# Patient Record
Sex: Male | Born: 1965 | Race: White | Hispanic: No | Marital: Single | Smoking: Former smoker
Health system: Southern US, Community
[De-identification: ages and names within clinical notes are randomized; demographics above are authoritative.]

## PROBLEM LIST (undated history)

## (undated) DIAGNOSIS — R0683 Snoring: Secondary | ICD-10-CM

## (undated) DIAGNOSIS — I639 Cerebral infarction, unspecified: Secondary | ICD-10-CM

## (undated) DIAGNOSIS — I10 Essential (primary) hypertension: Secondary | ICD-10-CM

## (undated) DIAGNOSIS — D649 Anemia, unspecified: Secondary | ICD-10-CM

## (undated) DIAGNOSIS — Z87442 Personal history of urinary calculi: Secondary | ICD-10-CM

## (undated) DIAGNOSIS — M1990 Unspecified osteoarthritis, unspecified site: Secondary | ICD-10-CM

## (undated) DIAGNOSIS — K219 Gastro-esophageal reflux disease without esophagitis: Secondary | ICD-10-CM

## (undated) DIAGNOSIS — I2510 Atherosclerotic heart disease of native coronary artery without angina pectoris: Secondary | ICD-10-CM

## (undated) DIAGNOSIS — Q828 Other specified congenital malformations of skin: Secondary | ICD-10-CM

## (undated) HISTORY — PX: TONSILLECTOMY: SUR1361

## (undated) HISTORY — PX: KNEE SURGERY: SHX244

## (undated) HISTORY — DX: Atherosclerotic heart disease of native coronary artery without angina pectoris: I25.10

---

## 1990-02-04 DIAGNOSIS — I639 Cerebral infarction, unspecified: Secondary | ICD-10-CM

## 1990-02-04 DIAGNOSIS — Q828 Other specified congenital malformations of skin: Secondary | ICD-10-CM

## 1990-02-04 HISTORY — DX: Cerebral infarction, unspecified: I63.9

## 1990-02-04 HISTORY — DX: Other specified congenital malformations of skin: Q82.8

## 2009-04-11 ENCOUNTER — Ambulatory Visit: Payer: Self-pay | Admitting: Family Medicine

## 2009-05-20 DIAGNOSIS — Z709 Sex counseling, unspecified: Secondary | ICD-10-CM | POA: Insufficient documentation

## 2010-07-25 ENCOUNTER — Ambulatory Visit: Payer: Self-pay | Admitting: Family Medicine

## 2010-11-08 ENCOUNTER — Ambulatory Visit: Payer: Self-pay | Admitting: Orthopedic Surgery

## 2010-12-06 ENCOUNTER — Ambulatory Visit: Payer: Self-pay | Admitting: Orthopedic Surgery

## 2012-02-05 DIAGNOSIS — I10 Essential (primary) hypertension: Secondary | ICD-10-CM

## 2012-02-05 HISTORY — DX: Essential (primary) hypertension: I10

## 2013-02-04 DIAGNOSIS — K219 Gastro-esophageal reflux disease without esophagitis: Secondary | ICD-10-CM

## 2013-02-04 HISTORY — DX: Gastro-esophageal reflux disease without esophagitis: K21.9

## 2013-12-20 DIAGNOSIS — R0602 Shortness of breath: Secondary | ICD-10-CM | POA: Insufficient documentation

## 2013-12-20 DIAGNOSIS — R9431 Abnormal electrocardiogram [ECG] [EKG]: Secondary | ICD-10-CM | POA: Insufficient documentation

## 2013-12-20 DIAGNOSIS — R0681 Apnea, not elsewhere classified: Secondary | ICD-10-CM | POA: Insufficient documentation

## 2014-05-12 ENCOUNTER — Ambulatory Visit
Admit: 2014-05-12 | Disposition: A | Discharge status: Home / Self Care | Payer: Self-pay | Attending: Urgent Care | Admitting: Urgent Care

## 2017-02-04 DIAGNOSIS — I219 Acute myocardial infarction, unspecified: Secondary | ICD-10-CM

## 2017-02-04 HISTORY — DX: Acute myocardial infarction, unspecified: I21.9

## 2017-05-25 ENCOUNTER — Other Ambulatory Visit: Payer: Self-pay

## 2017-05-25 ENCOUNTER — Emergency Department: Payer: Self-pay

## 2017-05-25 ENCOUNTER — Inpatient Hospital Stay
Admission: EM | Admit: 2017-05-25 | Discharge: 2017-05-27 | DRG: 280 | Disposition: A | Discharge status: Other Acute Inpatient Hospital | Payer: Self-pay | Attending: Internal Medicine | Admitting: Internal Medicine

## 2017-05-25 ENCOUNTER — Encounter: Payer: Self-pay | Admitting: Emergency Medicine

## 2017-05-25 DIAGNOSIS — I739 Peripheral vascular disease, unspecified: Secondary | ICD-10-CM | POA: Diagnosis present

## 2017-05-25 DIAGNOSIS — I10 Essential (primary) hypertension: Secondary | ICD-10-CM | POA: Diagnosis present

## 2017-05-25 DIAGNOSIS — R000 Tachycardia, unspecified: Secondary | ICD-10-CM | POA: Diagnosis present

## 2017-05-25 DIAGNOSIS — Z6841 Body Mass Index (BMI) 40.0 and over, adult: Secondary | ICD-10-CM

## 2017-05-25 DIAGNOSIS — Z7982 Long term (current) use of aspirin: Secondary | ICD-10-CM

## 2017-05-25 DIAGNOSIS — Q828 Other specified congenital malformations of skin: Secondary | ICD-10-CM

## 2017-05-25 DIAGNOSIS — K219 Gastro-esophageal reflux disease without esophagitis: Secondary | ICD-10-CM | POA: Diagnosis present

## 2017-05-25 DIAGNOSIS — I110 Hypertensive heart disease with heart failure: Secondary | ICD-10-CM | POA: Diagnosis present

## 2017-05-25 DIAGNOSIS — I161 Hypertensive emergency: Secondary | ICD-10-CM

## 2017-05-25 DIAGNOSIS — I214 Non-ST elevation (NSTEMI) myocardial infarction: Principal | ICD-10-CM | POA: Diagnosis present

## 2017-05-25 DIAGNOSIS — R079 Chest pain, unspecified: Secondary | ICD-10-CM

## 2017-05-25 DIAGNOSIS — Z87891 Personal history of nicotine dependence: Secondary | ICD-10-CM

## 2017-05-25 DIAGNOSIS — I5033 Acute on chronic diastolic (congestive) heart failure: Secondary | ICD-10-CM | POA: Diagnosis present

## 2017-05-25 DIAGNOSIS — R9431 Abnormal electrocardiogram [ECG] [EKG]: Secondary | ICD-10-CM

## 2017-05-25 DIAGNOSIS — E785 Hyperlipidemia, unspecified: Secondary | ICD-10-CM | POA: Diagnosis present

## 2017-05-25 DIAGNOSIS — R0683 Snoring: Secondary | ICD-10-CM | POA: Diagnosis present

## 2017-05-25 DIAGNOSIS — Z79899 Other long term (current) drug therapy: Secondary | ICD-10-CM

## 2017-05-25 DIAGNOSIS — I252 Old myocardial infarction: Secondary | ICD-10-CM

## 2017-05-25 DIAGNOSIS — Z8249 Family history of ischemic heart disease and other diseases of the circulatory system: Secondary | ICD-10-CM

## 2017-05-25 DIAGNOSIS — G4730 Sleep apnea, unspecified: Secondary | ICD-10-CM | POA: Diagnosis present

## 2017-05-25 DIAGNOSIS — Z8673 Personal history of transient ischemic attack (TIA), and cerebral infarction without residual deficits: Secondary | ICD-10-CM

## 2017-05-25 DIAGNOSIS — Z880 Allergy status to penicillin: Secondary | ICD-10-CM

## 2017-05-25 HISTORY — DX: Morbid (severe) obesity due to excess calories: E66.01

## 2017-05-25 HISTORY — DX: Other specified congenital malformations of skin: Q82.8

## 2017-05-25 HISTORY — DX: Gastro-esophageal reflux disease without esophagitis: K21.9

## 2017-05-25 HISTORY — DX: Cerebral infarction, unspecified: I63.9

## 2017-05-25 HISTORY — DX: Snoring: R06.83

## 2017-05-25 HISTORY — DX: Essential (primary) hypertension: I10

## 2017-05-25 LAB — CBC WITH DIFFERENTIAL/PLATELET
BASOS ABS: 0 10*3/uL (ref 0–0.1)
Basophils Relative: 0 %
EOS PCT: 2 %
Eosinophils Absolute: 0.1 10*3/uL (ref 0–0.7)
HEMATOCRIT: 44.3 % (ref 40.0–52.0)
Hemoglobin: 15.1 g/dL (ref 13.0–18.0)
LYMPHS PCT: 13 %
Lymphs Abs: 1.1 10*3/uL (ref 1.0–3.6)
MCH: 29.7 pg (ref 26.0–34.0)
MCHC: 34.2 g/dL (ref 32.0–36.0)
MCV: 86.9 fL (ref 80.0–100.0)
Monocytes Absolute: 0.6 10*3/uL (ref 0.2–1.0)
Monocytes Relative: 6 %
NEUTROS ABS: 6.9 10*3/uL — AB (ref 1.4–6.5)
Neutrophils Relative %: 79 %
PLATELETS: 281 10*3/uL (ref 150–440)
RBC: 5.1 MIL/uL (ref 4.40–5.90)
RDW: 13.4 % (ref 11.5–14.5)
WBC: 8.8 10*3/uL (ref 3.8–10.6)

## 2017-05-25 LAB — APTT
APTT: 95 s — AB (ref 24–36)
aPTT: 76 seconds — ABNORMAL HIGH (ref 24–36)

## 2017-05-25 LAB — COMPREHENSIVE METABOLIC PANEL
ALBUMIN: 4.6 g/dL (ref 3.5–5.0)
ALK PHOS: 117 U/L (ref 38–126)
ALT: 43 U/L (ref 17–63)
ANION GAP: 9 (ref 5–15)
AST: 43 U/L — ABNORMAL HIGH (ref 15–41)
BUN: 12 mg/dL (ref 6–20)
CALCIUM: 9 mg/dL (ref 8.9–10.3)
CHLORIDE: 104 mmol/L (ref 101–111)
CO2: 26 mmol/L (ref 22–32)
CREATININE: 1.1 mg/dL (ref 0.61–1.24)
GFR calc Af Amer: >60 mL/min (ref >60)
GFR calc non Af Amer: >60 mL/min (ref >60)
GLUCOSE: 145 mg/dL — AB (ref 65–99)
Potassium: 3.9 mmol/L (ref 3.5–5.1)
SODIUM: 139 mmol/L (ref 135–145)
Total Bilirubin: 0.9 mg/dL (ref 0.3–1.2)
Total Protein: 8.2 g/dL — ABNORMAL HIGH (ref 6.5–8.1)

## 2017-05-25 LAB — HEPARIN LEVEL (UNFRACTIONATED): HEPARIN UNFRACTIONATED: 0.43 [IU]/mL (ref 0.30–0.70)

## 2017-05-25 LAB — LIPID PANEL
CHOL/HDL RATIO: 5.7 ratio
Cholesterol: 215 mg/dL — ABNORMAL HIGH (ref 0–200)
HDL: 38 mg/dL — ABNORMAL LOW (ref >40)
LDL CALC: 146 mg/dL — AB (ref 0–99)
TRIGLYCERIDES: 155 mg/dL — AB (ref <150)
VLDL: 31 mg/dL (ref 0–40)

## 2017-05-25 LAB — BRAIN NATRIURETIC PEPTIDE: B Natriuretic Peptide: 52 pg/mL (ref 0.0–100.0)

## 2017-05-25 LAB — TROPONIN I
Troponin I: 0.04 ng/mL (ref <0.03)
Troponin I: 3.8 ng/mL
Troponin I: 9.17 ng/mL

## 2017-05-25 LAB — PROTIME-INR
INR: 1
Prothrombin Time: 13.1 seconds (ref 11.4–15.2)

## 2017-05-25 MED ORDER — SODIUM CHLORIDE 0.9% FLUSH
3.0000 mL | Freq: Two times a day (BID) | INTRAVENOUS | Status: DC
  Administered 2017-05-25 – 2017-05-26 (×2): 3 mL via INTRAVENOUS

## 2017-05-25 MED ORDER — ASPIRIN 81 MG PO CHEW: 324.0000 mg | CHEWABLE_TABLET | ORAL | Status: AC

## 2017-05-25 MED ORDER — POTASSIUM CHLORIDE 20 MEQ PO PACK: 20.0000 meq | PACK | Freq: Every day | ORAL | Status: DC

## 2017-05-25 MED ORDER — NITROGLYCERIN IN D5W 200-5 MCG/ML-% IV SOLN
0.0000 ug/min | Freq: Once | INTRAVENOUS | Status: AC
Start: 2017-05-25 — End: 2017-05-25
  Administered 2017-05-25: 16.667 ug/min via INTRAVENOUS
  Filled 2017-05-25: qty 250

## 2017-05-25 MED ORDER — FUROSEMIDE 10 MG/ML IJ SOLN
20.0000 mg | Freq: Two times a day (BID) | INTRAMUSCULAR | Status: DC
  Administered 2017-05-25 – 2017-05-26 (×2): 20 mg via INTRAVENOUS
  Filled 2017-05-25 (×2): qty 2

## 2017-05-25 MED ORDER — HYDRALAZINE HCL 20 MG/ML IJ SOLN: 15.0000 mg | INTRAMUSCULAR | Status: DC | PRN

## 2017-05-25 MED ORDER — FENTANYL CITRATE (PF) 100 MCG/2ML IJ SOLN
INTRAMUSCULAR | Status: AC
  Administered 2017-05-25: 100 ug via INTRAVENOUS
  Filled 2017-05-25: qty 2

## 2017-05-25 MED ORDER — NITROGLYCERIN 0.4 MG SL SUBL
0.4000 mg | SUBLINGUAL_TABLET | SUBLINGUAL | Status: DC | PRN
  Administered 2017-05-25 (×2): 0.4 mg via SUBLINGUAL

## 2017-05-25 MED ORDER — LABETALOL HCL 5 MG/ML IV SOLN
5.0000 mg | Freq: Once | INTRAVENOUS | Status: AC
  Administered 2017-05-25: 5 mg via INTRAVENOUS
  Filled 2017-05-25: qty 4

## 2017-05-25 MED ORDER — NITROGLYCERIN 2 % TD OINT
1.0000 [in_us] | TOPICAL_OINTMENT | Freq: Four times a day (QID) | TRANSDERMAL | Status: DC
  Administered 2017-05-25 – 2017-05-26 (×3): 1 [in_us] via TOPICAL
  Filled 2017-05-25 (×3): qty 1

## 2017-05-25 MED ORDER — METOPROLOL TARTRATE 50 MG PO TABS
50.0000 mg | ORAL_TABLET | Freq: Two times a day (BID) | ORAL | Status: DC
  Administered 2017-05-25: 50 mg via ORAL
  Filled 2017-05-25: qty 1

## 2017-05-25 MED ORDER — PANTOPRAZOLE SODIUM 40 MG PO TBEC
40.0000 mg | DELAYED_RELEASE_TABLET | Freq: Every day | ORAL | Status: DC
  Administered 2017-05-25 – 2017-05-26 (×2): 40 mg via ORAL
  Filled 2017-05-25 (×2): qty 1

## 2017-05-25 MED ORDER — ASPIRIN 81 MG PO CHEW
324.0000 mg | CHEWABLE_TABLET | Freq: Once | ORAL | Status: AC
  Administered 2017-05-25: 324 mg via ORAL

## 2017-05-25 MED ORDER — ASPIRIN EC 81 MG PO TBEC
81.0000 mg | DELAYED_RELEASE_TABLET | Freq: Every day | ORAL | Status: DC
  Administered 2017-05-26: 81 mg via ORAL
  Filled 2017-05-25: qty 1

## 2017-05-25 MED ORDER — ACETAMINOPHEN 325 MG PO TABS: 650.0000 mg | ORAL_TABLET | ORAL | Status: DC | PRN

## 2017-05-25 MED ORDER — SODIUM CHLORIDE 0.9 % IV SOLN
INTRAVENOUS | Status: DC
  Administered 2017-05-25: 15:00:00 via INTRAVENOUS

## 2017-05-25 MED ORDER — SIMVASTATIN 20 MG PO TABS
20.0000 mg | ORAL_TABLET | Freq: Every day | ORAL | Status: DC
  Administered 2017-05-25: 20 mg via ORAL
  Filled 2017-05-25: qty 1

## 2017-05-25 MED ORDER — ASPIRIN 300 MG RE SUPP: 300.0000 mg | RECTAL | Status: AC

## 2017-05-25 MED ORDER — HEPARIN (PORCINE) IN NACL 100-0.45 UNIT/ML-% IJ SOLN
1600.0000 [IU]/h | INTRAMUSCULAR | Status: DC
  Administered 2017-05-26: 1600 [IU]/h via INTRAVENOUS
  Filled 2017-05-25 (×3): qty 250

## 2017-05-25 MED ORDER — ONDANSETRON HCL 4 MG/2ML IJ SOLN: 4.0000 mg | Freq: Four times a day (QID) | INTRAMUSCULAR | Status: DC | PRN

## 2017-05-25 MED ORDER — HEPARIN SODIUM (PORCINE) 5000 UNIT/ML IJ SOLN
4000.0000 [IU] | Freq: Once | INTRAMUSCULAR | Status: AC
  Administered 2017-05-25: 4000 [IU] via INTRAVENOUS
  Filled 2017-05-25: qty 1

## 2017-05-25 MED ORDER — ACETAMINOPHEN 500 MG PO TABS: 1000.0000 mg | ORAL_TABLET | Freq: Once | ORAL | Status: DC

## 2017-05-25 MED ORDER — IOPAMIDOL (ISOVUE-370) INJECTION 76%
100.0000 mL | Freq: Once | INTRAVENOUS | Status: AC | PRN
  Administered 2017-05-25: 100 mL via INTRAVENOUS

## 2017-05-25 MED ORDER — FENTANYL CITRATE (PF) 100 MCG/2ML IJ SOLN
100.0000 ug | INTRAMUSCULAR | Status: DC | PRN
  Administered 2017-05-25: 100 ug via INTRAVENOUS

## 2017-05-25 MED ORDER — LISINOPRIL 20 MG PO TABS
20.0000 mg | ORAL_TABLET | Freq: Two times a day (BID) | ORAL | Status: DC
  Administered 2017-05-25: 20 mg via ORAL
  Filled 2017-05-25: qty 1

## 2017-05-25 MED ORDER — ENOXAPARIN SODIUM 40 MG/0.4ML ~~LOC~~ SOLN: 40.0000 mg | SUBCUTANEOUS | Status: DC

## 2017-05-25 MED ORDER — ALPRAZOLAM 0.25 MG PO TABS: 0.2500 mg | ORAL_TABLET | Freq: Two times a day (BID) | ORAL | Status: DC | PRN

## 2017-05-25 MED ORDER — HEPARIN (PORCINE) IN NACL 100-0.45 UNIT/ML-% IJ SOLN
14.0000 [IU]/kg/h | INTRAMUSCULAR | Status: DC
  Administered 2017-05-25: 14 [IU]/kg/h via INTRAVENOUS

## 2017-05-25 NOTE — ED Notes (Signed)
Late entry: at 1710 report called to [facility]. Nitro at 28ml/hr changed by dr Roxan Hockey at approx 1600 per dr. Roxan Hockey for the pt's continued high blood pressure.

## 2017-05-25 NOTE — Progress Notes (Signed)
ANTICOAGULATION CONSULT NOTE - Initial Consult  Pharmacy Consult for heparin gtt Indication: chest pain/ACS  Allergies  Allergen Reactions  . Penicillins Other (See Comments)    Has patient had a PCN reaction causing immediate rash, facial/tongue/throat swelling, SOB or lightheadedness with hypotension: Unknown Has patient had a PCN reaction causing severe rash involving mucus membranes or skin necrosis: Unknown Has patient had a PCN reaction that required hospitalization: Unknown Has patient had a PCN reaction occurring within the last 10 years: Unknown If all of the above answers are "NO", then may proceed with Cephalosporin use.     Patient Measurements: Height: 5\' 10"  (177.8 cm) Weight: 299 lb 11.2 oz (135.9 kg) IBW/kg (Calculated) : 73 Heparin Dosing Weight: 104kg  Vital Signs: Temp: 97.8 F (36.6 C) (04/21 2002) Temp Source: Oral (04/21 2002) BP: 116/66 (04/21 2002) Pulse Rate: 76 (04/21 2002)  Labs: Recent Labs    05/25/17 1355 05/25/17 1507 05/25/17 1735 05/25/17 2207  HGB  --  15.1  --   --   HCT  --  44.3  --   --   PLT  --  281  --   --   APTT  --  95*  --  76*  LABPROT  --  13.1  --   --   INR  --  1.00  --   --   HEPARINUNFRC  --   --   --  0.43  CREATININE 1.10  --   --   --   TROPONINI 0.04*  --  3.80* 9.17*    Estimated Creatinine Clearance: 109.1 mL/min (by C-G formula based on SCr of 1.1 mg/dL).   Medical History: Past Medical History:  Diagnosis Date  . PXE (pseudoxanthoma elasticum)     Medications:  Medications Prior to Admission  Medication Sig Dispense Refill Last Dose  . aspirin EC 81 MG tablet Take 81 mg by mouth once.   05/25/2017 at 1300  . omeprazole (PRILOSEC) 20 MG capsule Take 20 mg by mouth daily as needed (acid reflux.).   PRN at PRN  . ranitidine (ZANTAC) 150 MG tablet Take 150 mg by mouth 2 (two) times daily.   05/25/2017 at 0800   Scheduled:  . acetaminophen  1,000 mg Oral Once  . aspirin  324 mg Oral NOW   Or  .  aspirin  300 mg Rectal NOW  . [START ON 05/26/2017] aspirin EC  81 mg Oral Daily  . furosemide  20 mg Intravenous Q12H  . lisinopril  20 mg Oral BID  . metoprolol tartrate  50 mg Oral BID  . nitroGLYCERIN  1 inch Topical Q6H  . pantoprazole  40 mg Oral Daily  . [START ON 05/26/2017] potassium chloride  20 mEq Oral Daily  . simvastatin  20 mg Oral q1800  . sodium chloride flush  3 mL Intravenous Q12H   Infusions:  . sodium chloride 20 mL/hr at 05/25/17 1511  . heparin 1,400 Units/hr (05/25/17 1736)   PRN: acetaminophen, ALPRAZolam, fentaNYL (SUBLIMAZE) injection, hydrALAZINE, nitroGLYCERIN, ondansetron (ZOFRAN) IV Anti-infectives (From admission, onward)   None      Assessment: 52 year old male with ACS requiring heparin gtt. Patient does not take any anticoagulants PTA however elevated apTT which was likely due to heparin bolus given at 1428. However will get aptt with evening HL to verify.   Goal of Therapy:  Heparin level 0.3-0.7 units/ml Monitor platelets by anticoagulation protocol: Yes   Plan:  Give 4000 units bolus x 1 Start  heparin infusion at 1400 units/hr Check anti-Xa level in 6 hours and daily while on heparin Continue to monitor H&H and platelets   4/21 PM heparin level 0.43, aPTT 76. Continue current regimen. Recheck heparin level and CBC with tomorrow AM labs.  Joselyne Spake S 05/25/2017,11:06 PM

## 2017-05-25 NOTE — ED Provider Notes (Signed)
[facility] Emergency Department Provider Note    None    (approximate)  I have reviewed the triage vital signs and the nursing notes.   HISTORY  Chief Complaint Code STEMI    HPI Jon Garner. is a 52 y.o. 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  Has never had symptoms like this before.  Denies any recent fevers.  No numbness or tingling.  No pain radiating through to his back.  States that the pain is mild to moderate.  Past Medical History:  Diagnosis Date  . PXE (pseudoxanthoma elasticum)    No family history on file. Past Surgical History:  Procedure Laterality Date  . KNEE SURGERY     There are no active problems to display for this patient.     Prior to Admission medications   Medication Sig Start Date End Date Taking? Authorizing Provider  aspirin EC 81 MG tablet Take 81 mg by mouth once.   Yes [provider]  omeprazole (PRILOSEC) 20 MG capsule Take 20 mg by mouth daily as needed (acid reflux.).   Yes [provider]  ranitidine (ZANTAC) 150 MG tablet Take 150 mg by mouth 2 (two) times daily.   Yes [provider]    Allergies Penicillins    Social History Social History   Tobacco Use  . Smoking status: Former Games developer  . Smokeless tobacco: Never Used  Substance Use Topics  . Alcohol use: Yes    Comment: Rare  . Drug use: Never    Review of Systems Patient denies headaches, rhinorrhea, blurry vision, numbness, shortness of breath, chest pain, edema, cough, abdominal pain, nausea, vomiting, diarrhea, dysuria, fevers, rashes or hallucinations unless otherwise stated above in  HPI. ____________________________________________   PHYSICAL EXAM:  VITAL SIGNS: Vitals:   05/25/17 1508 05/25/17 1515  BP: (!) 180/111 (!) 191/103  Pulse: 99 100  Resp: 17 15  SpO2: 95% (!) 87%    Constitutional: Alert and oriented. critically ill appearing and diaphoretic Eyes: Conjunctivae are normal.  Head: Atraumatic. Nose: No congestion/rhinnorhea. Mouth/Throat: Mucous membranes are moist.   Neck: No stridor. Painless ROM.  Cardiovascular: Normal rate, regular rhythm. Grossly normal heart sounds.  Good peripheral circulation. Respiratory: Normal respiratory effort.  No retractions. Lungs CTAB. Gastrointestinal: Soft and nontender. No distention. No abdominal bruits. No CVA tenderness. Genitourinary:  Musculoskeletal: No lower extremity tenderness nor edema.  No joint effusions. Neurologic:  Normal speech and language. No gross focal neurologic deficits are appreciated. No facial droop Skin:  Skin is warm, dry and intact. No rash noted. Psychiatric: anxious appearing   ____________________________________________   LABS (all labs ordered are listed, but only abnormal results are displayed)  Results for orders placed or performed during the hospital encounter of 05/25/17 (from the past 24 hour(s))  Comprehensive metabolic panel     Status: Abnormal   Collection Time: 05/25/17  1:55 PM  Result Value Ref Range   Sodium 139 135 - 145 mmol/L   Potassium 3.9 3.5 - 5.1 mmol/L   Chloride 104 101 - 111 mmol/L   CO2 26 22 - 32 mmol/L   Glucose, Bld 145 (H) 65 - 99 mg/dL   BUN 12 6 - 20 mg/dL   Creatinine, Ser 3.84 0.61 -  1.24 mg/dL   Calcium 9.0 8.9 - 16.1 mg/dL   Total Protein 8.2 (H) 6.5 - 8.1 g/dL   Albumin 4.6 3.5 - 5.0 g/dL   AST 43 (H) 15 - 41 U/L   ALT 43 17 - 63 U/L   Alkaline Phosphatase 117 38 - 126 U/L   Total Bilirubin 0.9 0.3 - 1.2 mg/dL   GFR calc non Af Amer >60 >60 mL/min   GFR calc Af Amer >60 >60 mL/min   Anion gap 9 5 - 15  Troponin I     Status:  Abnormal   Collection Time: 05/25/17  1:55 PM  Result Value Ref Range   Troponin I 0.04 (HH) <0.03 ng/mL  Lipid panel     Status: Abnormal   Collection Time: 05/25/17  1:55 PM  Result Value Ref Range   Cholesterol 215 (H) 0 - 200 mg/dL   Triglycerides 096 (H) <150 mg/dL   HDL 38 (L) >04 mg/dL   Total CHOL/HDL Ratio 5.7 RATIO   VLDL 31 0 - 40 mg/dL   LDL Cholesterol 540 (H) 0 - 99 mg/dL   ____________________________________________  EKG My review and personal interpretation at Time: 14:01   Indication: chest pain  Rate: 115  Rhythm: sinus Axis: normal Other: ST elevation in aVr>V1 with diffuse st depressions   My review and personal interpretation at Time: 14:02 Indication: chest pain  Rate: 115  Rhythm: sinus Axis: normal Other: ST elevation in aVr>V1 with diffuse st depressions  My review and personal interpretation at Time:   14:19 Indication: chest pain  Rate: 115  Rhythm: sinus Axis: normal Other: posterior leads show no STEMI  ____________________________________________  RADIOLOGY  I personally reviewed all radiographic images ordered to evaluate for the above acute complaints and reviewed radiology reports and findings.  These findings were personally discussed with the patient.  Please see medical record for radiology report.  ____________________________________________   PROCEDURES  Procedure(s) performed:  .Critical Care Performed by: Willy Eddy, MD Authorized by: Willy Eddy, MD   Critical care provider statement:    Critical care time (minutes):  40   Critical care time was exclusive of:  Separately billable procedures and treating other patients   Critical care was necessary to treat or prevent imminent or life-threatening deterioration of the following conditions:  Cardiac failure   Critical care was time spent personally by me on the following activities:  Development of treatment plan with patient or surrogate, discussions with  consultants, evaluation of patient's response to treatment, examination of patient, obtaining history from patient or surrogate, ordering and performing treatments and interventions, ordering and review of laboratory studies, ordering and review of radiographic studies, pulse oximetry, re-evaluation of patient's condition and review of old charts      Critical Care performed: yes ____________________________________________   INITIAL IMPRESSION / ASSESSMENT AND PLAN / ED COURSE  Pertinent labs & imaging results that were available during my care of the patient were reviewed by me and considered in my medical decision making (see chart for details).  DDX: ACS, pericarditis, esophagitis, boerhaaves, pe, dissection, pna, bronchitis, costochondritis   Jon Garner. is a 52 y.o. who presents to the ED with chest pain as described above.  Patient without any pain ripping or tearing through his back but the patient is diaphoretic and profoundly hypertensive.  EKG with concerning ST changes but no true STEMI criteria at this time.  We will treat blood pressure as well as order CT angiogram to rule  out dissection.  Will provide IV pain medication.  The patient will be placed on continuous pulse oximetry and telemetry for monitoring.  Laboratory evaluation will be sent to evaluate for the above complaints.     Clinical Course as of May 25 1521  Sun May 25, 2017  1427 Patient with markedly elevated blood pressure.  Posterior leads show no evidence of ST elevation.  Will continue with IV blood pressure management will order CT angiogram to evaluate for dissection.   [PR]  1454 CT angiogram does not show any evidence of dissection.  Pain improving with blood pressure control and IV pain medication.   [PR]  1506 Patient reassessed and is currently pain-free.  BP improving on nitro drip.  No longer diaphoretic   [PR]  1515 Discussed case with Dr. Graciela Husbands of cardiology who agrees with current  medical therapy at this point.  Patient was remains pain-free.  No evidence of dissection.  Most likely symptoms secondary to long-standing hypertension with acute hypertensive emergency.  Patient will likely require cardiac catheterization to evaluate for underlying CAD but does not currently meet criteria for emergent catheterization.    Have discussed with the patient and available family all diagnostics and treatments performed thus far and all questions were answered to the best of my ability. The patient demonstrates understanding and agreement with plan.    [PR]  1520 APTT [PR]    Clinical Course User Index [PR] Willy Eddy, MD     As part of my medical decision making, I reviewed the following data within the electronic MEDICAL RECORD NUMBER Nursing notes reviewed and incorporated, Labs reviewed, notes from prior ED visits.  ____________________________________________   FINAL CLINICAL IMPRESSION(S) / ED DIAGNOSES  Final diagnoses:  Hypertensive emergency  Abnormal EKG      NEW MEDICATIONS STARTED DURING THIS VISIT:  New Prescriptions   No medications on file     Note:  This document was prepared using Dragon voice recognition software and may include unintentional dictation errors.    Willy Eddy, MD 05/25/17 [phone]

## 2017-05-25 NOTE — H&P (Addendum)
[facility] - [place] [facility]   PATIENT NAME: Jon Garner    MR#:  [id number]  DATE OF BIRTH:  1965/09/14  DATE OF ADMISSION:  05/25/2017  PRIMARY CARE PHYSICIAN: Patient, No Pcp Per   REQUESTING/REFERRING PHYSICIAN:   CHIEF COMPLAINT:   Chief Complaint  Patient presents with  . Code STEMI    HISTORY OF PRESENT ILLNESS: Jon Garner  is a 52 y.o. 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  Old inferior MI, patient evaluated emergency room, continues to complain of chest pain-down to 1-2 out of 10 from 10 out of 10, patient is now been admitted for acute malignant hypertension, acute elevated troponins for which non-STEMI cannot be ruled out.  PAST MEDICAL HISTORY:   Past Medical History:  Diagnosis Date  . PXE (pseudoxanthoma elasticum)     PAST SURGICAL HISTORY:  Past Surgical History:  Procedure Laterality Date  . KNEE SURGERY      SOCIAL HISTORY:  Social History   Tobacco Use  . Smoking status: Former Games developer  . Smokeless tobacco: Never Used  Substance Use Topics  . Alcohol use: Yes    Comment: Rare    FAMILY HISTORY:  HTN CAD  DRUG ALLERGIES:  Allergies  Allergen Reactions  . Penicillins Other (See Comments)    Has patient had a PCN reaction causing  immediate rash, facial/tongue/throat swelling, SOB or lightheadedness with hypotension: Unknown Has patient had a PCN reaction causing severe rash involving mucus membranes or skin necrosis: Unknown Has patient had a PCN reaction that required hospitalization: Unknown Has patient had a PCN reaction occurring within the last 10 years: Unknown If all of the above answers are "NO", then may proceed with Cephalosporin use.     REVIEW OF SYSTEMS:   CONSTITUTIONAL: No fever, +fatigue, weakness.  EYES: No blurred or double vision.  EARS, NOSE, AND THROAT: No tinnitus or ear pain.  RESPIRATORY: No cough, +shortness of breath, no wheezing or hemoptysis.  CARDIOVASCULAR: + chest pain, no orthopnea, edema.  GASTROINTESTINAL: + nausea, no vomiting, diarrhea or abdominal pain.  GENITOURINARY: No dysuria, hematuria.  ENDOCRINE: No polyuria, nocturia,  HEMATOLOGY: No anemia, easy bruising or bleeding SKIN: No rash or lesion. MUSCULOSKELETAL: No joint pain or arthritis.   NEUROLOGIC: No tingling, numbness, weakness.  PSYCHIATRY: No anxiety or depression.   MEDICATIONS AT HOME:  Prior to Admission medications   Medication Sig Start Date End Date Taking? Authorizing Provider  aspirin EC 81 MG tablet Take 81 mg by mouth once.   Yes [provider]  omeprazole (PRILOSEC) 20 MG capsule Take 20 mg by mouth daily as needed (acid reflux.).   Yes [provider]  ranitidine (ZANTAC) 150 MG tablet Take 150 mg by mouth 2 (two) times daily.   Yes [provider]      PHYSICAL EXAMINATION:   VITAL SIGNS: Blood pressure (!) 191/103, pulse 100, resp. rate 15, height 5\' 10"  (1.778 m), weight 136.1 kg (300 lb), SpO2 Marland Kitchen)  87 %.  GENERAL:  52 y.o.-year-old patient lying in the bed with no acute distress.  Extreme morbid obesity EYES: Pupils equal, round, reactive to light and accommodation. No scleral icterus. Extraocular muscles intact.  HEENT: Head atraumatic, normocephalic.  Oropharynx and nasopharynx clear.  NECK:  Supple, no jugular venous distention. No thyroid enlargement, no tenderness.  LUNGS: Normal breath sounds bilaterally, no wheezing, rales,rhonchi or crepitation. No use of accessory muscles of respiration.  CARDIOVASCULAR: S1, S2 normal. No murmurs, rubs, or gallops.  ABDOMEN: Soft, nontender, nondistended. Bowel sounds present. No organomegaly or mass.  EXTREMITIES: No pedal edema, cyanosis, or clubbing.  NEUROLOGIC: Cranial nerves II through XII are intact. MAES. Gait not checked.  PSYCHIATRIC: The patient is alert and oriented x 3.  SKIN: No obvious rash, lesion, or ulcer.   LABORATORY PANEL:   CBC Recent Labs  Lab 05/25/17 1507  WBC 8.8  HGB 15.1  HCT 44.3  PLT 281  MCV 86.9  MCH 29.7  MCHC 34.2  RDW 13.4  LYMPHSABS 1.1  MONOABS 0.6  EOSABS 0.1  BASOSABS 0.0   ------------------------------------------------------------------------------------------------------------------  Chemistries  Recent Labs  Lab 05/25/17 1355  NA 139  K 3.9  CL 104  CO2 26  GLUCOSE 145*  BUN 12  CREATININE 1.10  CALCIUM 9.0  AST 43*  ALT 43  ALKPHOS 117  BILITOT 0.9   ------------------------------------------------------------------------------------------------------------------ estimated creatinine clearance is 109.1 mL/min (by C-G formula based on SCr of 1.1 mg/dL). ------------------------------------------------------------------------------------------------------------------ No results for input(s): TSH, T4TOTAL, T3FREE, THYROIDAB in the last 72 hours.  Invalid input(s): FREET3   Coagulation profile Recent Labs  Lab 05/25/17 1507  INR 1.00   ------------------------------------------------------------------------------------------------------------------- No results for input(s): DDIMER in the last 72  hours. -------------------------------------------------------------------------------------------------------------------  Cardiac Enzymes Recent Labs  Lab 05/25/17 1355  TROPONINI 0.04*   ------------------------------------------------------------------------------------------------------------------ Invalid input(s): POCBNP  ---------------------------------------------------------------------------------------------------------------  Urinalysis No results found for: COLORURINE, APPEARANCEUR, LABSPEC, PHURINE, GLUCOSEU, HGBUR, BILIRUBINUR, KETONESUR, PROTEINUR, UROBILINOGEN, NITRITE, LEUKOCYTESUR   RADIOLOGY: Dg Chest Port 1 View  Result Date: 05/25/2017 CLINICAL DATA:  Sudden onset severe chest pain.  Ex-smoker. EXAM: PORTABLE CHEST 1 VIEW COMPARISON:  04/11/2009. FINDINGS: Interval mildly enlarged cardiac silhouette and mild prominence of the pulmonary vasculature and interstitial markings. No visible pleural fluid. No acute bony abnormality. IMPRESSION: Interval mild cardiomegaly and mild changes of congestive heart failure. Electronically Signed   By: Beckie Salts M.D.   On: 05/25/2017 14:43   Ct Angio Chest Aorta W And/or Wo Contrast  Result Date: 05/25/2017 CLINICAL DATA:  Sudden onset severe chest pain. Severe diaphoresis. Ex-smoker. EXAM: CT ANGIOGRAPHY CHEST WITH CONTRAST TECHNIQUE: Multidetector CT imaging of the chest was performed using the standard protocol during bolus administration of intravenous contrast. Multiplanar CT image reconstructions and MIPs were obtained to evaluate the vascular anatomy. CONTRAST:  ISOVUE-370 IOPAMIDOL (ISOVUE-370) INJECTION 76% COMPARISON:  Portable chest radiograph obtained today. FINDINGS: Cardiovascular: Satisfactory opacification of the pulmonary arteries to the segmental level. No evidence of pulmonary embolism. Normal heart size. No pericardial effusion. No aortic dissection or aneurysm. Multi-vessel coronary artery calcification.  Mediastinum/Nodes: No enlarged mediastinal, hilar, or axillary lymph nodes. Thyroid gland, trachea, and esophagus demonstrate no significant findings. Lungs/Pleura: Clear lungs. No pleural fluid. No nodules. Minimally prominent interstitial markings with peripheral Kerley lines bilaterally. Upper Abdomen: Diffuse low density of the liver relative to the spleen. Musculoskeletal: Extensive thoracic and lower cervical spine degenerative changes. Review of the MIP images confirms the above findings. IMPRESSION: 1. No pulmonary emboli, aortic dissection or aortic  aneurysm. 2. Multi-vessel atheromatous coronary artery calcifications. 3. Minimal interstitial pulmonary edema/chronic interstitial lung disease. 4. Diffuse hepatic steatosis. Electronically Signed   By: Beckie Salts M.D.   On: 05/25/2017 15:10    EKG: Orders placed or performed during the hospital encounter of 05/25/17  . EKG 12-Lead  . EKG 12-Lead  . EKG 12-Lead  . EKG 12-Lead  . ED EKG  . ED EKG    IMPRESSION AND PLAN: 1 acute malignant hypertension Improved with IV fentanyl/IV Lasix/IV labetalol in the emergency room Start Lopressor twice daily, lisinopril twice daily, IV hydralazine as needed systolic blood pressure greater than 150, scheduled Nitropaste to chest, vitals per routine, make changes as per necessary  2 acute elevated troponins/possible non-STEMI with flash pulmonary edema May represent demand ischemia from above Admit to telemetry bed on our ACS protocol, continue heparin drip, continue to cycle cardiac enzymes, cardiology to see, Lopressor twice daily, lisinopril, aspirin, statin therapy-lipids noted, supplemental oxygen as needed, IV morphine as needed breakthrough pain, echocardiogram, if rules out will proceed with nuclear medicine stress testing in the morning, good blood pressure control per above, and continue close medical monitoring  3 acute newly diagnosed congestive heart failure exacerbation Most likely  secondary to diastolic dysfunction, exacerbated by above IV Lasix twice daily, lisinopril, aspirin, Lopressor, low-sodium/cardiac diet, good blood pressure control per above, echocardiogram, cardiology to see  4 chronic extreme morbid obesity Lifestyle modification recommended  5 hyperlipidemia with peripheral vascular disease Start statin therapy   6 chronic peripheral vascular disease Stable Noted history of chronic left ICA occlusion, occluded right side intracranial vessel-unknown location Aspirin and statin therapy as stated above    All the records are reviewed and case discussed with ED provider. Management plans discussed with the patient, family and they are in agreement.  CODE STATUS:full    TOTAL TIME TAKING CARE OF THIS PATIENT: 45 minutes of critical care time was used to discuss plan of care with subspecialty staff, nursing staff, the patient with all questions answered.    Evelena Asa Teofila Bowery M.D on 05/25/2017   Between 7am to 6pm - Pager - [phone]  After 6pm go to [email or web address] - password [email or web address]  [facility] [place] [facility]  Office  [phone]  CC: Primary care physician; Patient, No Pcp Per   Note: This dictation was prepared with Dragon dictation along with smaller phrase technology. Any transcriptional errors that result from this process are unintentional.

## 2017-05-25 NOTE — ED Notes (Signed)
Pt back from ct

## 2017-05-25 NOTE — Progress Notes (Signed)
ANTICOAGULATION CONSULT NOTE - Initial Consult  Pharmacy Consult for heparin gtt Indication: chest pain/ACS  Allergies  Allergen Reactions  . Penicillins Other (See Comments)    Has patient had a PCN reaction causing immediate rash, facial/tongue/throat swelling, SOB or lightheadedness with hypotension: Unknown Has patient had a PCN reaction causing severe rash involving mucus membranes or skin necrosis: Unknown Has patient had a PCN reaction that required hospitalization: Unknown Has patient had a PCN reaction occurring within the last 10 years: Unknown If all of the above answers are "NO", then may proceed with Cephalosporin use.     Patient Measurements: Height: 5\' 10"  (177.8 cm) Weight: 300 lb (136.1 kg) IBW/kg (Calculated) : 73 Heparin Dosing Weight: 104kg  Vital Signs: BP: 191/103 (04/21 1515) Pulse Rate: 100 (04/21 1515)  Labs: Recent Labs    05/25/17 1355 05/25/17 1507  HGB  --  15.1  HCT  --  44.3  PLT  --  281  APTT  --  95*  LABPROT  --  13.1  INR  --  1.00  CREATININE 1.10  --   TROPONINI 0.04*  --     Estimated Creatinine Clearance: 109.1 mL/min (by C-G formula based on SCr of 1.1 mg/dL).   Medical History: Past Medical History:  Diagnosis Date  . PXE (pseudoxanthoma elasticum)     Medications:   (Not in a hospital admission) Scheduled:  . acetaminophen  1,000 mg Oral Once  . labetalol  5 mg Intravenous Once   Infusions:  . sodium chloride 20 mL/hr at 05/25/17 1511  . heparin     PRN: fentaNYL (SUBLIMAZE) injection, nitroGLYCERIN Anti-infectives (From admission, onward)   None      Assessment: 52 year old male with ACS requiring heparin gtt. Patient does not take any anticoagulants PTA however elevated apTT which was likely due to heparin bolus given at 1428. However will get aptt with evening HL to verify.   Goal of Therapy:  Heparin level 0.3-0.7 units/ml Monitor platelets by anticoagulation protocol: Yes   Plan:  Give 4000  units bolus x 1 Start heparin infusion at 1400 units/hr Check anti-Xa level in 6 hours and daily while on heparin Continue to monitor H&H and platelets  Gerre Pebbles Gergory Biello 05/25/2017,3:54 PM

## 2017-05-25 NOTE — ED Triage Notes (Signed)
Pt presents to ED via POV with c/o sudden onset severe chest pain, upon arrival to ED pt is noted to be severely diaphoretic. Pt taken to triage 2 by this RN upon his arrival to ED, code STEMI called by Dr. Roxan Hockey.

## 2017-05-26 ENCOUNTER — Inpatient Hospital Stay (HOSPITAL_COMMUNITY)
Admit: 2017-05-26 | Discharge: 2017-05-26 | Disposition: A | Discharge status: Home / Self Care | Payer: Self-pay | Attending: Cardiovascular Disease | Admitting: Cardiovascular Disease

## 2017-05-26 ENCOUNTER — Encounter
Admission: EM | Disposition: A | Discharge status: Other Acute Inpatient Hospital | Payer: Self-pay | Source: Home / Self Care | Attending: Internal Medicine

## 2017-05-26 ENCOUNTER — Encounter: Payer: Self-pay | Admitting: *Deleted

## 2017-05-26 DIAGNOSIS — I10 Essential (primary) hypertension: Secondary | ICD-10-CM

## 2017-05-26 DIAGNOSIS — I214 Non-ST elevation (NSTEMI) myocardial infarction: Secondary | ICD-10-CM

## 2017-05-26 DIAGNOSIS — F17200 Nicotine dependence, unspecified, uncomplicated: Secondary | ICD-10-CM

## 2017-05-26 DIAGNOSIS — I2510 Atherosclerotic heart disease of native coronary artery without angina pectoris: Secondary | ICD-10-CM

## 2017-05-26 HISTORY — PX: LEFT HEART CATH AND CORONARY ANGIOGRAPHY: CATH118249

## 2017-05-26 LAB — CBC
HEMATOCRIT: 40.7 % (ref 40.0–52.0)
HEMOGLOBIN: 14.1 g/dL (ref 13.0–18.0)
MCH: 30.4 pg (ref 26.0–34.0)
MCHC: 34.6 g/dL (ref 32.0–36.0)
MCV: 87.8 fL (ref 80.0–100.0)
Platelets: 270 10*3/uL (ref 150–440)
RBC: 4.64 MIL/uL (ref 4.40–5.90)
RDW: 13.5 % (ref 11.5–14.5)
WBC: 10.3 10*3/uL (ref 3.8–10.6)

## 2017-05-26 LAB — BASIC METABOLIC PANEL
Anion gap: 8 (ref 5–15)
BUN: 12 mg/dL (ref 6–20)
CALCIUM: 8.1 mg/dL — AB (ref 8.9–10.3)
CHLORIDE: 106 mmol/L (ref 101–111)
CO2: 24 mmol/L (ref 22–32)
CREATININE: 0.99 mg/dL (ref 0.61–1.24)
GFR calc Af Amer: >60 mL/min (ref >60)
GFR calc non Af Amer: >60 mL/min (ref >60)
Glucose, Bld: 118 mg/dL — ABNORMAL HIGH (ref 65–99)
Potassium: 3.5 mmol/L (ref 3.5–5.1)
Sodium: 138 mmol/L (ref 135–145)

## 2017-05-26 LAB — ECHOCARDIOGRAM COMPLETE
Height: 70 in
Weight: 4729.6 oz

## 2017-05-26 LAB — HEPARIN LEVEL (UNFRACTIONATED): HEPARIN UNFRACTIONATED: 0.22 [IU]/mL — AB (ref 0.30–0.70)

## 2017-05-26 LAB — TROPONIN I: Troponin I: 8.21 ng/mL (ref <0.03)

## 2017-05-26 SURGERY — LEFT HEART CATH AND CORONARY ANGIOGRAPHY
Anesthesia: Moderate Sedation

## 2017-05-26 MED ORDER — IOPAMIDOL (ISOVUE-300) INJECTION 61%
INTRAVENOUS | Status: DC | PRN
  Administered 2017-05-26: 120 mL via INTRA_ARTERIAL

## 2017-05-26 MED ORDER — SODIUM CHLORIDE 0.9 % IV SOLN: 250.0000 mL | INTRAVENOUS | Status: DC | PRN

## 2017-05-26 MED ORDER — MIDAZOLAM HCL 2 MG/2ML IJ SOLN
INTRAMUSCULAR | Status: DC | PRN
  Administered 2017-05-26: 1 mg via INTRAVENOUS

## 2017-05-26 MED ORDER — NITROGLYCERIN 2 % TD OINT
TOPICAL_OINTMENT | TRANSDERMAL | Status: DC | PRN
  Administered 2017-05-26: 2 [in_us] via TOPICAL

## 2017-05-26 MED ORDER — LISINOPRIL 20 MG PO TABS
20.0000 mg | ORAL_TABLET | Freq: Every day | ORAL | Status: DC
  Administered 2017-05-26: 20 mg via ORAL
  Filled 2017-05-26: qty 1

## 2017-05-26 MED ORDER — FENTANYL CITRATE (PF) 100 MCG/2ML IJ SOLN
INTRAMUSCULAR | Status: AC
  Filled 2017-05-26: qty 2

## 2017-05-26 MED ORDER — POTASSIUM CHLORIDE CRYS ER 20 MEQ PO TBCR
20.0000 meq | EXTENDED_RELEASE_TABLET | Freq: Every day | ORAL | Status: DC
  Administered 2017-05-26: 20 meq via ORAL
  Filled 2017-05-26: qty 1

## 2017-05-26 MED ORDER — SODIUM CHLORIDE 0.9% FLUSH: 3.0000 mL | Freq: Two times a day (BID) | INTRAVENOUS | Status: DC

## 2017-05-26 MED ORDER — HEPARIN BOLUS VIA INFUSION
1500.0000 [IU] | Freq: Once | INTRAVENOUS | Status: AC
  Administered 2017-05-26: 1500 [IU] via INTRAVENOUS
  Filled 2017-05-26: qty 1500

## 2017-05-26 MED ORDER — ATORVASTATIN CALCIUM 20 MG PO TABS
80.0000 mg | ORAL_TABLET | Freq: Every day | ORAL | Status: DC
  Administered 2017-05-26: 80 mg via ORAL
  Filled 2017-05-26 (×2): qty 4

## 2017-05-26 MED ORDER — HEPARIN (PORCINE) IN NACL 1000-0.9 UT/500ML-% IV SOLN
INTRAVENOUS | Status: AC
  Filled 2017-05-26: qty 1000

## 2017-05-26 MED ORDER — NITROGLYCERIN 2 % TD OINT
TOPICAL_OINTMENT | TRANSDERMAL | Status: AC
  Filled 2017-05-26: qty 2

## 2017-05-26 MED ORDER — LISINOPRIL 20 MG PO TABS: 20.0000 mg | ORAL_TABLET | Freq: Every day | ORAL | Status: DC

## 2017-05-26 MED ORDER — MIDAZOLAM HCL 2 MG/2ML IJ SOLN
INTRAMUSCULAR | Status: AC
Start: 2017-05-26 — End: 2017-05-26
  Filled 2017-05-26: qty 2

## 2017-05-26 MED ORDER — METOPROLOL TARTRATE 25 MG PO TABS
25.0000 mg | ORAL_TABLET | Freq: Two times a day (BID) | ORAL | Status: DC
  Administered 2017-05-26 (×2): 25 mg via ORAL
  Filled 2017-05-26 (×2): qty 1

## 2017-05-26 MED ORDER — SODIUM CHLORIDE 0.9% FLUSH: 3.0000 mL | INTRAVENOUS | Status: DC | PRN

## 2017-05-26 MED ORDER — HEPARIN (PORCINE) IN NACL 100-0.45 UNIT/ML-% IJ SOLN
1600.0000 [IU]/h | INTRAMUSCULAR | Status: DC
  Administered 2017-05-26: 1600 [IU]/h via INTRAVENOUS

## 2017-05-26 MED ORDER — LIDOCAINE HCL (PF) 1 % IJ SOLN
INTRAMUSCULAR | Status: AC
  Filled 2017-05-26: qty 30

## 2017-05-26 MED ORDER — SODIUM CHLORIDE 0.9 % WEIGHT BASED INFUSION
3.0000 mL/kg/h | INTRAVENOUS | Status: DC
  Administered 2017-05-26: 3 mL/kg/h via INTRAVENOUS

## 2017-05-26 MED ORDER — SODIUM CHLORIDE 0.9 % WEIGHT BASED INFUSION: 1.0000 mL/kg/h | INTRAVENOUS | Status: DC

## 2017-05-26 MED ORDER — METOPROLOL TARTRATE 25 MG PO TABS: 25.0000 mg | ORAL_TABLET | Freq: Two times a day (BID) | ORAL | Status: DC

## 2017-05-26 MED ORDER — ACETAMINOPHEN 325 MG PO TABS: 650.0000 mg | ORAL_TABLET | ORAL | Status: DC | PRN

## 2017-05-26 MED ORDER — SODIUM CHLORIDE 0.9 % WEIGHT BASED INFUSION
1.0000 mL/kg/h | INTRAVENOUS | Status: AC
  Administered 2017-05-26: 1 mL/kg/h via INTRAVENOUS

## 2017-05-26 MED ORDER — ONDANSETRON HCL 4 MG/2ML IJ SOLN: 4.0000 mg | Freq: Four times a day (QID) | INTRAMUSCULAR | Status: DC | PRN

## 2017-05-26 MED ORDER — POTASSIUM CHLORIDE CRYS ER 20 MEQ PO TBCR: 20.0000 meq | EXTENDED_RELEASE_TABLET | Freq: Every day | ORAL | Status: DC

## 2017-05-26 MED ORDER — FENTANYL CITRATE (PF) 100 MCG/2ML IJ SOLN
INTRAMUSCULAR | Status: DC | PRN
  Administered 2017-05-26: 50 ug via INTRAVENOUS

## 2017-05-26 MED ORDER — ASPIRIN 81 MG PO CHEW: 81.0000 mg | CHEWABLE_TABLET | ORAL | Status: DC

## 2017-05-26 MED ORDER — ATORVASTATIN CALCIUM 80 MG PO TABS: 80.0000 mg | ORAL_TABLET | Freq: Every day | ORAL | Status: DC

## 2017-05-26 SURGICAL SUPPLY — 10 items
CATH INFINITI 5FR ANG PIGTAIL (CATHETERS) ×3 IMPLANT
CATH INFINITI 5FR JL4 (CATHETERS) ×3 IMPLANT
CATH INFINITI JR4 5F (CATHETERS) ×3 IMPLANT
DEVICE CLOSURE MYNXGRIP 5F (Vascular Products) ×3 IMPLANT
KIT MANI 3VAL PERCEP (MISCELLANEOUS) ×3 IMPLANT
NEEDLE PERC 18GX7CM (NEEDLE) ×3 IMPLANT
NEEDLE SMART REG 18GX2-3/4 (NEEDLE) ×3 IMPLANT
PACK CARDIAC CATH (CUSTOM PROCEDURE TRAY) ×3 IMPLANT
SHEATH AVANTI 5FR X 11CM (SHEATH) ×3 IMPLANT
WIRE GUIDERIGHT .035X150 (WIRE) ×3 IMPLANT

## 2017-05-26 NOTE — Progress Notes (Signed)
CRITICAL VALUE ALERT  Critical Value: Troponin 3.8 Date & Time Notied:  05/25/17/1735 Provider Notified: Dr. Katheren Shams  Orders Received/Actions taken: none, patient is on heparin drip

## 2017-05-26 NOTE — Progress Notes (Signed)
[facility] - [place] at [facility]   PATIENT NAME: Jon Garner    MR#:  [id number]  DATE OF BIRTH:  1965-11-24  SUBJECTIVE:  CHIEF COMPLAINT:   Chief Complaint  Patient presents with  . Code STEMI   No chest pain today.  Has fatigue Heparin drip.  REVIEW OF SYSTEMS:    Review of Systems  Constitutional: Positive for malaise/fatigue. Negative for chills and fever.  HENT: Negative for sore throat.   Eyes: Negative for blurred vision, double vision and pain.  Respiratory: Positive for shortness of breath. Negative for cough, hemoptysis and wheezing.   Cardiovascular: Positive for chest pain. Negative for palpitations, orthopnea and leg swelling.  Gastrointestinal: Negative for abdominal pain, constipation, diarrhea, heartburn, nausea and vomiting.  Genitourinary: Negative for dysuria and hematuria.  Musculoskeletal: Negative for back pain and joint pain.  Skin: Negative for rash.  Neurological: Negative for sensory change, speech change, focal weakness and headaches.  Endo/Heme/Allergies: Does not bruise/bleed easily.  Psychiatric/Behavioral: Negative for depression. The patient is not nervous/anxious.     DRUG ALLERGIES:   Allergies  Allergen Reactions  . Penicillins Other (See Comments)    Has patient had a PCN reaction causing immediate rash, facial/tongue/throat swelling, SOB or lightheadedness with hypotension: Unknown Has patient had a PCN reaction causing severe rash involving mucus membranes or skin necrosis: Unknown Has patient had a PCN reaction that required hospitalization: Unknown Has patient had a PCN reaction occurring within the last 10 years: Unknown If all of the above answers are "NO", then may proceed with Cephalosporin use.     VITALS:  Blood pressure (!) 170/91, pulse 84, temperature 98 F (36.7 C), temperature source Oral, resp. rate 18, height 5\' 8"  (1.727 m), weight 133.8 kg (295 lb), SpO2 94 %.  PHYSICAL EXAMINATION:    Physical Exam  GENERAL:  52 y.o.-year-old patient lying in the bed with no acute distress.  EYES: Pupils equal, round, reactive to light and accommodation. No scleral icterus. Extraocular muscles intact.  HEENT: Head atraumatic, normocephalic. Oropharynx and nasopharynx clear.  NECK:  Supple, no jugular venous distention. No thyroid enlargement, no tenderness.  LUNGS: Normal breath sounds bilaterally, no wheezing, rales, rhonchi. No use of accessory muscles of respiration.  CARDIOVASCULAR: S1, S2 normal. No murmurs, rubs, or gallops.  ABDOMEN: Soft, nontender, nondistended. Bowel sounds present. No organomegaly or mass.  EXTREMITIES: No cyanosis, clubbing or edema b/l.    NEUROLOGIC: Cranial nerves II through XII are intact. No focal Motor or sensory deficits b/l.   PSYCHIATRIC: The patient is alert and oriented x 3.  SKIN: No obvious rash, lesion, or ulcer.   LABORATORY PANEL:   CBC Recent Labs  Lab 05/26/17 0522  WBC 10.3  HGB 14.1  HCT 40.7  PLT 270   ------------------------------------------------------------------------------------------------------------------ Chemistries  Recent Labs  Lab 05/25/17 1355 05/26/17 0522  NA 139 138  K 3.9 3.5  CL 104 106  CO2 26 24  GLUCOSE 145* 118*  BUN 12 12  CREATININE 1.10 0.99  CALCIUM 9.0 8.1*  AST 43*  --   ALT 43  --   ALKPHOS 117  --   BILITOT 0.9  --    ------------------------------------------------------------------------------------------------------------------  Cardiac Enzymes Recent Labs  Lab 05/26/17 0522  TROPONINI 8.21*   ------------------------------------------------------------------------------------------------------------------  RADIOLOGY:  Dg Chest Port 1 View  Result Date: 05/25/2017 CLINICAL DATA:  Sudden onset severe chest pain.  Ex-smoker. EXAM: PORTABLE CHEST 1 VIEW COMPARISON:  04/11/2009. FINDINGS: Interval mildly enlarged cardiac silhouette and mild  prominence of the pulmonary  vasculature and interstitial markings. No visible pleural fluid. No acute bony abnormality. IMPRESSION: Interval mild cardiomegaly and mild changes of congestive heart failure. Electronically Signed   By: Beckie Salts M.D.   On: 05/25/2017 14:43   Ct Angio Chest Aorta W And/or Wo Contrast  Result Date: 05/25/2017 CLINICAL DATA:  Sudden onset severe chest pain. Severe diaphoresis. Ex-smoker. EXAM: CT ANGIOGRAPHY CHEST WITH CONTRAST TECHNIQUE: Multidetector CT imaging of the chest was performed using the standard protocol during bolus administration of intravenous contrast. Multiplanar CT image reconstructions and MIPs were obtained to evaluate the vascular anatomy. CONTRAST:  ISOVUE-370 IOPAMIDOL (ISOVUE-370) INJECTION 76% COMPARISON:  Portable chest radiograph obtained today. FINDINGS: Cardiovascular: Satisfactory opacification of the pulmonary arteries to the segmental level. No evidence of pulmonary embolism. Normal heart size. No pericardial effusion. No aortic dissection or aneurysm. Multi-vessel coronary artery calcification. Mediastinum/Nodes: No enlarged mediastinal, hilar, or axillary lymph nodes. Thyroid gland, trachea, and esophagus demonstrate no significant findings. Lungs/Pleura: Clear lungs. No pleural fluid. No nodules. Minimally prominent interstitial markings with peripheral Kerley lines bilaterally. Upper Abdomen: Diffuse low density of the liver relative to the spleen. Musculoskeletal: Extensive thoracic and lower cervical spine degenerative changes. Review of the MIP images confirms the above findings. IMPRESSION: 1. No pulmonary emboli, aortic dissection or aortic aneurysm. 2. Multi-vessel atheromatous coronary artery calcifications. 3. Minimal interstitial pulmonary edema/chronic interstitial lung disease. 4. Diffuse hepatic steatosis. Electronically Signed   By: Beckie Salts M.D.   On: 05/25/2017 15:10     ASSESSMENT AND PLAN:   * NSTEMI Aspirin, heparin drip.  Beta-blocker.   Lipitor Cardiac catheterization later today.  Echocardiogram done and pending.  *Accelerated hypertension.  On beta-blocker and ACE inhibitor.  Improving.  *Sleep apnea.  Will need outpatient sleep study.  *Hyperlipidemia.  On statin.  All the records are reviewed and case discussed with Care Management/Social Workerr. Management plans discussed with the patient, family and they are in agreement.  CODE STATUS: Full code  DVT Prophylaxis: SCDs  TOTAL TIME TAKING CARE OF THIS PATIENT: 35 minutes.   POSSIBLE D/C IN 1-2 DAYS, DEPENDING ON CLINICAL CONDITION.  Orie Fisherman M.D on 05/26/2017 at 11:12 AM  Between 7am to 6pm - Pager - [phone]  After 6pm go to [email or web address] - password [email or web address]  [facility] [place] [facility]  Office  [phone]  CC: Primary care physician; Patient, No Pcp Per  Note: This dictation was prepared with Dragon dictation along with smaller phrase technology. Any transcriptional errors that result from this process are unintentional.

## 2017-05-26 NOTE — Discharge Summary (Signed)
[facility] - [place] at [facility]   PATIENT NAME: Jon Garner    MR#:  [id number]  DATE OF BIRTH:  1966/01/07  DATE OF ADMISSION:  05/25/2017 ADMITTING PHYSICIAN: Bertrum Sol, MD  DATE OF DISCHARGE: 05/26/2017  PRIMARY CARE PHYSICIAN: Patient, No Pcp Per   ADMISSION DIAGNOSIS:  Abnormal EKG [R94.31] Hypertensive emergency [I16.1] Chest pain, unspecified type [R07.9]  DISCHARGE DIAGNOSIS:  Active Problems:   Malignant hypertension   SECONDARY DIAGNOSIS:   Past Medical History:  Diagnosis Date  . Essential hypertension   . GERD (gastroesophageal reflux disease)   . Morbid obesity (HCC)   . PXE (pseudoxanthoma elasticum)   . Snores   . Stroke [facility])    a. Age 36     ADMITTING HISTORY  HISTORY OF PRESENT ILLNESS: Jon Garner  is a 52 y.o. 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  Old inferior MI, patient evaluated emergency room, continues to complain of chest pain-down to 1-2 out of 10 from 10 out of 10, patient is now been admitted for acute malignant hypertension, acute elevated troponins for which non-STEMI cannot be ruled out.    HOSPITAL COURSE:   * NSTEMI Admitted to tele floor and had heparin drip, ASA, BB, Statin. Tele monitoring. Significant increase in  troponin  Cardiac cath showed Severe three vessel disease,  Proximal, mid and distal LAD Proximal to mid LCX Prox and distal RCA Normal LV function  Transfer to [facility] for CABG.  * Malignant HTN Improved with meds  Stabilized and will be tansffered to [facility] cone   CONSULTS OBTAINED:  Treatment Team:  Duke Salvia, MD  DRUG ALLERGIES:   Allergies  Allergen Reactions  . Penicillins Other (See Comments)    Has patient had a PCN reaction causing immediate rash, facial/tongue/throat swelling, SOB or lightheadedness with hypotension: Unknown Has patient had a PCN reaction causing severe rash involving mucus membranes or skin necrosis: Unknown Has patient had a PCN reaction that required hospitalization: Unknown Has patient had a PCN reaction occurring within the last 10 years: Unknown If all of the above answers are "NO", then may proceed with Cephalosporin use.     DISCHARGE MEDICATIONS:   Allergies as of 05/26/2017      Reactions   Penicillins Other (See Comments)   Has patient had a PCN reaction causing immediate rash, facial/tongue/throat swelling, SOB or lightheadedness with hypotension: Unknown Has patient had a PCN reaction causing severe rash involving mucus membranes or skin necrosis: Unknown Has patient had a PCN reaction that required hospitalization: Unknown Has patient had a PCN reaction occurring within the last 10 years: Unknown If all of the above answers are "NO", then may proceed with Cephalosporin use.      Medication List    TAKE these medications   aspirin EC 81 MG tablet Take 81 mg by mouth once.   atorvastatin 80 MG tablet Commonly known as:  LIPITOR Take 1 tablet (80 mg total) by mouth  daily at 6 PM.   lisinopril 20 MG tablet Commonly known as:  PRINIVIL,ZESTRIL Take 1 tablet (20 mg total) by mouth daily. Start taking on:  05/27/2017   metoprolol tartrate 25 MG tablet Commonly known as:  LOPRESSOR Take 1 tablet (25 mg total) by  mouth 2 (two) times daily.   omeprazole 20 MG capsule Commonly known as:  PRILOSEC Take 20 mg by mouth daily as needed (acid reflux.).   potassium chloride SA 20 MEQ tablet Commonly known as:  K-DUR,KLOR-CON Take 1 tablet (20 mEq total) by mouth daily.   ranitidine 150 MG tablet Commonly known as:  ZANTAC Take 150 mg by mouth 2 (two) times daily.       Today   VITAL SIGNS:  Blood pressure (!) 160/81, pulse 70, temperature 98 F (36.7 C), temperature source Oral, resp. rate 19, height 5\' 8"  (1.727 m), weight 133.8 kg (295 lb), SpO2 93 %.  I/O:    Intake/Output Summary (Last 24 hours) at 05/26/2017 1527 Last data filed at 05/26/2017 0805 Gross per 24 hour  Intake 515.31 ml  Output 1700 ml  Net -1184.69 ml    PHYSICAL EXAMINATION:  Physical Exam  GENERAL:  52 y.o.-year-old patient lying in the bed with no acute distress.  LUNGS: Normal breath sounds bilaterally, no wheezing, rales,rhonchi or crepitation. No use of accessory muscles of respiration.  CARDIOVASCULAR: S1, S2 normal. No murmurs, rubs, or gallops.  ABDOMEN: Soft, non-tender, non-distended. Bowel sounds present. No organomegaly or mass.  NEUROLOGIC: Moves all 4 extremities. PSYCHIATRIC: The patient is alert and oriented x 3.  SKIN: No obvious rash, lesion, or ulcer.   DATA REVIEW:   CBC Recent Labs  Lab 05/26/17 0522  WBC 10.3  HGB 14.1  HCT 40.7  PLT 270    Chemistries  Recent Labs  Lab 05/25/17 1355 05/26/17 0522  NA 139 138  K 3.9 3.5  CL 104 106  CO2 26 24  GLUCOSE 145* 118*  BUN 12 12  CREATININE 1.10 0.99  CALCIUM 9.0 8.1*  AST 43*  --   ALT 43  --   ALKPHOS 117  --   BILITOT 0.9  --     Cardiac Enzymes Recent Labs  Lab 05/26/17 0522  TROPONINI 8.21*    Microbiology Results  No results found for this or any previous visit.  RADIOLOGY:  Dg Chest Port 1 View  Result Date: 05/25/2017 CLINICAL DATA:  Sudden onset severe chest pain.  Ex-smoker. EXAM: PORTABLE CHEST 1 VIEW  COMPARISON:  04/11/2009. FINDINGS: Interval mildly enlarged cardiac silhouette and mild prominence of the pulmonary vasculature and interstitial markings. No visible pleural fluid. No acute bony abnormality. IMPRESSION: Interval mild cardiomegaly and mild changes of congestive heart failure. Electronically Signed   By: Beckie Salts M.D.   On: 05/25/2017 14:43   Ct Angio Chest Aorta W And/or Wo Contrast  Result Date: 05/25/2017 CLINICAL DATA:  Sudden onset severe chest pain. Severe diaphoresis. Ex-smoker. EXAM: CT ANGIOGRAPHY CHEST WITH CONTRAST TECHNIQUE: Multidetector CT imaging of the chest was performed using the standard protocol during bolus administration of intravenous contrast. Multiplanar CT image reconstructions and MIPs were obtained to evaluate the vascular anatomy. CONTRAST:  ISOVUE-370 IOPAMIDOL (ISOVUE-370) INJECTION 76% COMPARISON:  Portable chest radiograph obtained today. FINDINGS: Cardiovascular: Satisfactory opacification of the pulmonary arteries to the segmental level. No evidence of pulmonary embolism. Normal heart size. No pericardial effusion. No aortic dissection or aneurysm. Multi-vessel coronary artery calcification. Mediastinum/Nodes: No enlarged mediastinal, hilar, or axillary lymph  nodes. Thyroid gland, trachea, and esophagus demonstrate no significant findings. Lungs/Pleura: Clear lungs. No pleural fluid. No nodules. Minimally prominent interstitial markings with peripheral Kerley lines bilaterally. Upper Abdomen: Diffuse low density of the liver relative to the spleen. Musculoskeletal: Extensive thoracic and lower cervical spine degenerative changes. Review of the MIP images confirms the above findings. IMPRESSION: 1. No pulmonary emboli, aortic dissection or aortic aneurysm. 2. Multi-vessel atheromatous coronary artery calcifications. 3. Minimal interstitial pulmonary edema/chronic interstitial lung disease. 4. Diffuse hepatic steatosis. Electronically Signed   By: Beckie Salts M.D.   On: 05/25/2017 15:10    Follow up with PCP in 1 week.  Management plans discussed with the patient, family and they are in agreement.  CODE STATUS:     Code Status Orders  (From admission, onward)        Start     Ordered   05/26/17 1339  Full code  Continuous     05/26/17 1338    Code Status History    Date Active Date Inactive Code Status Order ID Comments User Context   05/25/2017 1726 05/26/2017 1338 Full Code [id number]  Salary, Evelena Asa, MD Inpatient      TOTAL TIME TAKING CARE OF THIS PATIENT ON DAY OF DISCHARGE: more than 30 minutes.   Orie Fisherman M.D on 05/26/2017 at 3:27 PM  Between 7am to 6pm - Pager - [phone]  After 6pm go to [email or web address] - password [email or web address]  [facility] [place] [facility]  Office  [phone]  CC: Primary care physician; Patient, No Pcp Per  Note: This dictation was prepared with Dragon dictation along with smaller phrase technology. Any transcriptional errors that result from this process are unintentional.

## 2017-05-26 NOTE — Consult Note (Addendum)
Cardiology Consult    Patient ID: Jon Garner. MRN: [id number], DOB/AGE: 1965/02/08   Admit date: 05/25/2017 Date of Consult: 05/26/2017  Primary Physician: Patient, No Pcp Per Primary Cardiologist: Julien Nordmann, MD Requesting Provider: Kathie Rhodes. Sudini, MD  Patient Profile    Jon Garner. is a 53 y.o. male with a history of chest pain, DOE, knee pain, obesity, GERD, HTN, PXE, and remote stroke (felt to be related to PXE), who is being seen today for the evaluation of NSTEMI at the request of Dr. Elpidio Anis.  Past Medical History   Past Medical History:  Diagnosis Date  . Essential hypertension   . GERD (gastroesophageal reflux disease)   . Morbid obesity (HCC)   . PXE (pseudoxanthoma elasticum)   . Snores   . Stroke [facility])    a. Age 31    Past Surgical History:  Procedure Laterality Date  . KNEE SURGERY Bilateral      Allergies  Allergies  Allergen Reactions  . Penicillins Other (See Comments)    Has patient had a PCN reaction causing immediate rash, facial/tongue/throat swelling, SOB or lightheadedness with hypotension: Unknown Has patient had a PCN reaction causing severe rash involving mucus membranes or skin necrosis: Unknown Has patient had a PCN reaction that required hospitalization: Unknown Has patient had a PCN reaction occurring within the last 10 years: Unknown If all of the above answers are "NO", then may proceed with Cephalosporin use.     History of Present Illness    52 year old male with a prior history of hypertension, obesity, remote tobacco abuse, obesity, GERD, PXE, and remote stroke at age 11 which was felt to be secondary to PXE.  He lives locally and works as a Investment banker, operational.  He is not very active in the setting of bilateral knee pain and prior surgeries.  In late 2015, he was evaluated by cardiology secondary to exertional dyspnea, diaphoresis, and chest pain.  He underwent stress testing which was reportedly normal.  He was diagnosed with GERD  and placed on Prilosec.  He says that following that, he had intermittent episodes of exertional diaphoresis and dyspnea that  occurred almost exclusively while he was up and cooking in the kitchen and moving around.  He would typically the sit and take a Zantac with relief of symptoms in 10 to 15 minutes.  This would occur fairly frequently but he always talked up to GERD.  Over the past 3 to 4 weeks, symptoms have been occurring on a regular basis, up to multiple times per day, lasting 10 to 15 minutes, and resolving with rest and/or Zantac.  Spells are often preceded by a coughing fit  diaphoresis  dyspnea.  He does not usually have chest discomfort but yesterday, he was cooking macaroni and cheese for a birthday party and had sudden onset of severe substernal chest heaviness described as "an elephant on [his] chest."  He sat and took Zantac and aspirin without relief.  He then presented to the emergency department where ECG was notable for sinus tachycardia, inferolateral ST depression, and mild ST elevation in leads aVR and V1.  Initial troponin was mildly elevated at 0.04 but has since risen and peaked at 9.17.  He is 8.21 this morning.  CTA chest neg for PE. Multivessel coronary Ca2+ noted. He is currently chest pain-free.  He is n.p.o. and pending catheterization.  Inpatient Medications    . acetaminophen  1,000 mg Oral Once  . aspirin  324 mg Oral NOW  Or  . aspirin  300 mg Rectal NOW  . aspirin EC  81 mg Oral Daily  . furosemide  20 mg Intravenous Q12H  . lisinopril  20 mg Oral Daily  . metoprolol tartrate  25 mg Oral BID  . pantoprazole  40 mg Oral Daily  . potassium chloride  20 mEq Oral Daily  . simvastatin  20 mg Oral q1800  . sodium chloride flush  3 mL Intravenous Q12H    Family History    Family History  Problem Relation Age of Onset  . Dementia Mother   . Other Father        died in his 20's  . COPD Father   . Multiple sclerosis Sister    indicated that his mother is  alive. He indicated that his father is deceased. He indicated that his sister is alive. He indicated that his brother is alive.   Social History    Social History   Socioeconomic History  . Marital status: Single    Spouse name: Not on file  . Number of children: Not on file  . Years of education: Not on file  . Highest education level: Not on file  Occupational History  . Occupation: Chef  Social Needs  . Financial resource strain: Not on file  . Food insecurity:    Worry: Not on file    Inability: Not on file  . Transportation needs:    Medical: Not on file    Non-medical: Not on file  Tobacco Use  . Smoking status: Former Smoker    Packs/day: 1.50    Years: 10.00    Pack years: 15.00    Types: Cigarettes    Last attempt to quit: 12/05/2013    Years since quitting: 3.4  . Smokeless tobacco: Never Used  Substance and Sexual Activity  . Alcohol use: Yes    Comment: a few drinks/year  . Drug use: Never  . Sexual activity: Not on file  Lifestyle  . Physical activity:    Days per week: Not on file    Minutes per session: Not on file  . Stress: Not on file  Relationships  . Social connections:    Talks on phone: Not on file    Gets together: Not on file    Attends religious service: Not on file    Active member of club or organization: Not on file    Attends meetings of clubs or organizations: Not on file    Relationship status: Not on file  . Intimate partner violence:    Fear of current or ex partner: Not on file    Emotionally abused: Not on file    Physically abused: Not on file    Forced sexual activity: Not on file  Other Topics Concern  . 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  Does not routinely exercise.     Review of Systems    General:  No chills, fever, night sweats or weight changes.  Cardiovascular:  +++ exertional chest pain, +++ dyspnea on exertion, no edema, orthopnea, palpitations, paroxysmal nocturnal  dyspnea. Dermatological: No rash, lesions/masses Respiratory: +++ cough, +++ dyspnea Urologic: No hematuria, dysuria Abdominal:   No nausea, vomiting, diarrhea, bright red blood per rectum, melena, or hematemesis Neurologic:  No visual changes, wkns, changes in mental status. All other systems reviewed and are otherwise negative except as noted above.  Physical Exam    Blood pressure 130/67, pulse 75, temperature  97.9 F (36.6 C), resp. rate 18, height 5\' 10"  (1.778 m), weight 295 lb 9.6 oz (134.1 kg), SpO2 100 %.  General: Pleasant, NAD Psych: Normal affect. Neuro: Alert and oriented X 3. Moves all extremities spontaneously. HEENT: Normal  Neck: Supple, obese, without bruits or JVD. Lungs:  Resp regular and unlabored, CTA. Heart: RRR no s3, s4, or murmurs. Abdomen: Soft, non-tender, non-distended, BS + x 4.  Extremities: No clubbing, cyanosis or edema. DP/PT/Radials 2+ and equal bilaterally.  Labs   Recent Labs    05/25/17 1355 05/25/17 1735 05/25/17 2207 05/26/17 0522  TROPONINI 0.04* 3.80* 9.17* 8.21*   Lab Results  Component Value Date   WBC 10.3 05/26/2017   HGB 14.1 05/26/2017   HCT 40.7 05/26/2017   MCV 87.8 05/26/2017   PLT 270 05/26/2017    Recent Labs  Lab 05/25/17 1355 05/26/17 0522  NA 139 138  K 3.9 3.5  CL 104 106  CO2 26 24  BUN 12 12  CREATININE 1.10 0.99  CALCIUM 9.0 8.1*  PROT 8.2*  --   BILITOT 0.9  --   ALKPHOS 117  --   ALT 43  --   AST 43*  --   GLUCOSE 145* 118*   Lab Results  Component Value Date   CHOL 215 (H) 05/25/2017   HDL 38 (L) 05/25/2017   LDLCALC 146 (H) 05/25/2017   TRIG 155 (H) 05/25/2017    Radiology Studies    Dg Chest Port 1 View  Result Date: 05/25/2017 CLINICAL DATA:  Sudden onset severe chest pain.  Ex-smoker. EXAM: PORTABLE CHEST 1 VIEW COMPARISON:  04/11/2009. FINDINGS: Interval mildly enlarged cardiac silhouette and mild prominence of the pulmonary vasculature and interstitial markings. No visible pleural  fluid. No acute bony abnormality. IMPRESSION: Interval mild cardiomegaly and mild changes of congestive heart failure. Electronically Signed   By: Beckie Salts M.D.   On: 05/25/2017 14:43   Ct Angio Chest Aorta W And/or Wo Contrast  Result Date: 05/25/2017 CLINICAL DATA:  Sudden onset severe chest pain. Severe diaphoresis. Ex-smoker. EXAM: CT ANGIOGRAPHY CHEST WITH CONTRAST TECHNIQUE: Multidetector CT imaging of the chest was performed using the standard protocol during bolus administration of intravenous contrast. Multiplanar CT image reconstructions and MIPs were obtained to evaluate the vascular anatomy. CONTRAST:  ISOVUE-370 IOPAMIDOL (ISOVUE-370) INJECTION 76% COMPARISON:  Portable chest radiograph obtained today. FINDINGS: Cardiovascular: Satisfactory opacification of the pulmonary arteries to the segmental level. No evidence of pulmonary embolism. Normal heart size. No pericardial effusion. No aortic dissection or aneurysm. Multi-vessel coronary artery calcification. Mediastinum/Nodes: No enlarged mediastinal, hilar, or axillary lymph nodes. Thyroid gland, trachea, and esophagus demonstrate no significant findings. Lungs/Pleura: Clear lungs. No pleural fluid. No nodules. Minimally prominent interstitial markings with peripheral Kerley lines bilaterally. Upper Abdomen: Diffuse low density of the liver relative to the spleen. Musculoskeletal: Extensive thoracic and lower cervical spine degenerative changes. Review of the MIP images confirms the above findings. IMPRESSION: 1. No pulmonary emboli, aortic dissection or aortic aneurysm. 2. Multi-vessel atheromatous coronary artery calcifications. 3. Minimal interstitial pulmonary edema/chronic interstitial lung disease. 4. Diffuse hepatic steatosis. Electronically Signed   By: Beckie Salts M.D.   On: 05/25/2017 15:10    ECG & Cardiac Imaging    ST, 114, up to 2mm inflat ST dep w/ ST elev in aVR/aVL.  Assessment & Plan    1.  Non-ST segment  elevation myocardial infarction: With prior history of exertional dyspnea and diaphoresis dating back to at least 2015 with apparent  negative stress test at that time.  He presented to the emergency department on April 21 with a 3 to 4-week history of more progressive symptoms followed by severe substernal chest heaviness on the day of admission.  Initial troponin was mildly elevated and he is subsequently ruled in, peaking his troponin at 9.17.  He is currently chest pain-free and troponin is trending down.  Continue aspirin, heparin, beta-blocker, ACE inhibitor, and statin-though I will increase potency of statin to Lipitor 80.  He will undergo diagnostic catheterization today. The patient understands that risks include but are not limited to stroke (1 in 1000), death (1 in 1000), kidney failure [usually temporary] (1 in 500), bleeding (1 in 200), allergic reaction [possibly serious] (1 in 200), and agrees to proceed.  Eventual cardiac rehab.  2.  Untreated hypertension: Continue beta-blocker and ACE inhibitor.  Currently stable.  3.  Hyperlipidemia: LDL 146.  Will change to Lipitor 80 mg.  4.  GERD: Continue PPI.  5.  Morbid obesity: Eventual cardiac rehab.  Activity limited secondary to chronic knee pain.  6.  Snoring: He will require outpatient sleep study.  7.  PXE: He is on chronic aspirin and vitamin D therapy at home.  Increased risk for CV dzs due to Ca2+.  See above.  8.  Hypokalemia:  Supplementation ordered.  Signed, Nicolasa Ducking, NP 05/26/2017, 9:47 AM  For questions or updates, please contact   Please consult [email or web address] for contact info under Cardiology/STEMI.

## 2017-05-26 NOTE — Progress Notes (Signed)
Cardiac cath report  Severe three vessel disease,  Proximal, mid and distal LAD Proximal to mid LCX Prox and distal RCA Normal LV function  Recommendations:  Given diffuse  Severe three vessel disease,  would recommend transfer to [facility] for consideration of CABG Restart heparin IV infusion this evening  Signed, Dossie Arbour, MD, Ph.D [facility]

## 2017-05-26 NOTE — Progress Notes (Addendum)
ANTICOAGULATION CONSULT NOTE - Initial Consult  Pharmacy Consult for heparin gtt Indication: chest pain/ACS  Allergies  Allergen Reactions  . Penicillins Other (See Comments)    Has patient had a PCN reaction causing immediate rash, facial/tongue/throat swelling, SOB or lightheadedness with hypotension: Unknown Has patient had a PCN reaction causing severe rash involving mucus membranes or skin necrosis: Unknown Has patient had a PCN reaction that required hospitalization: Unknown Has patient had a PCN reaction occurring within the last 10 years: Unknown If all of the above answers are "NO", then may proceed with Cephalosporin use.     Patient Measurements: Height: 5\' 8"  (172.7 cm) Weight: 295 lb (133.8 kg) IBW/kg (Calculated) : 68.4 Heparin Dosing Weight: 104kg  Vital Signs: Temp: 98 F (36.7 C) (04/22 1039) Temp Source: Oral (04/22 1039) BP: 156/91 (04/22 1316) Pulse Rate: 73 (04/22 1316)  Labs: Recent Labs    05/25/17 1355 05/25/17 1507 05/25/17 1735 05/25/17 2207 05/26/17 0522  HGB  --  15.1  --   --  14.1  HCT  --  44.3  --   --  40.7  PLT  --  281  --   --  270  APTT  --  95*  --  76*  --   LABPROT  --  13.1  --   --   --   INR  --  1.00  --   --   --   HEPARINUNFRC  --   --   --  0.43 0.22*  CREATININE 1.10  --   --   --  0.99  TROPONINI 0.04*  --  3.80* 9.17* 8.21*    Estimated Creatinine Clearance: 116.8 mL/min (by C-G formula based on SCr of 0.99 mg/dL).   Medical History: Past Medical History:  Diagnosis Date  . Essential hypertension   . GERD (gastroesophageal reflux disease)   . Morbid obesity (HCC)   . PXE (pseudoxanthoma elasticum)   . Snores   . Stroke [facility])    a. Age 52    Medications:  Medications Prior to Admission  Medication Sig Dispense Refill Last Dose  . aspirin EC 81 MG tablet Take 81 mg by mouth once.   05/25/2017 at 1300  . omeprazole (PRILOSEC) 20 MG capsule Take 20 mg by mouth daily as needed (acid reflux.).   PRN at PRN   . ranitidine (ZANTAC) 150 MG tablet Take 150 mg by mouth 2 (two) times daily.   05/25/2017 at 0800   Scheduled:  . [MAR Hold] acetaminophen  1,000 mg Oral Once  . [MAR Hold] aspirin  324 mg Oral NOW   Or  . [MAR Hold] aspirin  300 mg Rectal NOW  . [START ON 05/27/2017] aspirin  81 mg Oral Pre-Cath  . [MAR Hold] aspirin EC  81 mg Oral Daily  . [MAR Hold] atorvastatin  80 mg Oral q1800  . [MAR Hold] lisinopril  20 mg Oral Daily  . [MAR Hold] metoprolol tartrate  25 mg Oral BID  . [MAR Hold] pantoprazole  40 mg Oral Daily  . [MAR Hold] potassium chloride  20 mEq Oral Daily  . [MAR Hold] sodium chloride flush  3 mL Intravenous Q12H  . sodium chloride flush  3 mL Intravenous Q12H   Infusions:  . sodium chloride Stopped (05/25/17 1900)  . sodium chloride    . [START ON 05/27/2017] sodium chloride Stopped (05/26/17 1117)   Followed by  . [START ON 05/27/2017] sodium chloride 1 mL/kg/hr (05/26/17 1117)  . heparin  PRN: sodium chloride, [MAR Hold] acetaminophen, [MAR Hold] ALPRAZolam, [MAR Hold] fentaNYL (SUBLIMAZE) injection, [MAR Hold] hydrALAZINE, [MAR Hold] nitroGLYCERIN, [MAR Hold] ondansetron (ZOFRAN) IV, sodium chloride flush Anti-infectives (From admission, onward)   None      Assessment: 52 year old male with ACS requiring heparin gtt. Patient does not take any anticoagulants. Heparin drip was stopped for cath placement and being re-started at 2000 without bolus per Dr Mariah Milling.  Goal of Therapy:  Heparin level 0.3-0.7 units/ml Monitor platelets by anticoagulation protocol: Yes   Plan:  Start heparin infusion at 1600 units/hr Check anti-Xa level in 6 hours from re-start and daily while on heparin Continue to monitor H&H and platelets Patient recommended to be transferred to [facility] for CABG consideration  Continue previous regimen. Recheck heparin level at 0200 4/23 and CBC with tomorrow AM labs.  Lowella Bandy 05/26/2017,1:31 PM

## 2017-05-26 NOTE — H&P (Signed)
H&P Addendum, precardiac catheterization  52 year old gentleman with several risk factors for coronary disease including long-standing smoking, morbid obesity, hyperlipidemia, presenting with severe chest pain, sweating, troponin up to peak of 9 Currently pain-free this morning, started on heparin infusion last night  Echocardiogram reviewed showing grossly normal LV function, unable to identify wall motion abnormality on prelim review.  Final report pending  Discussed various treatment options with him, Given severity of symptoms, elevated troponin consistent with non-STEMI, we have recommended cardiac catheterization Normal renal function  I have reviewed the risks, indications, and alternatives to cardiac catheterization, possible angioplasty, and stenting with the patient. Risks include but are not limited to bleeding, infection, vascular injury, stroke, myocardial infection, arrhythmia, kidney injury, radiation-related injury in the case of prolonged fluoroscopy use, emergency cardiac surgery, and death. The patient understands the risks of serious complication is 1-2 in 1000 with diagnostic cardiac cath and 1-2% or less with angioplasty/stenting.   Patient was seen and evaluated prior to Cardiac catheterization procedure Symptoms, prior testing details again confirmed with the patient Patient examined, no significant change from prior exam Lab work reviewed in detail personally by myself Patient understands risk and benefit of the procedure, willing to proceed     Signed, Dossie Arbour, MD, Ph.D [facility]

## 2017-05-26 NOTE — Progress Notes (Signed)
Telemetry just notified this RN of 9 beats of SVT, pt was asymptomatic. MD notified, no new orders given. Will continue to monitor.   Shirley Friar, RN, BSN

## 2017-05-26 NOTE — Progress Notes (Signed)
*  PRELIMINARY RESULTS* Echocardiogram 2D Echocardiogram has been performed.  Jon Garner Jon Garner 05/26/2017, 9:07 AM

## 2017-05-26 NOTE — Plan of Care (Signed)
Pt's BP coming down nicely,  Vitals:   05/26/17 0005 05/26/17 0425  BP: (!) 151/68 (!) 144/84  Pulse: 70 73  Resp:  18  Temp:  98.5 F (36.9 C)  SpO2: 96% 97%   Heparin gtt infusing @ 50ml/hr. Receiving IV lasix. With good urine output. Independent in room, tolerating well.

## 2017-05-26 NOTE — Progress Notes (Signed)
ANTICOAGULATION CONSULT NOTE - Initial Consult  Pharmacy Consult for heparin gtt Indication: chest pain/ACS  Allergies  Allergen Reactions  . Penicillins Other (See Comments)    Has patient had a PCN reaction causing immediate rash, facial/tongue/throat swelling, SOB or lightheadedness with hypotension: Unknown Has patient had a PCN reaction causing severe rash involving mucus membranes or skin necrosis: Unknown Has patient had a PCN reaction that required hospitalization: Unknown Has patient had a PCN reaction occurring within the last 10 years: Unknown If all of the above answers are "NO", then may proceed with Cephalosporin use.     Patient Measurements: Height: 5\' 10"  (177.8 cm) Weight: 299 lb 11.2 oz (135.9 kg) IBW/kg (Calculated) : 73 Heparin Dosing Weight: 104kg  Vital Signs: Temp: 98.5 F (36.9 C) (04/22 0425) Temp Source: Oral (04/22 0425) BP: 144/84 (04/22 0425) Pulse Rate: 73 (04/22 0425)  Labs: Recent Labs    05/25/17 1355 05/25/17 1507 05/25/17 1735 05/25/17 2207 05/26/17 0522  HGB  --  15.1  --   --  14.1  HCT  --  44.3  --   --  40.7  PLT  --  281  --   --  270  APTT  --  95*  --  76*  --   LABPROT  --  13.1  --   --   --   INR  --  1.00  --   --   --   HEPARINUNFRC  --   --   --  0.43 0.22*  CREATININE 1.10  --   --   --  0.99  TROPONINI 0.04*  --  3.80* 9.17*  --     Estimated Creatinine Clearance: 121.2 mL/min (by C-G formula based on SCr of 0.99 mg/dL).   Medical History: Past Medical History:  Diagnosis Date  . PXE (pseudoxanthoma elasticum)   . Stroke [facility])     Medications:  Medications Prior to Admission  Medication Sig Dispense Refill Last Dose  . aspirin EC 81 MG tablet Take 81 mg by mouth once.   05/25/2017 at 1300  . omeprazole (PRILOSEC) 20 MG capsule Take 20 mg by mouth daily as needed (acid reflux.).   PRN at PRN  . ranitidine (ZANTAC) 150 MG tablet Take 150 mg by mouth 2 (two) times daily.   05/25/2017 at 0800   Scheduled:   . acetaminophen  1,000 mg Oral Once  . aspirin  324 mg Oral NOW   Or  . aspirin  300 mg Rectal NOW  . aspirin EC  81 mg Oral Daily  . furosemide  20 mg Intravenous Q12H  . heparin  1,500 Units Intravenous Once  . lisinopril  20 mg Oral BID  . metoprolol tartrate  50 mg Oral BID  . nitroGLYCERIN  1 inch Topical Q6H  . pantoprazole  40 mg Oral Daily  . potassium chloride  20 mEq Oral Daily  . simvastatin  20 mg Oral q1800  . sodium chloride flush  3 mL Intravenous Q12H   Infusions:  . sodium chloride Stopped (05/25/17 1900)  . heparin 1,400 Units/hr (05/25/17 1736)   PRN: acetaminophen, ALPRAZolam, fentaNYL (SUBLIMAZE) injection, hydrALAZINE, nitroGLYCERIN, ondansetron (ZOFRAN) IV Anti-infectives (From admission, onward)   None      Assessment: 52 year old male with ACS requiring heparin gtt. Patient does not take any anticoagulants PTA however elevated apTT which was likely due to heparin bolus given at 1428. However will get aptt with evening HL to verify.   Goal  of Therapy:  Heparin level 0.3-0.7 units/ml Monitor platelets by anticoagulation protocol: Yes   Plan:  Give 4000 units bolus x 1 Start heparin infusion at 1400 units/hr Check anti-Xa level in 6 hours and daily while on heparin Continue to monitor H&H and platelets   4/21 PM heparin level 0.43, aPTT 76. Continue current regimen. Recheck heparin level and CBC with tomorrow AM labs.  4/22 AM heparin level 0.22. 1500 unit bolus and increase rate to 1600 units/hr. Recheck in 6 hours.  Jon Garner S 05/26/2017,6:06 AM

## 2017-05-26 NOTE — H&P (Signed)
Cardiology Consult    Patient ID: Jon Garner. MRN: [id number], DOB/AGE: 1965-09-27   Admit date: 05/25/2017 Date of Consult: 05/26/2017  Primary Physician: Patient, No Pcp Per Primary Cardiologist: Julien Nordmann, MD Requesting Provider: Kathie Rhodes. Sudini, MD  Patient Profile    Jon Garbo. is a 52 y.o. male with a history of chest pain, DOE, knee pain, obesity, GERD, HTN, PXE, and remote stroke (felt to be related to PXE), who is being seen today for the evaluation of NSTEMI at the request of Dr. Elpidio Anis.  Past Medical History       Past Medical History:  Diagnosis Date  . Essential hypertension   . GERD (gastroesophageal reflux disease)   . Morbid obesity (HCC)   . PXE (pseudoxanthoma elasticum)   . Snores   . Stroke [facility])    a. Age 24         Past Surgical History:  Procedure Laterality Date  . KNEE SURGERY Bilateral      Allergies       Allergies  Allergen Reactions  . Penicillins Other (See Comments)    Has patient had a PCN reaction causing immediate rash, facial/tongue/throat swelling, SOB or lightheadedness with hypotension: Unknown Has patient had a PCN reaction causing severe rash involving mucus membranes or skin necrosis: Unknown Has patient had a PCN reaction that required hospitalization: Unknown Has patient had a PCN reaction occurring within the last 10 years: Unknown If all of the above answers are "NO", then may proceed with Cephalosporin use.     History of Present Illness    52 year old male with a prior history of hypertension, obesity, remote tobacco abuse, obesity, GERD, PXE, and remote stroke at age 40 which was felt to be secondary to PXE.  He lives locally and works as a Investment banker, operational.  He is not very active in the setting of bilateral knee pain and prior surgeries.  In late 2015, he was evaluated by cardiology secondary to exertional dyspnea, diaphoresis, and chest pain.  He underwent stress testing which was reportedly  normal.  He was diagnosed with GERD and placed on Prilosec.  He says that following that, he had intermittent episodes of exertional diaphoresis and dyspnea that  occurred almost exclusively while he was up and cooking in the kitchen and moving around.  He would typically the sit and take a Zantac with relief of symptoms in 10 to 15 minutes.  This would occur fairly frequently but he always talked up to GERD.  Over the past 3 to 4 weeks, symptoms have been occurring on a regular basis, up to multiple times per day, lasting 10 to 15 minutes, and resolving with rest and/or Zantac.  Spells are often preceded by a coughing fit  diaphoresis  dyspnea.  He does not usually have chest discomfort but yesterday, he was cooking macaroni and cheese for a birthday party and had sudden onset of severe substernal chest heaviness described as "an elephant on [his] chest."  He sat and took Zantac and aspirin without relief.  He then presented to the emergency department where ECG was notable for sinus tachycardia, inferolateral ST depression, and mild ST elevation in leads aVR and V1.  Initial troponin was mildly elevated at 0.04 but has since risen and peaked at 9.17.  He is 8.21 this morning.  CTA chest neg for PE. Multivessel coronary Ca2+ noted. Cath showed 3VD.  For tx to [facility] this evening.  Inpatient Medications    . acetaminophen  1,000 mg Oral Once  . aspirin  324 mg Oral NOW   Or  . aspirin  300 mg Rectal NOW  . aspirin EC  81 mg Oral Daily  . furosemide  20 mg Intravenous Q12H  . lisinopril  20 mg Oral Daily  . metoprolol tartrate  25 mg Oral BID  . pantoprazole  40 mg Oral Daily  . potassium chloride  20 mEq Oral Daily  . simvastatin  20 mg Oral q1800  . sodium chloride flush  3 mL Intravenous Q12H    Family History         Family History  Problem Relation Age of Onset  . Dementia Mother   . Other Father        died in his 69's  . COPD Father   . Multiple sclerosis Sister     indicated that his mother is alive. He indicated that his father is deceased. He indicated that his sister is alive. He indicated that his brother is alive.   Social History    Social History        Socioeconomic History  . Marital status: Single    Spouse name: Not on file  . Number of children: Not on file  . Years of education: Not on file  . Highest education level: Not on file  Occupational History  . Occupation: Chef  Social Needs  . Financial resource strain: Not on file  . Food insecurity:    Worry: Not on file    Inability: Not on file  . Transportation needs:    Medical: Not on file    Non-medical: Not on file  Tobacco Use  . Smoking status: Former Smoker    Packs/day: 1.50    Years: 10.00    Pack years: 15.00    Types: Cigarettes    Last attempt to quit: 12/05/2013    Years since quitting: 3.4  . Smokeless tobacco: Never Used  Substance and Sexual Activity  . Alcohol use: Yes    Comment: a few drinks/year  . Drug use: Never  . Sexual activity: Not on file  Lifestyle  . Physical activity:    Days per week: Not on file    Minutes per session: Not on file  . Stress: Not on file  Relationships  . Social connections:    Talks on phone: Not on file    Gets together: Not on file    Attends religious service: Not on file    Active member of club or organization: Not on file    Attends meetings of clubs or organizations: Not on file    Relationship status: Not on file  . Intimate partner violence:    Fear of current or ex partner: Not on file    Emotionally abused: Not on file    Physically abused: Not on file    Forced sexual activity: Not on file  Other Topics Concern  . 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  Does not routinely exercise.     Review of Systems    General:  No chills, fever, night sweats or weight changes.  Cardiovascular:  +++ exertional chest pain, +++  dyspnea on exertion, no edema, orthopnea, palpitations, paroxysmal nocturnal dyspnea. Dermatological: No rash, lesions/masses Respiratory: +++ cough, +++ dyspnea Urologic: No hematuria, dysuria Abdominal:   No nausea, vomiting, diarrhea, bright red blood per rectum, melena, or hematemesis Neurologic:  No visual changes, wkns, changes in mental status.  All other systems reviewed and are otherwise negative except as noted above.  Physical Exam    Blood pressure 130/67, pulse 75, temperature 97.9 F (36.6 C), resp. rate 18, height 5\' 10"  (1.778 m), weight 295 lb 9.6 oz (134.1 kg), SpO2 100 %.  General: Pleasant, NAD Psych: Normal affect. Neuro: Alert and oriented X 3. Moves all extremities spontaneously. HEENT: Normal   Neck: Supple, obese, without bruits or JVD. Lungs:  Resp regular and unlabored, CTA. Heart: RRR no s3, s4, or murmurs. Abdomen: Soft, non-tender, non-distended, BS + x 4.  Extremities: No clubbing, cyanosis or edema. DP/PT/Radials 2+ and equal bilaterally.  Labs         Recent Labs    05/25/17 1355 05/25/17 1735 05/25/17 2207 05/26/17 0522  TROPONINI 0.04* 3.80* 9.17* 8.21*        Lab Results  Component Value Date   WBC 10.3 05/26/2017   HGB 14.1 05/26/2017   HCT 40.7 05/26/2017   MCV 87.8 05/26/2017   PLT 270 05/26/2017        Recent Labs  Lab 05/25/17 1355 05/26/17 0522  NA 139 138  K 3.9 3.5  CL 104 106  CO2 26 24  BUN 12 12  CREATININE 1.10 0.99  CALCIUM 9.0 8.1*  PROT 8.2*  --   BILITOT 0.9  --   ALKPHOS 117  --   ALT 43  --   AST 43*  --   GLUCOSE 145* 118*        Lab Results  Component Value Date   CHOL 215 (H) 05/25/2017   HDL 38 (L) 05/25/2017   LDLCALC 146 (H) 05/25/2017   TRIG 155 (H) 05/25/2017    Radiology Studies    Dg Chest Port 1 View  Result Date: 05/25/2017 CLINICAL DATA:  Sudden onset severe chest pain.  Ex-smoker. EXAM: PORTABLE CHEST 1 VIEW COMPARISON:  04/11/2009. FINDINGS: Interval mildly  enlarged cardiac silhouette and mild prominence of the pulmonary vasculature and interstitial markings. No visible pleural fluid. No acute bony abnormality. IMPRESSION: Interval mild cardiomegaly and mild changes of congestive heart failure. Electronically Signed   By: Beckie Salts M.D.   On: 05/25/2017 14:43   Ct Angio Chest Aorta W And/or Wo Contrast  Result Date: 05/25/2017 CLINICAL DATA:  Sudden onset severe chest pain. Severe diaphoresis. Ex-smoker. EXAM: CT ANGIOGRAPHY CHEST WITH CONTRAST TECHNIQUE: Multidetector CT imaging of the chest was performed using the standard protocol during bolus administration of intravenous contrast. Multiplanar CT image reconstructions and MIPs were obtained to evaluate the vascular anatomy. CONTRAST:  ISOVUE-370 IOPAMIDOL (ISOVUE-370) INJECTION 76% COMPARISON:  Portable chest radiograph obtained today. FINDINGS: Cardiovascular: Satisfactory opacification of the pulmonary arteries to the segmental level. No evidence of pulmonary embolism. Normal heart size. No pericardial effusion. No aortic dissection or aneurysm. Multi-vessel coronary artery calcification. Mediastinum/Nodes: No enlarged mediastinal, hilar, or axillary lymph nodes. Thyroid gland, trachea, and esophagus demonstrate no significant findings. Lungs/Pleura: Clear lungs. No pleural fluid. No nodules. Minimally prominent interstitial markings with peripheral Kerley lines bilaterally. Upper Abdomen: Diffuse low density of the liver relative to the spleen. Musculoskeletal: Extensive thoracic and lower cervical spine degenerative changes. Review of the MIP images confirms the above findings. IMPRESSION: 1. No pulmonary emboli, aortic dissection or aortic aneurysm. 2. Multi-vessel atheromatous coronary artery calcifications. 3. Minimal interstitial pulmonary edema/chronic interstitial lung disease. 4. Diffuse hepatic steatosis. Electronically Signed   By: Beckie Salts M.D.   On: 05/25/2017 15:10    ECG &  Cardiac Imaging  ST, 114, up to 2mm inflat ST dep w/ ST elev in aVR/aVL.  Assessment & Plan    1.  Non-ST segment elevation myocardial infarction: With prior history of exertional dyspnea and diaphoresis dating back to at least 2015 with apparent negative stress test at that time.  He presented to the emergency department on April 21 with a 3 to 4-week history of more progressive symptoms followed by severe substernal chest heaviness on the day of admission.  Initial troponin was mildly elevated and he he peaked at 9.17. Cath earlier today has shown severe multivessel dzs.  In that setting he will be tx to [facility] for thoracic surgery eval. He is currently chest pain-free and troponin is trending down.  Continue aspirin, heparin, beta-blocker, ACE inhibitor, and statin-though I will increase potency of statin to Lipitor 80.   2.  Untreated hypertension: Continue beta-blocker and ACE inhibitor.  Currently stable.  3.  Hyperlipidemia: LDL 146.  Will change to Lipitor 80 mg.  4.  GERD: Continue PPI.  5.  Morbid obesity: Eventual cardiac rehab.  Activity limited secondary to chronic knee pain.  6.  Snoring: He will require outpatient sleep study.  7.  PXE: He is on chronic aspirin and vitamin D therapy at home.     Signed, Nicolasa Ducking, NP 05/26/2017, 5:29PM  For questions or updates, please contact   Please consult [email or web address] contactinfo under Cardiology/STEMI.

## 2017-05-27 ENCOUNTER — Encounter (HOSPITAL_COMMUNITY): Payer: Self-pay | Admitting: *Deleted

## 2017-05-27 ENCOUNTER — Inpatient Hospital Stay (HOSPITAL_COMMUNITY)
Admission: AD | Admit: 2017-05-27 | Discharge: 2017-06-04 | DRG: 236 | Disposition: A | Discharge status: Home Health | Payer: Self-pay | Source: Other Acute Inpatient Hospital | Attending: Thoracic Surgery (Cardiothoracic Vascular Surgery) | Admitting: Thoracic Surgery (Cardiothoracic Vascular Surgery)

## 2017-05-27 ENCOUNTER — Other Ambulatory Visit: Payer: Self-pay | Admitting: *Deleted

## 2017-05-27 DIAGNOSIS — R001 Bradycardia, unspecified: Secondary | ICD-10-CM | POA: Diagnosis not present

## 2017-05-27 DIAGNOSIS — I213 ST elevation (STEMI) myocardial infarction of unspecified site: Principal | ICD-10-CM | POA: Diagnosis present

## 2017-05-27 DIAGNOSIS — Z880 Allergy status to penicillin: Secondary | ICD-10-CM

## 2017-05-27 DIAGNOSIS — I2510 Atherosclerotic heart disease of native coronary artery without angina pectoris: Secondary | ICD-10-CM

## 2017-05-27 DIAGNOSIS — I25110 Atherosclerotic heart disease of native coronary artery with unstable angina pectoris: Secondary | ICD-10-CM | POA: Diagnosis present

## 2017-05-27 DIAGNOSIS — E119 Type 2 diabetes mellitus without complications: Secondary | ICD-10-CM | POA: Diagnosis present

## 2017-05-27 DIAGNOSIS — I340 Nonrheumatic mitral (valve) insufficiency: Secondary | ICD-10-CM | POA: Diagnosis present

## 2017-05-27 DIAGNOSIS — E876 Hypokalemia: Secondary | ICD-10-CM | POA: Diagnosis not present

## 2017-05-27 DIAGNOSIS — R0683 Snoring: Secondary | ICD-10-CM | POA: Diagnosis present

## 2017-05-27 DIAGNOSIS — I10 Essential (primary) hypertension: Secondary | ICD-10-CM | POA: Diagnosis present

## 2017-05-27 DIAGNOSIS — Z09 Encounter for follow-up examination after completed treatment for conditions other than malignant neoplasm: Secondary | ICD-10-CM

## 2017-05-27 DIAGNOSIS — J9811 Atelectasis: Secondary | ICD-10-CM | POA: Diagnosis not present

## 2017-05-27 DIAGNOSIS — I459 Conduction disorder, unspecified: Secondary | ICD-10-CM | POA: Diagnosis not present

## 2017-05-27 DIAGNOSIS — Z9689 Presence of other specified functional implants: Secondary | ICD-10-CM

## 2017-05-27 DIAGNOSIS — I471 Supraventricular tachycardia: Secondary | ICD-10-CM | POA: Diagnosis not present

## 2017-05-27 DIAGNOSIS — M25569 Pain in unspecified knee: Secondary | ICD-10-CM | POA: Diagnosis present

## 2017-05-27 DIAGNOSIS — I4892 Unspecified atrial flutter: Secondary | ICD-10-CM | POA: Diagnosis not present

## 2017-05-27 DIAGNOSIS — Z8673 Personal history of transient ischemic attack (TIA), and cerebral infarction without residual deficits: Secondary | ICD-10-CM

## 2017-05-27 DIAGNOSIS — E8770 Fluid overload, unspecified: Secondary | ICD-10-CM | POA: Diagnosis not present

## 2017-05-27 DIAGNOSIS — Z7982 Long term (current) use of aspirin: Secondary | ICD-10-CM

## 2017-05-27 DIAGNOSIS — K219 Gastro-esophageal reflux disease without esophagitis: Secondary | ICD-10-CM | POA: Diagnosis present

## 2017-05-27 DIAGNOSIS — N179 Acute kidney failure, unspecified: Secondary | ICD-10-CM | POA: Diagnosis not present

## 2017-05-27 DIAGNOSIS — G8929 Other chronic pain: Secondary | ICD-10-CM | POA: Diagnosis present

## 2017-05-27 DIAGNOSIS — E785 Hyperlipidemia, unspecified: Secondary | ICD-10-CM | POA: Diagnosis present

## 2017-05-27 DIAGNOSIS — Z951 Presence of aortocoronary bypass graft: Secondary | ICD-10-CM

## 2017-05-27 DIAGNOSIS — Z87891 Personal history of nicotine dependence: Secondary | ICD-10-CM

## 2017-05-27 DIAGNOSIS — I214 Non-ST elevation (NSTEMI) myocardial infarction: Secondary | ICD-10-CM

## 2017-05-27 DIAGNOSIS — Z6841 Body Mass Index (BMI) 40.0 and over, adult: Secondary | ICD-10-CM

## 2017-05-27 DIAGNOSIS — I4891 Unspecified atrial fibrillation: Secondary | ICD-10-CM | POA: Diagnosis not present

## 2017-05-27 DIAGNOSIS — Q828 Other specified congenital malformations of skin: Secondary | ICD-10-CM

## 2017-05-27 LAB — BASIC METABOLIC PANEL
Anion gap: 8 (ref 5–15)
BUN: 11 mg/dL (ref 6–20)
CHLORIDE: 108 mmol/L (ref 101–111)
CO2: 21 mmol/L — AB (ref 22–32)
CREATININE: 1.04 mg/dL (ref 0.61–1.24)
Calcium: 8.1 mg/dL — ABNORMAL LOW (ref 8.9–10.3)
GFR calc Af Amer: >60 mL/min (ref >60)
GFR calc non Af Amer: >60 mL/min (ref >60)
Glucose, Bld: 108 mg/dL — ABNORMAL HIGH (ref 65–99)
Potassium: 3.6 mmol/L (ref 3.5–5.1)
Sodium: 137 mmol/L (ref 135–145)

## 2017-05-27 LAB — CBC
HCT: 41.1 % (ref 39.0–52.0)
HEMOGLOBIN: 13.6 g/dL (ref 13.0–17.0)
MCH: 29.6 pg (ref 26.0–34.0)
MCHC: 33.1 g/dL (ref 30.0–36.0)
MCV: 89.5 fL (ref 78.0–100.0)
Platelets: 244 10*3/uL (ref 150–400)
RBC: 4.59 MIL/uL (ref 4.22–5.81)
RDW: 13.8 % (ref 11.5–15.5)
WBC: 8.8 10*3/uL (ref 4.0–10.5)

## 2017-05-27 LAB — LIPID PANEL
Cholesterol: 135 mg/dL (ref 0–200)
HDL: 25 mg/dL — ABNORMAL LOW (ref >40)
LDL Cholesterol: 82 mg/dL (ref 0–99)
Total CHOL/HDL Ratio: 5.4 RATIO
Triglycerides: 140 mg/dL (ref <150)
VLDL: 28 mg/dL (ref 0–40)

## 2017-05-27 LAB — HIV ANTIBODY (ROUTINE TESTING W REFLEX): HIV SCREEN 4TH GENERATION: NONREACTIVE

## 2017-05-27 LAB — HEPARIN LEVEL (UNFRACTIONATED)
HEPARIN UNFRACTIONATED: 0.21 [IU]/mL — AB (ref 0.30–0.70)
Heparin Unfractionated: 0.26 IU/mL — ABNORMAL LOW (ref 0.30–0.70)

## 2017-05-27 MED ORDER — ONDANSETRON HCL 4 MG/2ML IJ SOLN: 4.0000 mg | Freq: Four times a day (QID) | INTRAMUSCULAR | Status: DC | PRN

## 2017-05-27 MED ORDER — ACETAMINOPHEN 325 MG PO TABS: 650.0000 mg | ORAL_TABLET | ORAL | Status: DC | PRN

## 2017-05-27 MED ORDER — ISOSORBIDE MONONITRATE ER 30 MG PO TB24
30.0000 mg | ORAL_TABLET | Freq: Every day | ORAL | Status: DC
  Administered 2017-05-27 – 2017-05-28 (×2): 30 mg via ORAL
  Filled 2017-05-27 (×2): qty 1

## 2017-05-27 MED ORDER — PANTOPRAZOLE SODIUM 40 MG PO TBEC
40.0000 mg | DELAYED_RELEASE_TABLET | Freq: Every day | ORAL | Status: DC
  Administered 2017-05-27 – 2017-05-28 (×2): 40 mg via ORAL
  Filled 2017-05-27 (×2): qty 1

## 2017-05-27 MED ORDER — POTASSIUM CHLORIDE CRYS ER 20 MEQ PO TBCR
20.0000 meq | EXTENDED_RELEASE_TABLET | Freq: Every day | ORAL | Status: DC
  Administered 2017-05-27 – 2017-05-28 (×2): 20 meq via ORAL
  Filled 2017-05-27 (×2): qty 1

## 2017-05-27 MED ORDER — ATORVASTATIN CALCIUM 80 MG PO TABS
80.0000 mg | ORAL_TABLET | Freq: Every day | ORAL | Status: DC
  Administered 2017-05-27 – 2017-06-03 (×7): 80 mg via ORAL
  Filled 2017-05-27 (×7): qty 1

## 2017-05-27 MED ORDER — NITROGLYCERIN 0.4 MG SL SUBL: 0.4000 mg | SUBLINGUAL_TABLET | SUBLINGUAL | Status: DC | PRN

## 2017-05-27 MED ORDER — ASPIRIN EC 81 MG PO TBEC
81.0000 mg | DELAYED_RELEASE_TABLET | Freq: Every day | ORAL | Status: DC
  Administered 2017-05-27 – 2017-05-28 (×2): 81 mg via ORAL
  Filled 2017-05-27 (×2): qty 1

## 2017-05-27 MED ORDER — METOPROLOL TARTRATE 50 MG PO TABS
50.0000 mg | ORAL_TABLET | Freq: Two times a day (BID) | ORAL | Status: DC
  Administered 2017-05-27 – 2017-05-29 (×4): 50 mg via ORAL
  Filled 2017-05-27 (×4): qty 1

## 2017-05-27 MED ORDER — NITROGLYCERIN IN D5W 200-5 MCG/ML-% IV SOLN: 0.0000 ug/min | INTRAVENOUS | Status: DC

## 2017-05-27 MED ORDER — HEPARIN (PORCINE) IN NACL 100-0.45 UNIT/ML-% IJ SOLN
2200.0000 [IU]/h | INTRAMUSCULAR | Status: DC
  Administered 2017-05-27: 1950 [IU]/h via INTRAVENOUS
  Administered 2017-05-27: 1600 [IU]/h via INTRAVENOUS
  Administered 2017-05-28: 2300 [IU]/h via INTRAVENOUS
  Administered 2017-05-28: 2200 [IU]/h via INTRAVENOUS
  Filled 2017-05-27 (×4): qty 250

## 2017-05-27 MED ORDER — LISINOPRIL 10 MG PO TABS
20.0000 mg | ORAL_TABLET | Freq: Every day | ORAL | Status: DC
  Administered 2017-05-27 – 2017-05-28 (×2): 20 mg via ORAL
  Filled 2017-05-27 (×2): qty 2

## 2017-05-27 MED ORDER — METOPROLOL TARTRATE 25 MG PO TABS
25.0000 mg | ORAL_TABLET | Freq: Two times a day (BID) | ORAL | Status: DC
  Administered 2017-05-27: 25 mg via ORAL
  Filled 2017-05-27: qty 1

## 2017-05-27 NOTE — Progress Notes (Addendum)
ANTICOAGULATION CONSULT NOTE - Initial Consult  Pharmacy Consult for heparin Indication: CAD awaiting CABG consult  Allergies  Allergen Reactions  . Penicillins Other (See Comments)    Has patient had a PCN reaction causing immediate rash, facial/tongue/throat swelling, SOB or lightheadedness with hypotension: Unknown Has patient had a PCN reaction causing severe rash involving mucus membranes or skin necrosis: Unknown Has patient had a PCN reaction that required hospitalization: Unknown Has patient had a PCN reaction occurring within the last 10 years: Unknown If all of the above answers are "NO", then may proceed with Cephalosporin use.     Patient Measurements: Height: 5\' 8"  (172.7 cm) Weight: 299 lb 13.2 oz (136 kg) IBW/kg (Calculated) : 68.4 Heparin Dosing Weight: 100kg  Vital Signs: Temp: 98.2 F (36.8 C) (04/23 0209) Temp Source: Oral (04/23 0209) BP: 156/87 (04/23 0209) Pulse Rate: 107 (04/23 0209)  Labs: Recent Labs    05/25/17 1355 05/25/17 1507 05/25/17 1735 05/25/17 2207 05/26/17 0522  HGB  --  15.1  --   --  14.1  HCT  --  44.3  --   --  40.7  PLT  --  281  --   --  270  APTT  --  95*  --  76*  --   LABPROT  --  13.1  --   --   --   INR  --  1.00  --   --   --   HEPARINUNFRC  --   --   --  0.43 0.22*  CREATININE 1.10  --   --   --  0.99  TROPONINI 0.04*  --  3.80* 9.17* 8.21*    Estimated Creatinine Clearance: 117.8 mL/min (by C-G formula based on SCr of 0.99 mg/dL).   Medical History: Past Medical History:  Diagnosis Date  . Essential hypertension   . GERD (gastroesophageal reflux disease)   . Morbid obesity (HCC)   . PXE (pseudoxanthoma elasticum)   . Snores   . Stroke [facility])    a. Age 86     Assessment: 51yo male transferred from [facility] after cardiac cath revealed three-vessel disease for CABG w/u, to continue heparin resumed post-cath.  Goal of Therapy:  Heparin level 0.3-0.7 units/ml Monitor platelets by anticoagulation protocol:  Yes   Plan:  Pt arrived with heparin running at 1600 units/hr; will continue for now and monitor heparin levels and CBC.  Vernard Gambles, PharmD, BCPS  05/27/2017,2:16 AM   Addendum: Heparin level (0.21) below goal. Will increase heparin gtt by 2 units/kgABW/hr to 1800 units/hr and check level in 6 hours. VB 5:50 AM

## 2017-05-27 NOTE — Progress Notes (Signed)
[id number] Gave pt OHS booklet, care guide, and in the tube handout. Discussed sternal precautions. Pt has some knee issues that may need to be taken into consideration when first getting OOB after surgery. Gave pt IS and he demonstrated 1250 ml correctly. Discussed importance of IS and mobility after surgery. Wrote down how to view pre op video. Pt lives with mother and brothers so he will have someone available 24/7 after discharge. Luetta Nutting RN BSN 05/27/2017 3:04 PM

## 2017-05-27 NOTE — Progress Notes (Signed)
ANTICOAGULATION CONSULT NOTE Pharmacy Consult for heparin Indication: CAD awaiting CABG consult  Allergies  Allergen Reactions  . Penicillins Other (See Comments)    Has patient had a PCN reaction causing immediate rash, facial/tongue/throat swelling, SOB or lightheadedness with hypotension: Unknown Has patient had a PCN reaction causing severe rash involving mucus membranes or skin necrosis: Unknown Has patient had a PCN reaction that required hospitalization: Unknown Has patient had a PCN reaction occurring within the last 10 years: Unknown If all of the above answers are "NO", then may proceed with Cephalosporin use.     Patient Measurements: Height: 5\' 8"  (172.7 cm) Weight: 299 lb 13.2 oz (136 kg) IBW/kg (Calculated) : 68.4 Heparin Dosing Weight: 100kg   Labs: Recent Labs    05/25/17 1355  05/25/17 1507 05/25/17 1735  05/25/17 2207 05/26/17 0522 05/27/17 0446 05/27/17 1101  HGB  --    < > 15.1  --   --   --  14.1 13.6  --   HCT  --   --  44.3  --   --   --  40.7 41.1  --   PLT  --   --  281  --   --   --  270 244  --   APTT  --   --  95*  --   --  76*  --   --   --   LABPROT  --   --  13.1  --   --   --   --   --   --   INR  --   --  1.00  --   --   --   --   --   --   HEPARINUNFRC  --   --   --   --    < > 0.43 0.22* 0.21* 0.26*  CREATININE 1.10  --   --   --   --   --  0.99 1.04  --   TROPONINI 0.04*  --   --  3.80*  --  9.17* 8.21*  --   --    < > = values in this interval not displayed.    Estimated Creatinine Clearance: 112.1 mL/min (by C-G formula based on SCr of 1.04 mg/dL).   Assessment: 51yo male transferred from [facility] after cardiac cath revealed three-vessel disease for CABG w/u, to continue heparin resumed post-cath.  Heparin level = 0.26  Goal of Therapy:  Heparin level 0.3-0.7 units/ml Monitor platelets by anticoagulation protocol: Yes   Plan:  Heparin to 1950 units / hr Daily heparin level, CBC  Thank you Okey Regal,  PharmD [phone] 05/27/2017,1:26 PM

## 2017-05-27 NOTE — H&P (View-Only) (Signed)
Reason for Consult:3 vessel CAD Referring Physician: Dr. Zoila Shutter. is an 52 y.o. male.  HPI: 52 yo man with a past history of hypertension, pseudoxanthoma elasticum, stroke at age 54, morbid obesity, and gastroesophageal reflux.  He has no known history of coronary disease but did go through a cardiac work-up in 2015 for chest pain, shortness of breath and diaphoresis.  Stress testing was normal at that time.  He was diagnosed with reflux and started on Prilosec and his symptoms did improve for a time.  He continued to have intermittent exertional dyspnea and diaphoresis.  He has been having more that recently and then over the past 3 to 4 weeks has been having chest pain associated with that.  On Sunday he had a prolonged spell where he developed substernal chest pressure.  He took an aspirin and his Zantac but did not experience any relief.  He became diaphoretic and short of breath and went to the emergency room.  His ECG showed inferolateral ST depression his initial troponin was 0.04, but rose to 9.17.  CTA of the chest was negative for pulmonary embolus.  He underwent cardiac catheterization which revealed severe three-vessel coronary disease.  He was transferred to [facility] for management.  He currently is not having any pain shortness of breath or diaphoresis.  Past Medical History:  Diagnosis Date  . Essential hypertension   . GERD (gastroesophageal reflux disease)   . Morbid obesity ([place])   . PXE (pseudoxanthoma elasticum)   . Snores   . Stroke [facility])    a. Age 76    Past Surgical History:  Procedure Laterality Date  . KNEE SURGERY Bilateral   . LEFT HEART CATH AND CORONARY ANGIOGRAPHY N/A 05/26/2017   Procedure: LEFT HEART CATH AND CORONARY ANGIOGRAPHY;  Surgeon: Minna Merritts, MD;  Location: [place] [facility];  Service: Cardiovascular;  Laterality: N/A;    Family History  Problem Relation Age of Onset  . Dementia Mother   . Other Father        died  in his 87's  . COPD Father   . Multiple sclerosis Sister     Social History:  reports that he quit smoking about 3 years ago. His smoking use included cigarettes. He has a 15.00 pack-year smoking history. He has never used smokeless tobacco. He reports that he drinks alcohol. He reports that he does not use drugs.  Allergies:  Allergies  Allergen Reactions  . Penicillins Other (See Comments)    Has patient had a PCN reaction causing immediate rash, facial/tongue/throat swelling, SOB or lightheadedness with hypotension: Unknown Has patient had a PCN reaction causing severe rash involving mucus membranes or skin necrosis: Unknown Has patient had a PCN reaction that required hospitalization: Unknown Has patient had a PCN reaction occurring within the last 10 years: Unknown If all of the above answers are "NO", then may proceed with Cephalosporin use.     Medications:  Scheduled: . aspirin EC  81 mg Oral Daily  . atorvastatin  80 mg Oral q1800  . isosorbide mononitrate  30 mg Oral Daily  . lisinopril  20 mg Oral Daily  . metoprolol tartrate  50 mg Oral BID  . pantoprazole  40 mg Oral Daily  . potassium chloride SA  20 mEq Oral Daily    Results for orders placed or performed during the hospital encounter of 05/27/17 (from the past 48 hour(s))  Basic metabolic panel     Status: Abnormal  Collection Time: 05/27/17  4:46 AM  Result Value Ref Range   Sodium 137 135 - 145 mmol/L   Potassium 3.6 3.5 - 5.1 mmol/L   Chloride 108 101 - 111 mmol/L   CO2 21 (L) 22 - 32 mmol/L   Glucose, Bld 108 (H) 65 - 99 mg/dL   BUN 11 6 - 20 mg/dL   Creatinine, Ser 1.04 0.61 - 1.24 mg/dL   Calcium 8.1 (L) 8.9 - 10.3 mg/dL   GFR calc non Af Amer >60 >60 mL/min   GFR calc Af Amer >60 >60 mL/min    Comment: (NOTE) The eGFR has been calculated using the CKD EPI equation. This calculation has not been validated in all clinical situations. eGFR's persistently <60 mL/min signify possible Chronic  Kidney Disease.    Anion gap 8 5 - 15    Comment: Performed at [facility] [address], [place], [place] [address]  Lipid panel     Status: Abnormal   Collection Time: 05/27/17  4:46 AM  Result Value Ref Range   Cholesterol 135 0 - 200 mg/dL   Triglycerides 140 <150 mg/dL   HDL 25 (L) >40 mg/dL   Total CHOL/HDL Ratio 5.4 RATIO   VLDL 28 0 - 40 mg/dL   LDL Cholesterol 82 0 - 99 mg/dL    Comment:        Total Cholesterol/HDL:CHD Risk Coronary Heart Disease Risk Table                     Men   Women  1/2 Average Risk   3.4   3.3  Average Risk       5.0   4.4  2 X Average Risk   9.6   7.1  3 X Average Risk  23.4   11.0        Use the calculated Patient Ratio above and the CHD Risk Table to determine the patient's CHD Risk.        ATP III CLASSIFICATION (LDL):  <100     mg/dL   Optimal  100-129  mg/dL   Near or Above                    Optimal  130-159  mg/dL   Borderline  160-189  mg/dL   High  >190     mg/dL   Very High Performed at [facility] [address] [place] [address]   CBC     Status: None   Collection Time: 05/27/17  4:46 AM  Result Value Ref Range   WBC 8.8 4.0 - 10.5 K/uL   RBC 4.59 4.22 - 5.81 MIL/uL   Hemoglobin 13.6 13.0 - 17.0 g/dL   HCT 41.1 39.0 - 52.0 %   MCV 89.5 78.0 - 100.0 fL   MCH 29.6 26.0 - 34.0 pg   MCHC 33.1 30.0 - 36.0 g/dL   RDW 13.8 11.5 - 15.5 %   Platelets 244 150 - 400 K/uL    Comment: Performed at [facility], [place] [address] [place], [place] [address]  Heparin level (unfractionated)     Status: Abnormal   Collection Time: 05/27/17  4:46 AM  Result Value Ref Range   Heparin Unfractionated 0.21 (L) 0.30 - 0.70 IU/mL    Comment:        IF HEPARIN RESULTS ARE BELOW EXPECTED VALUES, AND PATIENT DOSAGE HAS BEEN CONFIRMED, SUGGEST FOLLOW UP TESTING OF ANTITHROMBIN  III LEVELS. Performed at [facility], [place] [address], [place], [place] [address]   Heparin level (unfractionated)     Status:  Abnormal   Collection Time: 05/27/17 11:01 AM  Result Value Ref Range   Heparin Unfractionated 0.26 (L) 0.30 - 0.70 IU/mL    Comment:        IF HEPARIN RESULTS ARE BELOW EXPECTED VALUES, AND PATIENT DOSAGE HAS BEEN CONFIRMED, SUGGEST FOLLOW UP TESTING OF ANTITHROMBIN III LEVELS. Performed at [facility], [place] [address], [place], [place] [address]     No results found.  Review of Systems  Constitutional: Positive for diaphoresis and malaise/fatigue. Negative for chills and fever.  HENT: Negative for hearing loss and sore throat.   Eyes: Negative for blurred vision and double vision.  Respiratory: Positive for shortness of breath. Negative for wheezing.        Snores  Cardiovascular: Positive for chest pain. Negative for palpitations, orthopnea and claudication.  Gastrointestinal: Positive for heartburn. Negative for blood in stool, nausea and vomiting.  Genitourinary: Negative for dysuria and urgency.  Skin: Negative for itching and rash.  Neurological: Negative for loss of consciousness and weakness.   Blood pressure (!) 166/87, pulse 76, temperature 98.2 F (36.8 C), temperature source Oral, resp. rate 14, height '5\' 8"'$  (1.727 m), weight 299 lb 13.2 oz (136 kg), SpO2 96 %. Physical Exam  Vitals reviewed. Constitutional: No distress.  Morbidly obese  HENT:  Head: Normocephalic and atraumatic.  Mouth/Throat: No oropharyngeal exudate.  Eyes: Pupils are equal, round, and reactive to light. Conjunctivae are normal. Left eye exhibits no discharge. No scleral icterus.  Neck: Neck supple. No thyromegaly present.  Cardiovascular: Normal rate, regular rhythm, normal heart sounds and intact distal pulses. Exam reveals no gallop and no friction rub.  No murmur heard. Difficult to palpate a left radial pulse  Respiratory: Effort normal and breath sounds normal. No respiratory distress. He has no wheezes. He has no rales.  GI: Soft. He exhibits no distension. There is no  tenderness.  Musculoskeletal: He exhibits no edema.  Lymphadenopathy:    He has no cervical adenopathy.  Neurological: He is alert. No cranial nerve deficit. He exhibits normal muscle tone.  Good motor strength all 4 extremities  Skin: Skin is warm and dry. He is not diaphoretic.   Echocardiogram Study Conclusions  - Left ventricle: The cavity size was normal. Systolic function was   normal. The estimated ejection fraction was in the range of 55%   to 65%. Wall motion was normal; there were no regional wall   motion abnormalities. Doppler parameters are consistent with   abnormal left ventricular relaxation (grade 1 diastolic   dysfunction). - Left atrium: The atrium was normal in size. - Right ventricle: Systolic function was normal. - Pulmonary arteries: Systolic pressure was within the normal   range.  Cardiac catheterization Conclusion    Prox Cx lesion is 80% stenosed.  Prox LAD lesion is 95% stenosed.  Mid LAD lesion is 95% stenosed.  Ost 2nd Diag to 2nd Diag lesion is 95% stenosed.  Mid LAD to Dist LAD lesion is 90% stenosed.  Mid RCA lesion is 70% stenosed.  Dist RCA lesion is 80% stenosed.  The left ventricular ejection fraction is 55-65% by visual estimate.  LV end diastolic pressure is normal.  The left ventricular systolic function is normal.      I personally reviewed the cath images and concur with the findings noted above  Assessment/Plan: Mr. Wadlow  is a 52 year old man with multiple cardiac risk factors including obesity, hypertension, and pseudoxanthoma elasticum.  He presents with an unstable coronary syndrome and ruled in for a non-ST elevation MI.  At catheterization he has severe three-vessel coronary disease with preserved left ventricular function.  Coronary artery bypass grafting is indicated for survival benefit and relief of symptoms.  I discussed the general nature of the procedure, the need for general anesthesia, the use of  cardiopulmonary bypass, and the incisions to be used with Mr. Hoying. We discussed the expected hospital stay, overall recovery and short and long term outcomes.  I informed him of the indications, risks, benefits, and alternatives.  He understands the risks include, but are not limited to death, stroke, MI, DVT/PE, bleeding, possible need for transfusion, infections, cardiac arrhythmias, as well as other organ system dysfunction including respiratory, renal, or GI complications.    He accepts the risks and agrees to proceed.  Tentatively scheduled for surgery on Thursday 4/25  Melrose Nakayama 05/27/2017, 5:03 PM

## 2017-05-27 NOTE — Progress Notes (Addendum)
Progress Note  Patient Name: Jon Garner. Date of Encounter: 05/27/2017  Primary Cardiologist: Julien Nordmann, MD  Subjective   Pt doing well this AM. Denies CP or associated symptoms. Appears relaxed about possible upcoming surgery.   Inpatient Medications    Scheduled Meds: . aspirin EC  81 mg Oral Daily  . atorvastatin  80 mg Oral q1800  . lisinopril  20 mg Oral Daily  . metoprolol tartrate  25 mg Oral BID  . pantoprazole  40 mg Oral Daily  . potassium chloride SA  20 mEq Oral Daily   Continuous Infusions: . heparin 1,600 Units/hr (05/27/17 0228)  . nitroGLYCERIN     PRN Meds: acetaminophen, nitroGLYCERIN, ondansetron (ZOFRAN) IV   Vital Signs    Vitals:   05/27/17 0209 05/27/17 0700  BP: (!) 156/87 (!) 166/87  Pulse: (!) 107   Resp: 14   Temp: 98.2 F (36.8 C) 98.2 F (36.8 C)  TempSrc: Oral Oral  SpO2: 96%   Weight: 299 lb 13.2 oz (136 kg)   Height: 5\' 8"  (1.727 m)     Intake/Output Summary (Last 24 hours) at 05/27/2017 0815 Last data filed at 05/27/2017 0228 Gross per 24 hour  Intake 250 ml  Output -  Net 250 ml   Filed Weights   05/27/17 0209  Weight: 299 lb 13.2 oz (136 kg)    Physical Exam   General: Well developed, well nourished, NAD Skin: Warm, dry, intact  Head: Normocephalic, atraumatic, clear, moist mucus membranes. Neck: Negative for carotid bruits. No JVD Lungs:Clear to ausculation bilaterally. No wheezes, rales, or rhonchi. Breathing is unlabored. Cardiovascular: RRR with S1 S2. No murmurs, rubs or gallops Abdomen: Soft, non-tender, non-distended with normoactive bowel sounds. No obvious abdominal masses. MSK: Strength and tone appear normal for age. 5/5 in all extremities Extremities: No edema. No clubbing or cyanosis. DP/PT pulses 2+ bilaterally Neuro: Alert and oriented. No focal deficits. No facial asymmetry. MAE spontaneously. Psych: Responds to questions appropriately with normal affect.    Labs     Chemistry Recent Labs  Lab 05/25/17 1355 05/26/17 0522 05/27/17 0446  NA 139 138 137  K 3.9 3.5 3.6  CL 104 106 108  CO2 26 24 21*  GLUCOSE 145* 118* 108*  BUN 12 12 11   CREATININE 1.10 0.99 1.04  CALCIUM 9.0 8.1* 8.1*  PROT 8.2*  --   --   ALBUMIN 4.6  --   --   AST 43*  --   --   ALT 43  --   --   ALKPHOS 117  --   --   BILITOT 0.9  --   --   GFRNONAA >60 >60 >60  GFRAA >60 >60 >60  ANIONGAP 9 8 8      Hematology Recent Labs  Lab 05/25/17 1507 05/26/17 0522 05/27/17 0446  WBC 8.8 10.3 8.8  RBC 5.10 4.64 4.59  HGB 15.1 14.1 13.6  HCT 44.3 40.7 41.1  MCV 86.9 87.8 89.5  MCH 29.7 30.4 29.6  MCHC 34.2 34.6 33.1  RDW 13.4 13.5 13.8  PLT 281 270 244    Cardiac Enzymes Recent Labs  Lab 05/25/17 1355 05/25/17 1735 05/25/17 2207 05/26/17 0522  TROPONINI 0.04* 3.80* 9.17* 8.21*   No results for input(s): TROPIPOC in the last 168 hours.   BNP Recent Labs  Lab 05/25/17 1735  BNP 52.0     DDimer No results for input(s): DDIMER in the last 168 hours.   Radiology  Dg Chest Port 1 View  Result Date: 05/25/2017 CLINICAL DATA:  Sudden onset severe chest pain.  Ex-smoker. EXAM: PORTABLE CHEST 1 VIEW COMPARISON:  04/11/2009. FINDINGS: Interval mildly enlarged cardiac silhouette and mild prominence of the pulmonary vasculature and interstitial markings. No visible pleural fluid. No acute bony abnormality. IMPRESSION: Interval mild cardiomegaly and mild changes of congestive heart failure. Electronically Signed   By: Beckie Salts M.D.   On: 05/25/2017 14:43   Ct Angio Chest Aorta W And/or Wo Contrast  Result Date: 05/25/2017 CLINICAL DATA:  Sudden onset severe chest pain. Severe diaphoresis. Ex-smoker. EXAM: CT ANGIOGRAPHY CHEST WITH CONTRAST TECHNIQUE: Multidetector CT imaging of the chest was performed using the standard protocol during bolus administration of intravenous contrast. Multiplanar CT image reconstructions and MIPs were obtained to evaluate the  vascular anatomy. CONTRAST:  ISOVUE-370 IOPAMIDOL (ISOVUE-370) INJECTION 76% COMPARISON:  Portable chest radiograph obtained today. FINDINGS: Cardiovascular: Satisfactory opacification of the pulmonary arteries to the segmental level. No evidence of pulmonary embolism. Normal heart size. No pericardial effusion. No aortic dissection or aneurysm. Multi-vessel coronary artery calcification. Mediastinum/Nodes: No enlarged mediastinal, hilar, or axillary lymph nodes. Thyroid gland, trachea, and esophagus demonstrate no significant findings. Lungs/Pleura: Clear lungs. No pleural fluid. No nodules. Minimally prominent interstitial markings with peripheral Kerley lines bilaterally. Upper Abdomen: Diffuse low density of the liver relative to the spleen. Musculoskeletal: Extensive thoracic and lower cervical spine degenerative changes. Review of the MIP images confirms the above findings. IMPRESSION: 1. No pulmonary emboli, aortic dissection or aortic aneurysm. 2. Multi-vessel atheromatous coronary artery calcifications. 3. Minimal interstitial pulmonary edema/chronic interstitial lung disease. 4. Diffuse hepatic steatosis. Electronically Signed   By: Beckie Salts M.D.   On: 05/25/2017 15:10    Telemetry    05/27/17 NSR with PVC HR 71 Personally Reviewed  ECG    05/25/17 NSR/ST with ST depression in leads I, AVF - Personally Reviewed  Cardiac Studies   Echo 05/26/17: Study Conclusions  - Left ventricle: The cavity size was normal. Systolic function was   normal. The estimated ejection fraction was in the range of 55%   to 65%. Wall motion was normal; there were no regional wall   motion abnormalities. Doppler parameters are consistent with   abnormal left ventricular relaxation (grade 1 diastolic   dysfunction). - Left atrium: The atrium was normal in size. - Right ventricle: Systolic function was normal. - Pulmonary arteries: Systolic pressure was within the normal    range.  ------------------------------------------------------------------- Study data:   Study status:  Routine.  Procedure:  The patient reported no pain pre or post test. Transthoracic echocardiography. Image quality was suboptimal. The study was technically difficult, as a result of poor acoustic windows, poor sound wave transmission, and body habitus.          Transthoracic echocardiography.  M-mode, complete 2D, spectral Doppler, and color Doppler.  Birthdate: Patient birthdate: [birth year].  Age:  Patient is 52 yr old.  Sex: Gender: male.    BMI: 42.4 kg/m^2.  Blood pressure:     130/67 Patient status:  Inpatient.  Study date:  Study date: 05/26/2017. Study time: 08:26 AM.  Location:  Bedside.   Cardiac cath 05/26/17:  Prox Cx lesion is 80% stenosed.  Prox LAD lesion is 95% stenosed.  Mid LAD lesion is 95% stenosed.  Ost 2nd Diag to 2nd Diag lesion is 95% stenosed.  Mid LAD to Dist LAD lesion is 90% stenosed.  Mid RCA lesion is 70% stenosed.  Dist RCA lesion is 80%  stenosed.  The left ventricular ejection fraction is 55-65% by visual estimate.  LV end diastolic pressure is normal.  The left ventricular systolic function is normal.  Patient Profile     52 y.o. male with a history of chest pain, DOE, knee pain, obesity, GERD, HTN, PXE, and remote stroke (felt to be related to PXE), who is being seen today for the evaluation ofNSTEMIat the request of Dr. Elpidio Anis  Assessment & Plan    1. Non-ST segment elevation myocardial infarction:  -Pt presented to the [facility] on 05/25/17 with a 3 to 4 week hx of progressive dyspnea followed by severe substernal chest heaviness on the day of admission -Initial troponin was mildly elevated at 3.80>>with a peak of 9.17   -Cardiac cath completed 05/25/17 which revealed severe multivessel disease -Plan was made to transfer pt to [facility] for TCTS evaluation -Currently CP free and resting comfortably>>trop downtrending, 8.21  -Continue  ASA, BB, ACE-I, high intensity statin -Hep gtt  2. Untreated hypertension:  -Improved but remains elevated, 166/87>156/87 -Continue beta-blocker and ACE inhibitor  3. Hyperlipidemia:  -Elevated, CHO-135, HDL-25, LDL-82, Trig-140 -High intensity statin   4. GERD:  -Continue PPI  5. Morbid obesity:  -BMI 45.5, 299lb  -Eventual cardiac rehab -Activity limited secondary to chronic knee pain  6. Snoring:  -Likely undiagnosed sleep apnea>>will need outpatient sleep study at some point    Signed, [name] NP-C [facility] Pager: [phone] 05/27/2017, 8:15 AM     Patient seen and examined. Agree with assessment and plan. No recurrent chest pain. No resting dyspnea. Cath findings reviewed.  Multivessel CAD at cath. Recent significant accelerated HTN. Will titrate metoprolol to 50 mg bid. Start Imdur 30 mg daily. Surgical consult for CABG.  Strong history for undiagnosed OSA with poor sleep, frequent awakenings, frequent nocturia, loud snoring, cannot sleep on back, awakening gasping for breath, morbid obesity with very thick neck and mallinpatti scale of 4.  Will need sleep study and treatment post CABG.    Lennette Bihari, MD, [facility] 05/27/2017 1:16 PM  For questions or updates, please contact   Please consult [email or web address] for contact info under Cardiology/STEMI.

## 2017-05-27 NOTE — Progress Notes (Signed)
[facility] came to pick  Patient up and take him to [facility] [facility]. Report called to [employer]. Patient is transported to [facility].

## 2017-05-27 NOTE — Consult Note (Signed)
Reason for Consult:3 vessel CAD Referring Physician: Dr. Zoila Shutter. is an 52 y.o. male.  HPI: 52 yo man with a past history of hypertension, pseudoxanthoma elasticum, stroke at age 83, morbid obesity, and gastroesophageal reflux.  He has no known history of coronary disease but did go through a cardiac work-up in 2015 for chest pain, shortness of breath and diaphoresis.  Stress testing was normal at that time.  He was diagnosed with reflux and started on Prilosec and his symptoms did improve for a time.  He continued to have intermittent exertional dyspnea and diaphoresis.  He has been having more that recently and then over the past 3 to 4 weeks has been having chest pain associated with that.  On Sunday he had a prolonged spell where he developed substernal chest pressure.  He took an aspirin and his Zantac but did not experience any relief.  He became diaphoretic and short of breath and went to the emergency room.  His ECG showed inferolateral ST depression his initial troponin was 0.04, but rose to 9.17.  CTA of the chest was negative for pulmonary embolus.  He underwent cardiac catheterization which revealed severe three-vessel coronary disease.  He was transferred to [facility] for management.  He currently is not having any pain shortness of breath or diaphoresis.  Past Medical History:  Diagnosis Date  . Essential hypertension   . GERD (gastroesophageal reflux disease)   . Morbid obesity ([place])   . PXE (pseudoxanthoma elasticum)   . Snores   . Stroke [facility])    a. Age 9    Past Surgical History:  Procedure Laterality Date  . KNEE SURGERY Bilateral   . LEFT HEART CATH AND CORONARY ANGIOGRAPHY N/A 05/26/2017   Procedure: LEFT HEART CATH AND CORONARY ANGIOGRAPHY;  Surgeon: Minna Merritts, MD;  Location: [facility];  Service: Cardiovascular;  Laterality: N/A;    Family History  Problem Relation Age of Onset  . Dementia Mother   . Other Father        died  in his 75's  . COPD Father   . Multiple sclerosis Sister     Social History:  reports that he quit smoking about 3 years ago. His smoking use included cigarettes. He has a 15.00 pack-year smoking history. He has never used smokeless tobacco. He reports that he drinks alcohol. He reports that he does not use drugs.  Allergies:  Allergies  Allergen Reactions  . Penicillins Other (See Comments)    Has patient had a PCN reaction causing immediate rash, facial/tongue/throat swelling, SOB or lightheadedness with hypotension: Unknown Has patient had a PCN reaction causing severe rash involving mucus membranes or skin necrosis: Unknown Has patient had a PCN reaction that required hospitalization: Unknown Has patient had a PCN reaction occurring within the last 10 years: Unknown If all of the above answers are "NO", then may proceed with Cephalosporin use.     Medications:  Scheduled: . aspirin EC  81 mg Oral Daily  . atorvastatin  80 mg Oral q1800  . isosorbide mononitrate  30 mg Oral Daily  . lisinopril  20 mg Oral Daily  . metoprolol tartrate  50 mg Oral BID  . pantoprazole  40 mg Oral Daily  . potassium chloride SA  20 mEq Oral Daily    Results for orders placed or performed during the hospital encounter of 05/27/17 (from the past 48 hour(s))  Basic metabolic panel     Status: Abnormal  Collection Time: 05/27/17  4:46 AM  Result Value Ref Range   Sodium 137 135 - 145 mmol/L   Potassium 3.6 3.5 - 5.1 mmol/L   Chloride 108 101 - 111 mmol/L   CO2 21 (L) 22 - 32 mmol/L   Glucose, Bld 108 (H) 65 - 99 mg/dL   BUN 11 6 - 20 mg/dL   Creatinine, Ser 1.04 0.61 - 1.24 mg/dL   Calcium 8.1 (L) 8.9 - 10.3 mg/dL   GFR calc non Af Amer >60 >60 mL/min   GFR calc Af Amer >60 >60 mL/min    Comment: (NOTE) The eGFR has been calculated using the CKD EPI equation. This calculation has not been validated in all clinical situations. eGFR's persistently <60 mL/min signify possible Chronic  Kidney Disease.    Anion gap 8 5 - 15    Comment: Performed at [facility] [address] [place], [place] [address]  Lipid panel     Status: Abnormal   Collection Time: 05/27/17  4:46 AM  Result Value Ref Range   Cholesterol 135 0 - 200 mg/dL   Triglycerides 140 <150 mg/dL   HDL 25 (L) >40 mg/dL   Total CHOL/HDL Ratio 5.4 RATIO   VLDL 28 0 - 40 mg/dL   LDL Cholesterol 82 0 - 99 mg/dL    Comment:        Total Cholesterol/HDL:CHD Risk Coronary Heart Disease Risk Table                     Men   Women  1/2 Average Risk   3.4   3.3  Average Risk       5.0   4.4  2 X Average Risk   9.6   7.1  3 X Average Risk  23.4   11.0        Use the calculated Patient Ratio above and the CHD Risk Table to determine the patient's CHD Risk.        ATP III CLASSIFICATION (LDL):  <100     mg/dL   Optimal  100-129  mg/dL   Near or Above                    Optimal  130-159  mg/dL   Borderline  160-189  mg/dL   High  >190     mg/dL   Very High Performed at [facility] [address], [place] [address]   CBC     Status: None   Collection Time: 05/27/17  4:46 AM  Result Value Ref Range   WBC 8.8 4.0 - 10.5 K/uL   RBC 4.59 4.22 - 5.81 MIL/uL   Hemoglobin 13.6 13.0 - 17.0 g/dL   HCT 41.1 39.0 - 52.0 %   MCV 89.5 78.0 - 100.0 fL   MCH 29.6 26.0 - 34.0 pg   MCHC 33.1 30.0 - 36.0 g/dL   RDW 13.8 11.5 - 15.5 %   Platelets 244 150 - 400 K/uL    Comment: Performed at [facility], [place] [address] [place], [place] [address]  Heparin level (unfractionated)     Status: Abnormal   Collection Time: 05/27/17  4:46 AM  Result Value Ref Range   Heparin Unfractionated 0.21 (L) 0.30 - 0.70 IU/mL    Comment:        IF HEPARIN RESULTS ARE BELOW EXPECTED VALUES, AND PATIENT DOSAGE HAS BEEN CONFIRMED, SUGGEST FOLLOW UP TESTING OF ANTITHROMBIN  III LEVELS. Performed at [facility], [place] [address] [place], [place] [address]   Heparin level (unfractionated)     Status:  Abnormal   Collection Time: 05/27/17 11:01 AM  Result Value Ref Range   Heparin Unfractionated 0.26 (L) 0.30 - 0.70 IU/mL    Comment:        IF HEPARIN RESULTS ARE BELOW EXPECTED VALUES, AND PATIENT DOSAGE HAS BEEN CONFIRMED, SUGGEST FOLLOW UP TESTING OF ANTITHROMBIN III LEVELS. Performed at [facility], [place] [address], [place], [place] [address]     No results found.  Review of Systems  Constitutional: Positive for diaphoresis and malaise/fatigue. Negative for chills and fever.  HENT: Negative for hearing loss and sore throat.   Eyes: Negative for blurred vision and double vision.  Respiratory: Positive for shortness of breath. Negative for wheezing.        Snores  Cardiovascular: Positive for chest pain. Negative for palpitations, orthopnea and claudication.  Gastrointestinal: Positive for heartburn. Negative for blood in stool, nausea and vomiting.  Genitourinary: Negative for dysuria and urgency.  Skin: Negative for itching and rash.  Neurological: Negative for loss of consciousness and weakness.   Blood pressure (!) 166/87, pulse 76, temperature 98.2 F (36.8 C), temperature source Oral, resp. rate 14, height '5\' 8"'$  (1.727 m), weight 299 lb 13.2 oz (136 kg), SpO2 96 %. Physical Exam  Vitals reviewed. Constitutional: No distress.  Morbidly obese  HENT:  Head: Normocephalic and atraumatic.  Mouth/Throat: No oropharyngeal exudate.  Eyes: Pupils are equal, round, and reactive to light. Conjunctivae are normal. Left eye exhibits no discharge. No scleral icterus.  Neck: Neck supple. No thyromegaly present.  Cardiovascular: Normal rate, regular rhythm, normal heart sounds and intact distal pulses. Exam reveals no gallop and no friction rub.  No murmur heard. Difficult to palpate a left radial pulse  Respiratory: Effort normal and breath sounds normal. No respiratory distress. He has no wheezes. He has no rales.  GI: Soft. He exhibits no distension. There is no  tenderness.  Musculoskeletal: He exhibits no edema.  Lymphadenopathy:    He has no cervical adenopathy.  Neurological: He is alert. No cranial nerve deficit. He exhibits normal muscle tone.  Good motor strength all 4 extremities  Skin: Skin is warm and dry. He is not diaphoretic.   Echocardiogram Study Conclusions  - Left ventricle: The cavity size was normal. Systolic function was   normal. The estimated ejection fraction was in the range of 55%   to 65%. Wall motion was normal; there were no regional wall   motion abnormalities. Doppler parameters are consistent with   abnormal left ventricular relaxation (grade 1 diastolic   dysfunction). - Left atrium: The atrium was normal in size. - Right ventricle: Systolic function was normal. - Pulmonary arteries: Systolic pressure was within the normal   range.  Cardiac catheterization Conclusion    Prox Cx lesion is 80% stenosed.  Prox LAD lesion is 95% stenosed.  Mid LAD lesion is 95% stenosed.  Ost 2nd Diag to 2nd Diag lesion is 95% stenosed.  Mid LAD to Dist LAD lesion is 90% stenosed.  Mid RCA lesion is 70% stenosed.  Dist RCA lesion is 80% stenosed.  The left ventricular ejection fraction is 55-65% by visual estimate.  LV end diastolic pressure is normal.  The left ventricular systolic function is normal.      I personally reviewed the cath images and concur with the findings noted above  Assessment/Plan: Mr. Widener  is a 52 year old man with multiple cardiac risk factors including obesity, hypertension, and pseudoxanthoma elasticum.  He presents with an unstable coronary syndrome and ruled in for a non-ST elevation MI.  At catheterization he has severe three-vessel coronary disease with preserved left ventricular function.  Coronary artery bypass grafting is indicated for survival benefit and relief of symptoms.  I discussed the general nature of the procedure, the need for general anesthesia, the use of  cardiopulmonary bypass, and the incisions to be used with Mr. Groh. We discussed the expected hospital stay, overall recovery and short and long term outcomes.  I informed him of the indications, risks, benefits, and alternatives.  He understands the risks include, but are not limited to death, stroke, MI, DVT/PE, bleeding, possible need for transfusion, infections, cardiac arrhythmias, as well as other organ system dysfunction including respiratory, renal, or GI complications.    He accepts the risks and agrees to proceed.  Tentatively scheduled for surgery on Thursday 4/25  [place] Nakayama 05/27/2017, 5:03 PM

## 2017-05-28 ENCOUNTER — Inpatient Hospital Stay (HOSPITAL_COMMUNITY): Payer: Self-pay

## 2017-05-28 ENCOUNTER — Other Ambulatory Visit: Payer: Self-pay

## 2017-05-28 ENCOUNTER — Encounter (HOSPITAL_COMMUNITY): Payer: Self-pay | Admitting: *Deleted

## 2017-05-28 DIAGNOSIS — Z01810 Encounter for preprocedural cardiovascular examination: Secondary | ICD-10-CM

## 2017-05-28 LAB — PULMONARY FUNCTION TEST
FEF 25-75 Post: 2.32 L/sec
FEF 25-75 Pre: 1.93 L/sec
FEF2575-%CHANGE-POST: 20 %
FEF2575-%PRED-POST: 73 %
FEF2575-%Pred-Pre: 61 %
FEV1-%CHANGE-POST: 1 %
FEV1-%PRED-POST: 60 %
FEV1-%Pred-Pre: 59 %
FEV1-Post: 2.17 L
FEV1-Pre: 2.14 L
FEV1FVC-%Change-Post: 8 %
FEV1FVC-%PRED-PRE: 104 %
FEV6-%Change-Post: -6 %
FEV6-%Pred-Post: 55 %
FEV6-%Pred-Pre: 59 %
FEV6-PRE: 2.67 L
FEV6-Post: 2.5 L
FEV6FVC-%Pred-Post: 104 %
FEV6FVC-%Pred-Pre: 104 %
FVC-%CHANGE-POST: -6 %
FVC-%PRED-POST: 53 %
FVC-%PRED-PRE: 57 %
FVC-POST: 2.5 L
FVC-PRE: 2.67 L
POST FEV1/FVC RATIO: 87 %
PRE FEV6/FVC RATIO: 100 %
Post FEV6/FVC ratio: 100 %
Pre FEV1/FVC ratio: 80 %

## 2017-05-28 LAB — CBC
HEMATOCRIT: 41.4 % (ref 39.0–52.0)
HEMOGLOBIN: 13.4 g/dL (ref 13.0–17.0)
MCH: 29 pg (ref 26.0–34.0)
MCHC: 32.4 g/dL (ref 30.0–36.0)
MCV: 89.6 fL (ref 78.0–100.0)
Platelets: 285 10*3/uL (ref 150–400)
RBC: 4.62 MIL/uL (ref 4.22–5.81)
RDW: 13.7 % (ref 11.5–15.5)
WBC: 9.9 10*3/uL (ref 4.0–10.5)

## 2017-05-28 LAB — HEPARIN LEVEL (UNFRACTIONATED)
HEPARIN UNFRACTIONATED: 0.2 [IU]/mL — AB (ref 0.30–0.70)
Heparin Unfractionated: 0.73 IU/mL — ABNORMAL HIGH (ref 0.30–0.70)

## 2017-05-28 LAB — SURGICAL PCR SCREEN
MRSA, PCR: NEGATIVE
STAPHYLOCOCCUS AUREUS: POSITIVE — AB

## 2017-05-28 MED ORDER — TRANEXAMIC ACID (OHS) BOLUS VIA INFUSION
15.0000 mg/kg | INTRAVENOUS | Status: AC
  Administered 2017-05-29: 2025 mg via INTRAVENOUS
  Filled 2017-05-28: qty 2025

## 2017-05-28 MED ORDER — VANCOMYCIN HCL 10 G IV SOLR
1500.0000 mg | INTRAVENOUS | Status: AC
  Administered 2017-05-29: 1500 mg via INTRAVENOUS
  Filled 2017-05-28: qty 500

## 2017-05-28 MED ORDER — DOPAMINE-DEXTROSE 3.2-5 MG/ML-% IV SOLN
0.0000 ug/kg/min | INTRAVENOUS | Status: AC
Start: 2017-05-29 — End: 2017-05-29
  Administered 2017-05-29: 3 ug/kg/min via INTRAVENOUS
  Filled 2017-05-28: qty 250

## 2017-05-28 MED ORDER — ALBUTEROL SULFATE (2.5 MG/3ML) 0.083% IN NEBU
2.5000 mg | INHALATION_SOLUTION | Freq: Once | RESPIRATORY_TRACT | Status: AC
  Administered 2017-05-28: 2.5 mg via RESPIRATORY_TRACT

## 2017-05-28 MED ORDER — PLASMA-LYTE 148 IV SOLN
INTRAVENOUS | Status: DC
  Filled 2017-05-28: qty 2.5

## 2017-05-28 MED ORDER — TRANEXAMIC ACID (OHS) PUMP PRIME SOLUTION
2.0000 mg/kg | INTRAVENOUS | Status: DC
  Filled 2017-05-28: qty 2.7

## 2017-05-28 MED ORDER — TRANEXAMIC ACID 1000 MG/10ML IV SOLN
1.5000 mg/kg/h | INTRAVENOUS | Status: AC
  Administered 2017-05-29: 1.5 mg/kg/h via INTRAVENOUS
  Filled 2017-05-28: qty 25

## 2017-05-28 MED ORDER — POTASSIUM CHLORIDE 2 MEQ/ML IV SOLN
80.0000 meq | INTRAVENOUS | Status: DC
Start: 2017-05-29 — End: 2017-05-29
  Filled 2017-05-28: qty 40

## 2017-05-28 MED ORDER — LEVOFLOXACIN IN D5W 500 MG/100ML IV SOLN
500.0000 mg | INTRAVENOUS | Status: AC
  Administered 2017-05-29: 500 mg via INTRAVENOUS
  Filled 2017-05-28: qty 100

## 2017-05-28 MED ORDER — DEXMEDETOMIDINE HCL IN NACL 400 MCG/100ML IV SOLN
0.1000 ug/kg/h | INTRAVENOUS | Status: AC
  Administered 2017-05-29: 0.7 ug/kg/h via INTRAVENOUS
  Filled 2017-05-28: qty 100

## 2017-05-28 MED ORDER — DEXTROSE 5 % IV SOLN
0.0000 ug/min | INTRAVENOUS | Status: DC
  Filled 2017-05-28: qty 4

## 2017-05-28 MED ORDER — NITROGLYCERIN IN D5W 200-5 MCG/ML-% IV SOLN
2.0000 ug/min | INTRAVENOUS | Status: AC
  Administered 2017-05-29: 16.6 ug/min via INTRAVENOUS
  Filled 2017-05-28: qty 250

## 2017-05-28 MED ORDER — MAGNESIUM SULFATE 50 % IJ SOLN
40.0000 meq | INTRAMUSCULAR | Status: DC
  Filled 2017-05-28: qty 9.85

## 2017-05-28 MED ORDER — SODIUM CHLORIDE 0.9 % IV SOLN
INTRAVENOUS | Status: AC
  Administered 2017-05-29: .5 [IU]/h via INTRAVENOUS
  Filled 2017-05-28: qty 1

## 2017-05-28 MED ORDER — PHENYLEPHRINE HCL 10 MG/ML IJ SOLN
30.0000 ug/min | INTRAMUSCULAR | Status: DC
  Filled 2017-05-28: qty 2

## 2017-05-28 MED ORDER — SODIUM CHLORIDE 0.9 % IV SOLN
INTRAVENOUS | Status: DC
  Filled 2017-05-28: qty 30

## 2017-05-28 NOTE — Progress Notes (Signed)
ANTICOAGULATION CONSULT NOTE Pharmacy Consult for heparin Indication: CAD awaiting CABG consult  Allergies  Allergen Reactions  . Penicillins Other (See Comments)    Has patient had a PCN reaction causing immediate rash, facial/tongue/throat swelling, SOB or lightheadedness with hypotension: Unknown Has patient had a PCN reaction causing severe rash involving mucus membranes or skin necrosis: Unknown Has patient had a PCN reaction that required hospitalization: Unknown Has patient had a PCN reaction occurring within the last 10 years: Unknown If all of the above answers are "NO", then may proceed with Cephalosporin use.     Patient Measurements: Height: 5\' 8"  (172.7 cm) Weight: 297 lb 9.6 oz (135 kg) IBW/kg (Calculated) : 68.4 Heparin Dosing Weight: 100kg   Labs: Recent Labs    05/25/17 1355  05/25/17 1507 05/25/17 1735  05/25/17 2207 05/26/17 0522 05/27/17 0446 05/27/17 1101 05/28/17 0231 05/28/17 1109  HGB  --    < > 15.1  --   --   --  14.1 13.6  --  13.4  --   HCT  --    < > 44.3  --   --   --  40.7 41.1  --  41.4  --   PLT  --    < > 281  --   --   --  270 244  --  285  --   APTT  --   --  95*  --   --  76*  --   --   --   --   --   LABPROT  --   --  13.1  --   --   --   --   --   --   --   --   INR  --   --  1.00  --   --   --   --   --   --   --   --   HEPARINUNFRC  --   --   --   --    < > 0.43 0.22* 0.21* 0.26* 0.20* 0.73*  CREATININE 1.10  --   --   --   --   --  0.99 1.04  --   --   --   TROPONINI 0.04*  --   --  3.80*  --  9.17* 8.21*  --   --   --   --    < > = values in this interval not displayed.    Estimated Creatinine Clearance: 111.6 mL/min (by C-G formula based on SCr of 1.04 mg/dL).   Assessment: 51yo male transferred from [facility] after cardiac cath revealed three-vessel disease for CABG w/u, to continue heparin resumed post-cath.  Heparin level = 0.73  Goal of Therapy:  Heparin level 0.3-0.7 units/ml Monitor platelets by anticoagulation  protocol: Yes   Plan:  Decrease heparin to 2200 units / hr Follow up after cath tomorrow  Thank you Okey Regal, PharmD [phone] 05/28/2017,1:28 PM

## 2017-05-28 NOTE — Progress Notes (Signed)
Progress Note  Patient Name: Jon Garner. Date of Encounter: 05/28/2017  Primary Cardiologist: Mariah Milling  Subjective   No recurrent chest pain.  Inpatient Medications    Scheduled Meds: . aspirin EC  81 mg Oral Daily  . atorvastatin  80 mg Oral q1800  . isosorbide mononitrate  30 mg Oral Daily  . lisinopril  20 mg Oral Daily  . metoprolol tartrate  50 mg Oral BID  . pantoprazole  40 mg Oral Daily  . potassium chloride SA  20 mEq Oral Daily   Continuous Infusions: . heparin 2,300 Units/hr (05/28/17 1051)   PRN Meds: acetaminophen, nitroGLYCERIN, ondansetron (ZOFRAN) IV   Vital Signs    Vitals:   05/27/17 0822 05/27/17 2050 05/28/17 0321 05/28/17 1050  BP:  (!) 156/90 137/74   Pulse: 76  74   Resp:  20 (!) 26   Temp:  98.2 F (36.8 C) 97.9 F (36.6 C) 97.6 F (36.4 C)  TempSrc:  Oral Oral Oral  SpO2:  98% 95% 97%  Weight:   297 lb 9.6 oz (135 kg)   Height:        Intake/Output Summary (Last 24 hours) at 05/28/2017 1239 Last data filed at 05/28/2017 0900 Gross per 24 hour  Intake 890.85 ml  Output 200 ml  Net 690.85 ml    I/O since admission: +1180  [facility] We[facility]   05/27/17 0209 05/28/17 0321  Weight: 299 lb 13.2 oz (136 kg) 297 lb 9.6 oz (135 kg)    Telemetry    Sinus- Personally Reviewed  ECG    ECG (independently read by me): ST at 114; inferolateral ST changes  Physical Exam   BP 137/74 (BP Location: Right Arm)   Pulse 74   Temp 97.6 F (36.4 C) (Oral)   Resp (!) 26   Ht 5\' 8"  (1.727 m)   Wt 297 lb 9.6 oz (135 kg)   SpO2 97%   BMI 45.25 kg/m  General: Alert, oriented, no distress.  Skin: normal turgor, no rashes, warm and dry HEENT: Normocephalic, atraumatic. Pupils equal round and reactive to light; sclera anicteric; extraocular muscles intact;  Nose without nasal septal hypertrophy Mouth/Parynx benign; Mallinpatti scale 4 Neck: Thick neck; No JVD, no carotid bruits; normal carotid upstroke Lungs: clear to ausculatation and  percussion; no wheezing or rales Chest wall: without tenderness to palpitation Heart: PMI not displaced, RRR, s1 s2 normal, 1/6 systolic murmur, no diastolic murmur, no rubs, gallops, thrills, or heaves Abdomen: soft, nontender; no hepatosplenomehaly, BS+; abdominal aorta nontender and not dilated by palpation. Back: no CVA tenderness Pulses 2+ Musculoskeletal: full range of motion, normal strength, no joint deformities Extremities: no clubbing cyanosis or edema, Homan's sign negative  Neurologic: grossly nonfocal; Cranial nerves grossly wnl Psychologic: Normal mood and affect   Labs    Chemistry Recent Labs  Lab 05/25/17 1355 05/26/17 0522 05/27/17 0446  NA 139 138 137  K 3.9 3.5 3.6  CL 104 106 108  CO2 26 24 21*  GLUCOSE 145* 118* 108*  BUN 12 12 11   CREATININE 1.10 0.99 1.04  CALCIUM 9.0 8.1* 8.1*  PROT 8.2*  --   --   ALBUMIN 4.6  --   --   AST 43*  --   --   ALT 43  --   --   ALKPHOS 117  --   --   BILITOT 0.9  --   --   GFRNONAA >60 >60 >60  GFRAA >60 >60 >60  ANIONGAP 9 8 8      Hematology Recent Labs  Lab 05/26/17 0522 05/27/17 0446 05/28/17 0231  WBC 10.3 8.8 9.9  RBC 4.64 4.59 4.62  HGB 14.1 13.6 13.4  HCT 40.7 41.1 41.4  MCV 87.8 89.5 89.6  MCH 30.4 29.6 29.0  MCHC 34.6 33.1 32.4  RDW 13.5 13.8 13.7  PLT 270 244 285    Cardiac Enzymes Recent Labs  Lab 05/25/17 1355 05/25/17 1735 05/25/17 2207 05/26/17 0522  TROPONINI 0.04* 3.80* 9.17* 8.21*   No results for input(s): TROPIPOC in the last 168 hours.   BNP Recent Labs  Lab 05/25/17 1735  BNP 52.0     DDimer No results for input(s): DDIMER in the last 168 hours.  Lipid Panel     Component Value Date/Time   CHOL 135 05/27/2017 0446   TRIG 140 05/27/2017 0446   HDL 25 (L) 05/27/2017 0446   CHOLHDL 5.4 05/27/2017 0446   VLDL 28 05/27/2017 0446   LDLCALC 82 05/27/2017 0446    Radiology    No results found.  Cardiac Studies    Echo 05/26/17: Study Conclusions  - Left  ventricle: The cavity size was normal. Systolic function was normal. The estimated ejection fraction was in the range of 55% to 65%. Wall motion was normal; there were no regional wall motion abnormalities. Doppler parameters are consistent with abnormal left ventricular relaxation (grade 1 diastolic dysfunction). - Left atrium: The atrium was normal in size. - Right ventricle: Systolic function was normal. - Pulmonary arteries: Systolic pressure was within the normal range.   Cardiac cath 05/26/17:  Prox Cx lesion is 80% stenosed.  Prox LAD lesion is 95% stenosed.  Mid LAD lesion is 95% stenosed.  Ost 2nd Diag to 2nd Diag lesion is 95% stenosed.  Mid LAD to Dist LAD lesion is 90% stenosed.  Mid RCA lesion is 70% stenosed.  Dist RCA lesion is 80% stenosed.  The left ventricular ejection fraction is 55-65% by visual estimate.  LV end diastolic pressure is normal.  The left ventricular systolic function is normal.    Patient Profile     52 y.o. male with a history of chest pain, DOE, knee pain, obesity, GERD, HTN, PXE, and remote stroke (felt to be related to PXE), who was admitted for surgical evaluation following NSTEM and cath.   Assessment & Plan    1. NSTEM: Peak troponin 9.17; cath with multi-vessel CAD needing CABG.  No recurrent angina; meds increased yesterday.  For CABG tomorrow.  2. HTN: improved  BP and HR now on Imdur 30, metoprolol 50 mg bid, lisinopril 20 mg  3. HLD: now on atorvastatin 8-0 mg.  4. Probable OSA: needs outpatient sleep study.  Signed, Lennette Bihari, MD, [facility] 05/28/2017, 12:39 PM

## 2017-05-28 NOTE — Progress Notes (Signed)
*  Preliminary Results*   Pre-op Cardiac Surgery  Carotid Findings:  Findings suggest 1-39% right internal carotid artery stenosis. 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  Upper Extremity Right Left  Brachial Pressures 176-triphasic 168-triphasic  Radial Waveforms Triphasic Triphasic  Ulnar Waveforms Triphasic Triphasic  Palmar Arch (Allen's Test) Signal decreases 50% with radial compression, is unaffected with ulnar compression  Signal decreases <50% with radial compression, decreases >50% with ulnar compression.      Lower  Extremity Right Left  Dorsalis Pedis Triphasic Triphasic  Posterior Tibial Triphasic Triphasic    Findings:   Pedal waveforms are within normal limits at rest.  05/28/2017 10:33 AM Gertie Fey, BS, RVT, RDCS, RDMS

## 2017-05-28 NOTE — Progress Notes (Signed)
ANTICOAGULATION CONSULT NOTE - Follow Up Consult  Pharmacy Consult for heparin Indication: CAD awaiting CABG  Labs: Recent Labs    05/25/17 1355  05/25/17 1507 05/25/17 1735  05/25/17 2207 05/26/17 0522 05/27/17 0446 05/27/17 1101 05/28/17 0231  HGB  --    < > 15.1  --   --   --  14.1 13.6  --  13.4  HCT  --    < > 44.3  --   --   --  40.7 41.1  --  41.4  PLT  --    < > 281  --   --   --  270 244  --  285  APTT  --   --  95*  --   --  76*  --   --   --   --   LABPROT  --   --  13.1  --   --   --   --   --   --   --   INR  --   --  1.00  --   --   --   --   --   --   --   HEPARINUNFRC  --   --   --   --    < > 0.43 0.22* 0.21* 0.26* 0.20*  CREATININE 1.10  --   --   --   --   --  0.99 1.04  --   --   TROPONINI 0.04*  --   --  3.80*  --  9.17* 8.21*  --   --   --    < > = values in this interval not displayed.    Assessment: 51yo male subtherapeutic on heparin with lower heparin level despite rate increase yesterday; RN reports no gtt issues overnight or signs of bleeding.  Goal of Therapy:  Heparin level 0.3-0.7 units/ml   Plan:  Will increase heparin gtt by 2-3 units/kg/hr to 2300 units/hr and check level in 6 hours.    Vernard Gambles, PharmD, BCPS  05/28/2017,4:48 AM

## 2017-05-29 ENCOUNTER — Inpatient Hospital Stay (HOSPITAL_COMMUNITY)
Admission: AD | Disposition: A | Discharge status: Home Health | Payer: Self-pay | Source: Other Acute Inpatient Hospital | Attending: Thoracic Surgery (Cardiothoracic Vascular Surgery)

## 2017-05-29 ENCOUNTER — Inpatient Hospital Stay (HOSPITAL_COMMUNITY): Payer: Self-pay

## 2017-05-29 ENCOUNTER — Inpatient Hospital Stay (HOSPITAL_COMMUNITY): Payer: Self-pay | Admitting: Anesthesiology

## 2017-05-29 DIAGNOSIS — Z951 Presence of aortocoronary bypass graft: Secondary | ICD-10-CM

## 2017-05-29 HISTORY — PX: CORONARY ARTERY BYPASS GRAFT: SHX141

## 2017-05-29 HISTORY — PX: TEE WITHOUT CARDIOVERSION: SHX5443

## 2017-05-29 LAB — POCT I-STAT 4, (NA,K, GLUC, HGB,HCT)
Glucose, Bld: 159 mg/dL — ABNORMAL HIGH (ref 65–99)
HCT: 41 % (ref 39.0–52.0)
HEMOGLOBIN: 13.9 g/dL (ref 13.0–17.0)
POTASSIUM: 4.9 mmol/L (ref 3.5–5.1)
SODIUM: 139 mmol/L (ref 135–145)

## 2017-05-29 LAB — POCT I-STAT 3, ART BLOOD GAS (G3+)
ACID-BASE DEFICIT: 2 mmol/L (ref 0.0–2.0)
ACID-BASE DEFICIT: 8 mmol/L — AB (ref 0.0–2.0)
ACID-BASE DEFICIT: 5 mmol/L — AB (ref 0.0–2.0)
Acid-base deficit: 7 mmol/L — ABNORMAL HIGH (ref 0.0–2.0)
Acid-base deficit: 4 mmol/L — ABNORMAL HIGH (ref 0.0–2.0)
Acid-base deficit: 4 mmol/L — ABNORMAL HIGH (ref 0.0–2.0)
Acid-base deficit: 1 mmol/L (ref 0.0–2.0)
Acid-base deficit: 4 mmol/L — ABNORMAL HIGH (ref 0.0–2.0)
BICARBONATE: 18.1 mmol/L — AB (ref 20.0–28.0)
BICARBONATE: 20.8 mmol/L (ref 20.0–28.0)
BICARBONATE: 21.9 mmol/L (ref 20.0–28.0)
BICARBONATE: 23.7 mmol/L (ref 20.0–28.0)
BICARBONATE: 21.4 mmol/L (ref 20.0–28.0)
Bicarbonate: 24.6 mmol/L (ref 20.0–28.0)
Bicarbonate: 19.2 mmol/L — ABNORMAL LOW (ref 20.0–28.0)
Bicarbonate: 21.9 mmol/L (ref 20.0–28.0)
O2 SAT: 97 %
O2 SAT: 99 %
O2 SAT: 98 %
O2 Saturation: 100 %
O2 Saturation: 91 %
O2 Saturation: 97 %
O2 Saturation: 98 %
O2 Saturation: 99 %
PCO2 ART: 38.6 mmHg (ref 32.0–48.0)
PCO2 ART: 41.7 mmHg (ref 32.0–48.0)
PCO2 ART: 39.5 mmHg (ref 32.0–48.0)
PCO2 ART: 41.8 mmHg (ref 32.0–48.0)
PH ART: 7.311 — AB (ref 7.350–7.450)
PH ART: 7.306 — AB (ref 7.350–7.450)
PH ART: 7.391 (ref 7.350–7.450)
PO2 ART: 67 mmHg — AB (ref 83.0–108.0)
PO2 ART: 140 mmHg — AB (ref 83.0–108.0)
PO2 ART: 117 mmHg — AB (ref 83.0–108.0)
Patient temperature: 37.7
Patient temperature: 38
Patient temperature: 38.2
TCO2: 26 mmol/L (ref 22–32)
TCO2: 20 mmol/L — ABNORMAL LOW (ref 22–32)
TCO2: 19 mmol/L — AB (ref 22–32)
TCO2: 22 mmol/L (ref 22–32)
TCO2: 23 mmol/L (ref 22–32)
TCO2: 23 mmol/L (ref 22–32)
TCO2: 25 mmol/L (ref 22–32)
TCO2: 23 mmol/L (ref 22–32)
pCO2 arterial: 48.8 mmHg — ABNORMAL HIGH (ref 32.0–48.0)
pCO2 arterial: 37.7 mmHg (ref 32.0–48.0)
pCO2 arterial: 38.6 mmHg (ref 32.0–48.0)
pCO2 arterial: 41.2 mmHg (ref 32.0–48.0)
pH, Arterial: 7.285 — ABNORMAL LOW (ref 7.350–7.450)
pH, Arterial: 7.336 — ABNORMAL LOW (ref 7.350–7.450)
pH, Arterial: 7.336 — ABNORMAL LOW (ref 7.350–7.450)
pH, Arterial: 7.332 — ABNORMAL LOW (ref 7.350–7.450)
pH, Arterial: 7.322 — ABNORMAL LOW (ref 7.350–7.450)
pO2, Arterial: 415 mmHg — ABNORMAL HIGH (ref 83.0–108.0)
pO2, Arterial: 95 mmHg (ref 83.0–108.0)
pO2, Arterial: 116 mmHg — ABNORMAL HIGH (ref 83.0–108.0)
pO2, Arterial: 97 mmHg (ref 83.0–108.0)
pO2, Arterial: 127 mmHg — ABNORMAL HIGH (ref 83.0–108.0)

## 2017-05-29 LAB — CBC
HCT: 43.8 % (ref 39.0–52.0)
HCT: 40.2 % (ref 39.0–52.0)
HEMATOCRIT: 41 % (ref 39.0–52.0)
HEMOGLOBIN: 13.7 g/dL (ref 13.0–17.0)
HEMOGLOBIN: 13.4 g/dL (ref 13.0–17.0)
Hemoglobin: 14.8 g/dL (ref 13.0–17.0)
MCH: 29.6 pg (ref 26.0–34.0)
MCH: 29.7 pg (ref 26.0–34.0)
MCH: 29.2 pg (ref 26.0–34.0)
MCHC: 33.4 g/dL (ref 30.0–36.0)
MCHC: 33.8 g/dL (ref 30.0–36.0)
MCHC: 33.3 g/dL (ref 30.0–36.0)
MCV: 88.6 fL (ref 78.0–100.0)
MCV: 87.8 fL (ref 78.0–100.0)
MCV: 87.6 fL (ref 78.0–100.0)
PLATELETS: 208 10*3/uL (ref 150–400)
Platelets: 251 10*3/uL (ref 150–400)
Platelets: 174 10*3/uL (ref 150–400)
RBC: 4.63 MIL/uL (ref 4.22–5.81)
RBC: 4.99 MIL/uL (ref 4.22–5.81)
RBC: 4.59 MIL/uL (ref 4.22–5.81)
RDW: 13.5 % (ref 11.5–15.5)
RDW: 13.4 % (ref 11.5–15.5)
RDW: 13.3 % (ref 11.5–15.5)
WBC: 9 10*3/uL (ref 4.0–10.5)
WBC: 16.2 10*3/uL — AB (ref 4.0–10.5)
WBC: 10.4 10*3/uL (ref 4.0–10.5)

## 2017-05-29 LAB — POCT I-STAT, CHEM 8
BUN: 13 mg/dL (ref 6–20)
BUN: 13 mg/dL (ref 6–20)
BUN: 13 mg/dL (ref 6–20)
BUN: 13 mg/dL (ref 6–20)
BUN: 13 mg/dL (ref 6–20)
BUN: 13 mg/dL (ref 6–20)
BUN: 13 mg/dL (ref 6–20)
BUN: 11 mg/dL (ref 6–20)
CALCIUM ION: 1.02 mmol/L — AB (ref 1.15–1.40)
CALCIUM ION: 1.06 mmol/L — AB (ref 1.15–1.40)
CALCIUM ION: 1.06 mmol/L — AB (ref 1.15–1.40)
CALCIUM ION: 1.06 mmol/L — AB (ref 1.15–1.40)
CHLORIDE: 105 mmol/L (ref 101–111)
CHLORIDE: 105 mmol/L (ref 101–111)
CHLORIDE: 106 mmol/L (ref 101–111)
CHLORIDE: 107 mmol/L (ref 101–111)
CREATININE: 0.9 mg/dL (ref 0.61–1.24)
CREATININE: 0.9 mg/dL (ref 0.61–1.24)
CREATININE: 0.9 mg/dL (ref 0.61–1.24)
Calcium, Ion: 1.17 mmol/L (ref 1.15–1.40)
Calcium, Ion: 1.17 mmol/L (ref 1.15–1.40)
Calcium, Ion: 1.13 mmol/L — ABNORMAL LOW (ref 1.15–1.40)
Calcium, Ion: 1.12 mmol/L — ABNORMAL LOW (ref 1.15–1.40)
Chloride: 104 mmol/L (ref 101–111)
Chloride: 105 mmol/L (ref 101–111)
Chloride: 104 mmol/L (ref 101–111)
Chloride: 104 mmol/L (ref 101–111)
Creatinine, Ser: 0.7 mg/dL (ref 0.61–1.24)
Creatinine, Ser: 0.9 mg/dL (ref 0.61–1.24)
Creatinine, Ser: 0.9 mg/dL (ref 0.61–1.24)
Creatinine, Ser: 1 mg/dL (ref 0.61–1.24)
Creatinine, Ser: 0.8 mg/dL (ref 0.61–1.24)
GLUCOSE: 109 mg/dL — AB (ref 65–99)
GLUCOSE: 101 mg/dL — AB (ref 65–99)
GLUCOSE: 104 mg/dL — AB (ref 65–99)
GLUCOSE: 145 mg/dL — AB (ref 65–99)
GLUCOSE: 141 mg/dL — AB (ref 65–99)
Glucose, Bld: 113 mg/dL — ABNORMAL HIGH (ref 65–99)
Glucose, Bld: 162 mg/dL — ABNORMAL HIGH (ref 65–99)
Glucose, Bld: 172 mg/dL — ABNORMAL HIGH (ref 65–99)
HCT: 39 % (ref 39.0–52.0)
HCT: 37 % — ABNORMAL LOW (ref 39.0–52.0)
HCT: 33 % — ABNORMAL LOW (ref 39.0–52.0)
HCT: 38 % — ABNORMAL LOW (ref 39.0–52.0)
HCT: 32 % — ABNORMAL LOW (ref 39.0–52.0)
HCT: 37 % — ABNORMAL LOW (ref 39.0–52.0)
HEMATOCRIT: 26 % — AB (ref 39.0–52.0)
HEMATOCRIT: 33 % — AB (ref 39.0–52.0)
HEMOGLOBIN: 12.6 g/dL — AB (ref 13.0–17.0)
HEMOGLOBIN: 11.2 g/dL — AB (ref 13.0–17.0)
HEMOGLOBIN: 10.9 g/dL — AB (ref 13.0–17.0)
HEMOGLOBIN: 8.8 g/dL — AB (ref 13.0–17.0)
Hemoglobin: 13.3 g/dL (ref 13.0–17.0)
Hemoglobin: 12.9 g/dL — ABNORMAL LOW (ref 13.0–17.0)
Hemoglobin: 11.2 g/dL — ABNORMAL LOW (ref 13.0–17.0)
Hemoglobin: 12.6 g/dL — ABNORMAL LOW (ref 13.0–17.0)
POTASSIUM: 4.2 mmol/L (ref 3.5–5.1)
POTASSIUM: 4.3 mmol/L (ref 3.5–5.1)
POTASSIUM: 4.7 mmol/L (ref 3.5–5.1)
Potassium: 4.1 mmol/L (ref 3.5–5.1)
Potassium: 4.3 mmol/L (ref 3.5–5.1)
Potassium: 5.5 mmol/L — ABNORMAL HIGH (ref 3.5–5.1)
Potassium: 6.5 mmol/L (ref 3.5–5.1)
Potassium: 5.6 mmol/L — ABNORMAL HIGH (ref 3.5–5.1)
SODIUM: 132 mmol/L — AB (ref 135–145)
SODIUM: 136 mmol/L (ref 135–145)
SODIUM: 136 mmol/L (ref 135–145)
Sodium: 139 mmol/L (ref 135–145)
Sodium: 138 mmol/L (ref 135–145)
Sodium: 138 mmol/L (ref 135–145)
Sodium: 139 mmol/L (ref 135–145)
Sodium: 140 mmol/L (ref 135–145)
TCO2: 25 mmol/L (ref 22–32)
TCO2: 24 mmol/L (ref 22–32)
TCO2: 24 mmol/L (ref 22–32)
TCO2: 24 mmol/L (ref 22–32)
TCO2: 23 mmol/L (ref 22–32)
TCO2: 24 mmol/L (ref 22–32)
TCO2: 20 mmol/L — ABNORMAL LOW (ref 22–32)
TCO2: 23 mmol/L (ref 22–32)

## 2017-05-29 LAB — CREATININE, SERUM
Creatinine, Ser: 0.99 mg/dL (ref 0.61–1.24)
GFR calc Af Amer: >60 mL/min (ref >60)
GFR calc non Af Amer: >60 mL/min (ref >60)

## 2017-05-29 LAB — PROTIME-INR
INR: 1.19
PROTHROMBIN TIME: 15 s (ref 11.4–15.2)

## 2017-05-29 LAB — GLUCOSE, CAPILLARY
GLUCOSE-CAPILLARY: 142 mg/dL — AB (ref 65–99)
GLUCOSE-CAPILLARY: 131 mg/dL — AB (ref 65–99)
GLUCOSE-CAPILLARY: 130 mg/dL — AB (ref 65–99)
Glucose-Capillary: 136 mg/dL — ABNORMAL HIGH (ref 65–99)
Glucose-Capillary: 141 mg/dL — ABNORMAL HIGH (ref 65–99)
Glucose-Capillary: 167 mg/dL — ABNORMAL HIGH (ref 65–99)

## 2017-05-29 LAB — APTT: aPTT: 35 seconds (ref 24–36)

## 2017-05-29 LAB — ABO/RH: ABO/RH(D): O POS

## 2017-05-29 LAB — PREPARE RBC (CROSSMATCH)

## 2017-05-29 LAB — HEMOGLOBIN AND HEMATOCRIT, BLOOD
HCT: 28.1 % — ABNORMAL LOW (ref 39.0–52.0)
Hemoglobin: 9.4 g/dL — ABNORMAL LOW (ref 13.0–17.0)

## 2017-05-29 LAB — MAGNESIUM: Magnesium: 3.4 mg/dL — ABNORMAL HIGH (ref 1.7–2.4)

## 2017-05-29 LAB — HEPARIN LEVEL (UNFRACTIONATED): HEPARIN UNFRACTIONATED: 0.44 [IU]/mL (ref 0.30–0.70)

## 2017-05-29 LAB — PLATELET COUNT: Platelets: 188 10*3/uL (ref 150–400)

## 2017-05-29 SURGERY — CORONARY ARTERY BYPASS GRAFTING (CABG)
Anesthesia: General | Site: Chest

## 2017-05-29 MED ORDER — ASPIRIN EC 325 MG PO TBEC
325.0000 mg | DELAYED_RELEASE_TABLET | Freq: Every day | ORAL | Status: DC
  Administered 2017-05-30 – 2017-05-31 (×2): 325 mg via ORAL
  Filled 2017-05-29 (×2): qty 1

## 2017-05-29 MED ORDER — NITROGLYCERIN IN D5W 200-5 MCG/ML-% IV SOLN
0.0000 ug/min | INTRAVENOUS | Status: DC
  Administered 2017-05-29: 10 ug/min via INTRAVENOUS

## 2017-05-29 MED ORDER — METOPROLOL TARTRATE 5 MG/5ML IV SOLN: 2.5000 mg | INTRAVENOUS | Status: DC | PRN

## 2017-05-29 MED ORDER — METOPROLOL TARTRATE 12.5 MG HALF TABLET: 12.5000 mg | ORAL_TABLET | Freq: Once | ORAL | Status: DC

## 2017-05-29 MED ORDER — DOCUSATE SODIUM 100 MG PO CAPS
200.0000 mg | ORAL_CAPSULE | Freq: Every day | ORAL | Status: DC
  Administered 2017-05-30 – 2017-05-31 (×2): 200 mg via ORAL
  Filled 2017-05-29 (×2): qty 2

## 2017-05-29 MED ORDER — CHLORHEXIDINE GLUCONATE CLOTH 2 % EX PADS
6.0000 | MEDICATED_PAD | Freq: Every day | CUTANEOUS | Status: DC
  Administered 2017-05-29 – 2017-05-31 (×3): 6 via TOPICAL

## 2017-05-29 MED ORDER — PROPOFOL 10 MG/ML IV BOLUS
INTRAVENOUS | Status: AC
  Filled 2017-05-29: qty 40

## 2017-05-29 MED ORDER — AMIODARONE HCL IN DEXTROSE 360-4.14 MG/200ML-% IV SOLN
30.0000 mg/h | INTRAVENOUS | Status: DC
  Administered 2017-05-29 – 2017-05-31 (×3): 30 mg/h via INTRAVENOUS
  Filled 2017-05-29 (×3): qty 200

## 2017-05-29 MED ORDER — ALBUMIN HUMAN 5 % IV SOLN
250.0000 mL | INTRAVENOUS | Status: AC | PRN
  Administered 2017-05-29 (×3): 250 mL via INTRAVENOUS
  Filled 2017-05-29: qty 250

## 2017-05-29 MED ORDER — SODIUM CHLORIDE 0.9% FLUSH
3.0000 mL | Freq: Two times a day (BID) | INTRAVENOUS | Status: DC
  Administered 2017-05-30 – 2017-05-31 (×4): 3 mL via INTRAVENOUS

## 2017-05-29 MED ORDER — LACTATED RINGERS IV SOLN: INTRAVENOUS | Status: DC

## 2017-05-29 MED ORDER — LIDOCAINE 2% (20 MG/ML) 5 ML SYRINGE
INTRAMUSCULAR | Status: AC
  Filled 2017-05-29: qty 5

## 2017-05-29 MED ORDER — METOPROLOL TARTRATE 25 MG/10 ML ORAL SUSPENSION: 12.5000 mg | Freq: Two times a day (BID) | ORAL | Status: DC

## 2017-05-29 MED ORDER — ACETAMINOPHEN 160 MG/5ML PO SOLN: 650.0000 mg | Freq: Once | ORAL | Status: AC

## 2017-05-29 MED ORDER — HEMOSTATIC AGENTS (NO CHARGE) OPTIME
TOPICAL | Status: DC | PRN
  Administered 2017-05-29: 1 via TOPICAL

## 2017-05-29 MED ORDER — ALBUTEROL SULFATE HFA 108 (90 BASE) MCG/ACT IN AERS
INHALATION_SPRAY | RESPIRATORY_TRACT | Status: DC | PRN
  Administered 2017-05-29: 2 via RESPIRATORY_TRACT

## 2017-05-29 MED ORDER — OXYCODONE HCL 5 MG PO TABS
5.0000 mg | ORAL_TABLET | ORAL | Status: DC | PRN
  Administered 2017-05-29 – 2017-05-30 (×2): 10 mg via ORAL
  Administered 2017-05-30: 5 mg via ORAL
  Administered 2017-05-30 – 2017-05-31 (×4): 10 mg via ORAL
  Filled 2017-05-29 (×8): qty 2

## 2017-05-29 MED ORDER — EPHEDRINE 5 MG/ML INJ
INTRAVENOUS | Status: AC
  Filled 2017-05-29: qty 10

## 2017-05-29 MED ORDER — SODIUM CHLORIDE 0.9% FLUSH
10.0000 mL | Freq: Two times a day (BID) | INTRAVENOUS | Status: DC
  Administered 2017-05-29 – 2017-05-31 (×5): 10 mL

## 2017-05-29 MED ORDER — 0.9 % SODIUM CHLORIDE (POUR BTL) OPTIME
TOPICAL | Status: DC | PRN
  Administered 2017-05-29: 5000 mL
  Administered 2017-05-29: 1000 mL

## 2017-05-29 MED ORDER — METOPROLOL TARTRATE 12.5 MG HALF TABLET
12.5000 mg | ORAL_TABLET | Freq: Two times a day (BID) | ORAL | Status: DC
  Administered 2017-05-30 – 2017-05-31 (×2): 12.5 mg via ORAL
  Filled 2017-05-29 (×3): qty 1

## 2017-05-29 MED ORDER — LEVOFLOXACIN IN D5W 750 MG/150ML IV SOLN
750.0000 mg | INTRAVENOUS | Status: AC
  Administered 2017-05-30: 750 mg via INTRAVENOUS
  Filled 2017-05-29: qty 150

## 2017-05-29 MED ORDER — MIDAZOLAM HCL 2 MG/2ML IJ SOLN: 2.0000 mg | INTRAMUSCULAR | Status: DC | PRN

## 2017-05-29 MED ORDER — SODIUM BICARBONATE 8.4 % IV SOLN
50.0000 meq | Freq: Once | INTRAVENOUS | Status: AC
  Administered 2017-05-29: 50 meq via INTRAVENOUS

## 2017-05-29 MED ORDER — BISACODYL 5 MG PO TBEC
10.0000 mg | DELAYED_RELEASE_TABLET | Freq: Every day | ORAL | Status: DC
  Administered 2017-05-30 – 2017-05-31 (×2): 10 mg via ORAL
  Filled 2017-05-29 (×2): qty 2

## 2017-05-29 MED ORDER — MORPHINE SULFATE (PF) 2 MG/ML IV SOLN
2.0000 mg | INTRAVENOUS | Status: DC | PRN
  Administered 2017-05-30 (×2): 2 mg via INTRAVENOUS
  Filled 2017-05-29 (×2): qty 2

## 2017-05-29 MED ORDER — SUCCINYLCHOLINE CHLORIDE 20 MG/ML IJ SOLN
INTRAMUSCULAR | Status: DC | PRN
  Administered 2017-05-29: 140 mg via INTRAVENOUS

## 2017-05-29 MED ORDER — PROTAMINE SULFATE 10 MG/ML IV SOLN
INTRAVENOUS | Status: DC | PRN
  Administered 2017-05-29: 50 mg via INTRAVENOUS
  Administered 2017-05-29: 40 mg via INTRAVENOUS
  Administered 2017-05-29 (×3): 50 mg via INTRAVENOUS
  Administered 2017-05-29: 10 mg via INTRAVENOUS
  Administered 2017-05-29 (×3): 50 mg via INTRAVENOUS

## 2017-05-29 MED ORDER — FENTANYL CITRATE (PF) 250 MCG/5ML IJ SOLN
INTRAMUSCULAR | Status: AC
  Filled 2017-05-29: qty 5

## 2017-05-29 MED ORDER — CHLORHEXIDINE GLUCONATE 0.12 % MT SOLN: 15.0000 mL | Freq: Once | OROMUCOSAL | Status: DC

## 2017-05-29 MED ORDER — HEPARIN SODIUM (PORCINE) 1000 UNIT/ML IJ SOLN
INTRAMUSCULAR | Status: DC | PRN
  Administered 2017-05-29: 48000 [IU] via INTRAVENOUS

## 2017-05-29 MED ORDER — SODIUM CHLORIDE 0.9 % IV SOLN
INTRAVENOUS | Status: DC
  Filled 2017-05-29 (×2): qty 1

## 2017-05-29 MED ORDER — VANCOMYCIN HCL IN DEXTROSE 1-5 GM/200ML-% IV SOLN
1000.0000 mg | Freq: Once | INTRAVENOUS | Status: AC
  Administered 2017-05-29: 1000 mg via INTRAVENOUS
  Filled 2017-05-29: qty 200

## 2017-05-29 MED ORDER — MIDAZOLAM HCL 2 MG/2ML IJ SOLN
INTRAMUSCULAR | Status: DC | PRN
  Administered 2017-05-29 (×4): 2 mg via INTRAVENOUS

## 2017-05-29 MED ORDER — SODIUM CHLORIDE 0.9 % IV SOLN
0.0000 ug/min | INTRAVENOUS | Status: DC
  Filled 2017-05-29: qty 2

## 2017-05-29 MED ORDER — ACETAMINOPHEN 500 MG PO TABS
1000.0000 mg | ORAL_TABLET | Freq: Four times a day (QID) | ORAL | Status: DC
  Administered 2017-05-29 – 2017-06-01 (×10): 1000 mg via ORAL
  Filled 2017-05-29 (×10): qty 2

## 2017-05-29 MED ORDER — ROCURONIUM BROMIDE 10 MG/ML (PF) SYRINGE
PREFILLED_SYRINGE | INTRAVENOUS | Status: AC
  Filled 2017-05-29: qty 10

## 2017-05-29 MED ORDER — SODIUM CHLORIDE 0.9% FLUSH: 3.0000 mL | INTRAVENOUS | Status: DC | PRN

## 2017-05-29 MED ORDER — BISACODYL 5 MG PO TBEC
5.0000 mg | DELAYED_RELEASE_TABLET | Freq: Once | ORAL | Status: DC
  Filled 2017-05-29: qty 1

## 2017-05-29 MED ORDER — GELATIN ABSORBABLE MT POWD
OROMUCOSAL | Status: DC | PRN
  Administered 2017-05-29 (×4): 4 mL via TOPICAL

## 2017-05-29 MED ORDER — DEXMEDETOMIDINE HCL IN NACL 200 MCG/50ML IV SOLN
INTRAVENOUS | Status: AC
  Filled 2017-05-29: qty 50

## 2017-05-29 MED ORDER — HEPARIN SODIUM (PORCINE) 1000 UNIT/ML IJ SOLN
INTRAMUSCULAR | Status: AC
  Filled 2017-05-29: qty 2

## 2017-05-29 MED ORDER — INSULIN REGULAR BOLUS VIA INFUSION
0.0000 [IU] | Freq: Three times a day (TID) | INTRAVENOUS | Status: DC
  Filled 2017-05-29: qty 10

## 2017-05-29 MED ORDER — MIDAZOLAM HCL 2 MG/2ML IJ SOLN
INTRAMUSCULAR | Status: AC
  Filled 2017-05-29: qty 4

## 2017-05-29 MED ORDER — MORPHINE SULFATE (PF) 2 MG/ML IV SOLN: 1.0000 mg | INTRAVENOUS | Status: AC | PRN

## 2017-05-29 MED ORDER — LACTATED RINGERS IV SOLN
INTRAVENOUS | Status: DC
  Administered 2017-05-29: 17:00:00 via INTRAVENOUS

## 2017-05-29 MED ORDER — PHENYLEPHRINE HCL 10 MG/ML IJ SOLN
INTRAVENOUS | Status: DC | PRN
  Administered 2017-05-29: 25 ug/min via INTRAVENOUS

## 2017-05-29 MED ORDER — PLASMA-LYTE 148 IV SOLN
INTRAVENOUS | Status: DC | PRN
  Administered 2017-05-29: 500 mL via INTRAVASCULAR

## 2017-05-29 MED ORDER — MAGNESIUM SULFATE 4 GM/100ML IV SOLN
4.0000 g | Freq: Once | INTRAVENOUS | Status: AC
  Administered 2017-05-29: 4 g via INTRAVENOUS
  Filled 2017-05-29: qty 100

## 2017-05-29 MED ORDER — AMIODARONE LOAD VIA INFUSION
150.0000 mg | Freq: Once | INTRAVENOUS | Status: AC
  Administered 2017-05-29: 150 mg via INTRAVENOUS
  Filled 2017-05-29: qty 83.34

## 2017-05-29 MED ORDER — CHLORHEXIDINE GLUCONATE 0.12 % MT SOLN
15.0000 mL | OROMUCOSAL | Status: AC
  Administered 2017-05-29: 15 mL via OROMUCOSAL

## 2017-05-29 MED ORDER — ACETAMINOPHEN 650 MG RE SUPP
650.0000 mg | Freq: Once | RECTAL | Status: AC
  Administered 2017-05-29: 650 mg via RECTAL

## 2017-05-29 MED ORDER — LACTATED RINGERS IV SOLN
INTRAVENOUS | Status: DC | PRN
  Administered 2017-05-29 (×3): via INTRAVENOUS

## 2017-05-29 MED ORDER — TRAMADOL HCL 50 MG PO TABS: 50.0000 mg | ORAL_TABLET | ORAL | Status: DC | PRN

## 2017-05-29 MED ORDER — POTASSIUM CHLORIDE 10 MEQ/50ML IV SOLN: 10.0000 meq | INTRAVENOUS | Status: AC

## 2017-05-29 MED ORDER — BISACODYL 10 MG RE SUPP: 10.0000 mg | Freq: Every day | RECTAL | Status: DC

## 2017-05-29 MED ORDER — FAMOTIDINE IN NACL 20-0.9 MG/50ML-% IV SOLN
20.0000 mg | Freq: Two times a day (BID) | INTRAVENOUS | Status: AC
  Administered 2017-05-29: 20 mg via INTRAVENOUS
  Filled 2017-05-29: qty 50

## 2017-05-29 MED ORDER — FENTANYL CITRATE (PF) 250 MCG/5ML IJ SOLN
INTRAMUSCULAR | Status: AC
  Filled 2017-05-29: qty 15

## 2017-05-29 MED ORDER — ARTIFICIAL TEARS OPHTHALMIC OINT
TOPICAL_OINTMENT | OPHTHALMIC | Status: AC
  Filled 2017-05-29: qty 3.5

## 2017-05-29 MED ORDER — ASPIRIN 81 MG PO CHEW: 324.0000 mg | CHEWABLE_TABLET | Freq: Every day | ORAL | Status: DC

## 2017-05-29 MED ORDER — FENTANYL CITRATE (PF) 250 MCG/5ML IJ SOLN
INTRAMUSCULAR | Status: AC
  Filled 2017-05-29: qty 10

## 2017-05-29 MED ORDER — CHLORHEXIDINE GLUCONATE CLOTH 2 % EX PADS: 6.0000 | MEDICATED_PAD | Freq: Once | CUTANEOUS | Status: DC

## 2017-05-29 MED ORDER — PROPOFOL 10 MG/ML IV BOLUS
INTRAVENOUS | Status: DC | PRN
  Administered 2017-05-29: 150 mg via INTRAVENOUS

## 2017-05-29 MED ORDER — SODIUM CHLORIDE 0.9 % IV SOLN
INTRAVENOUS | Status: DC
  Administered 2017-05-29: 16:00:00 via INTRAVENOUS

## 2017-05-29 MED ORDER — SODIUM CHLORIDE 0.9 % IV SOLN: 250.0000 mL | INTRAVENOUS | Status: DC

## 2017-05-29 MED ORDER — SODIUM CHLORIDE 0.9% FLUSH: 10.0000 mL | INTRAVENOUS | Status: DC | PRN

## 2017-05-29 MED ORDER — TEMAZEPAM 15 MG PO CAPS: 15.0000 mg | ORAL_CAPSULE | Freq: Once | ORAL | Status: DC | PRN

## 2017-05-29 MED ORDER — HEPARIN SODIUM (PORCINE) 1000 UNIT/ML IJ SOLN
INTRAMUSCULAR | Status: AC
  Filled 2017-05-29: qty 1

## 2017-05-29 MED ORDER — PANTOPRAZOLE SODIUM 40 MG PO TBEC
40.0000 mg | DELAYED_RELEASE_TABLET | Freq: Every day | ORAL | Status: DC
  Administered 2017-05-31: 40 mg via ORAL
  Filled 2017-05-29: qty 1

## 2017-05-29 MED ORDER — AMIODARONE HCL IN DEXTROSE 360-4.14 MG/200ML-% IV SOLN
60.0000 mg/h | INTRAVENOUS | Status: AC
  Administered 2017-05-29 (×2): 60 mg/h via INTRAVENOUS
  Filled 2017-05-29 (×2): qty 200

## 2017-05-29 MED ORDER — DEXMEDETOMIDINE HCL IN NACL 200 MCG/50ML IV SOLN
0.0000 ug/kg/h | INTRAVENOUS | Status: DC
  Administered 2017-05-29 (×2): 0.4 ug/kg/h via INTRAVENOUS
  Filled 2017-05-29 (×2): qty 50

## 2017-05-29 MED ORDER — CHLORHEXIDINE GLUCONATE 0.12 % MT SOLN
15.0000 mL | Freq: Two times a day (BID) | OROMUCOSAL | Status: DC
  Administered 2017-05-29: 15 mL via OROMUCOSAL

## 2017-05-29 MED ORDER — ONDANSETRON HCL 4 MG/2ML IJ SOLN
4.0000 mg | Freq: Four times a day (QID) | INTRAMUSCULAR | Status: DC | PRN
  Administered 2017-05-31: 4 mg via INTRAVENOUS
  Filled 2017-05-29: qty 2

## 2017-05-29 MED ORDER — GLYCOPYRROLATE 0.2 MG/ML IJ SOLN
INTRAMUSCULAR | Status: DC | PRN
  Administered 2017-05-29: 0.2 mg via INTRAVENOUS

## 2017-05-29 MED ORDER — ORAL CARE MOUTH RINSE: 15.0000 mL | Freq: Two times a day (BID) | OROMUCOSAL | Status: DC

## 2017-05-29 MED ORDER — LACTATED RINGERS IV SOLN: 500.0000 mL | Freq: Once | INTRAVENOUS | Status: DC | PRN

## 2017-05-29 MED ORDER — FENTANYL CITRATE (PF) 250 MCG/5ML IJ SOLN
INTRAMUSCULAR | Status: DC | PRN
  Administered 2017-05-29: 100 ug via INTRAVENOUS
  Administered 2017-05-29: 250 ug via INTRAVENOUS
  Administered 2017-05-29: 150 ug via INTRAVENOUS
  Administered 2017-05-29 (×3): 250 ug via INTRAVENOUS
  Administered 2017-05-29: 100 ug via INTRAVENOUS
  Administered 2017-05-29 (×2): 250 ug via INTRAVENOUS

## 2017-05-29 MED ORDER — ACETAMINOPHEN 160 MG/5ML PO SOLN: 1000.0000 mg | Freq: Four times a day (QID) | ORAL | Status: DC

## 2017-05-29 MED ORDER — TRANEXAMIC ACID 1000 MG/10ML IV SOLN
1.5000 mg/kg/h | INTRAVENOUS | Status: DC
  Filled 2017-05-29: qty 25

## 2017-05-29 MED ORDER — ROCURONIUM BROMIDE 100 MG/10ML IV SOLN
INTRAVENOUS | Status: DC | PRN
  Administered 2017-05-29: 100 mg via INTRAVENOUS
  Administered 2017-05-29 (×2): 50 mg via INTRAVENOUS
  Administered 2017-05-29: 100 mg via INTRAVENOUS

## 2017-05-29 MED ORDER — SUCCINYLCHOLINE CHLORIDE 200 MG/10ML IV SOSY
PREFILLED_SYRINGE | INTRAVENOUS | Status: AC
  Filled 2017-05-29: qty 10

## 2017-05-29 MED ORDER — SODIUM CHLORIDE 0.45 % IV SOLN
INTRAVENOUS | Status: DC | PRN
  Administered 2017-05-29: 15:00:00 via INTRAVENOUS

## 2017-05-29 SURGICAL SUPPLY — 95 items
BAG DECANTER FOR FLEXI CONT (MISCELLANEOUS) ×4 IMPLANT
BANDAGE ACE 4X5 VEL STRL LF (GAUZE/BANDAGES/DRESSINGS) ×6 IMPLANT
BANDAGE ACE 6X5 VEL STRL LF (GAUZE/BANDAGES/DRESSINGS) ×6 IMPLANT
BASKET HEART  (ORDER IN 25'S) (MISCELLANEOUS) ×1
BASKET HEART (ORDER IN 25'S) (MISCELLANEOUS) ×1
BASKET HEART (ORDER IN 25S) (MISCELLANEOUS) ×2 IMPLANT
BLADE STERNUM SYSTEM 6 (BLADE) ×4 IMPLANT
BNDG GAUZE ELAST 4 BULKY (GAUZE/BANDAGES/DRESSINGS) ×6 IMPLANT
CANISTER SUCT 3000ML PPV (MISCELLANEOUS) ×4 IMPLANT
CANNULA EZ GLIDE AORTIC 21FR (CANNULA) ×4 IMPLANT
CATH CPB KIT HENDRICKSON (MISCELLANEOUS) ×4 IMPLANT
CATH ROBINSON RED A/P 18FR (CATHETERS) ×6 IMPLANT
CATH THORACIC 36FR (CATHETERS) ×4 IMPLANT
CATH THORACIC 36FR RT ANG (CATHETERS) ×4 IMPLANT
CLIP FOGARTY SPRING 6M (CLIP) ×2 IMPLANT
CLIP VESOCCLUDE MED 24/CT (CLIP) IMPLANT
CLIP VESOCCLUDE SM WIDE 24/CT (CLIP) ×8 IMPLANT
CRADLE DONUT ADULT HEAD (MISCELLANEOUS) ×4 IMPLANT
DRAPE CARDIOVASCULAR INCISE (DRAPES) ×2
DRAPE SLUSH/WARMER DISC (DRAPES) ×4 IMPLANT
DRAPE SRG 135X102X78XABS (DRAPES) ×2 IMPLANT
DRSG AQUACEL AG ADV 3.5X14 (GAUZE/BANDAGES/DRESSINGS) ×2 IMPLANT
DRSG COVADERM 4X14 (GAUZE/BANDAGES/DRESSINGS) ×4 IMPLANT
ELECT REM PT RETURN 9FT ADLT (ELECTROSURGICAL) ×8
ELECTRODE REM PT RTRN 9FT ADLT (ELECTROSURGICAL) ×4 IMPLANT
FELT TEFLON 1X6 (MISCELLANEOUS) ×6 IMPLANT
GAUZE SPONGE 4X4 12PLY STRL (GAUZE/BANDAGES/DRESSINGS) ×10 IMPLANT
GLOVE BIO SURGEON STRL SZ 6.5 (GLOVE) ×6 IMPLANT
GLOVE BIO SURGEON STRL SZ7.5 (GLOVE) ×4 IMPLANT
GLOVE BIO SURGEONS STRL SZ 6.5 (GLOVE) ×6
GLOVE BIOGEL PI IND STRL 6.5 (GLOVE) IMPLANT
GLOVE BIOGEL PI INDICATOR 6.5 (GLOVE) ×8
GLOVE SURG SIGNA 7.5 PF LTX (GLOVE) ×12 IMPLANT
GOWN STRL REUS W/ TWL LRG LVL3 (GOWN DISPOSABLE) ×8 IMPLANT
GOWN STRL REUS W/ TWL XL LVL3 (GOWN DISPOSABLE) ×4 IMPLANT
GOWN STRL REUS W/TWL LRG LVL3 (GOWN DISPOSABLE) ×14
GOWN STRL REUS W/TWL XL LVL3 (GOWN DISPOSABLE) ×4
HEMOSTAT POWDER SURGIFOAM 1G (HEMOSTASIS) ×14 IMPLANT
HEMOSTAT SURGICEL 2X14 (HEMOSTASIS) ×4 IMPLANT
INSERT FOGARTY XLG (MISCELLANEOUS) IMPLANT
KIT BASIN OR (CUSTOM PROCEDURE TRAY) ×4 IMPLANT
KIT SUCTION CATH 14FR (SUCTIONS) ×8 IMPLANT
KIT TURNOVER KIT B (KITS) ×4 IMPLANT
KIT VASOVIEW HEMOPRO VH 3000 (KITS) ×4 IMPLANT
LOOP VESSEL MAXI BLUE (MISCELLANEOUS) ×2 IMPLANT
MARKER GRAFT CORONARY BYPASS (MISCELLANEOUS) ×12 IMPLANT
NS IRRIG 1000ML POUR BTL (IV SOLUTION) ×22 IMPLANT
PACK E OPEN HEART (SUTURE) ×4 IMPLANT
PACK OPEN HEART (CUSTOM PROCEDURE TRAY) ×4 IMPLANT
PAD ARMBOARD 7.5X6 YLW CONV (MISCELLANEOUS) ×8 IMPLANT
PAD ELECT DEFIB RADIOL ZOLL (MISCELLANEOUS) ×4 IMPLANT
PENCIL BUTTON HOLSTER BLD 10FT (ELECTRODE) ×4 IMPLANT
PUNCH AORTIC ROT 4.0MM RCL 40 (MISCELLANEOUS) ×2 IMPLANT
PUNCH AORTIC ROTATE 4.0MM (MISCELLANEOUS) IMPLANT
PUNCH AORTIC ROTATE 4.5MM 8IN (MISCELLANEOUS) IMPLANT
PUNCH AORTIC ROTATE 5MM 8IN (MISCELLANEOUS) IMPLANT
SET CARDIOPLEGIA MPS 5001102 (MISCELLANEOUS) ×2 IMPLANT
SPONGE LAP 18X18 X RAY DECT (DISPOSABLE) ×4 IMPLANT
STAPLER VISISTAT 35W (STAPLE) ×6 IMPLANT
SUT BONE WAX W31G (SUTURE) ×4 IMPLANT
SUT ETHIBOND 2 0 SH (SUTURE) ×2
SUT ETHIBOND 2 0 SH 36X2 (SUTURE) IMPLANT
SUT MNCRL AB 4-0 PS2 18 (SUTURE) IMPLANT
SUT PROLENE 3 0 SH DA (SUTURE) ×4 IMPLANT
SUT PROLENE 4 0 RB 1 (SUTURE)
SUT PROLENE 4 0 SH DA (SUTURE) IMPLANT
SUT PROLENE 4-0 RB1 .5 CRCL 36 (SUTURE) IMPLANT
SUT PROLENE 6 0 C 1 30 (SUTURE) ×12 IMPLANT
SUT PROLENE 7 0 BV 1 (SUTURE) ×4 IMPLANT
SUT PROLENE 7 0 BV1 MDA (SUTURE) ×6 IMPLANT
SUT PROLENE 8 0 BV175 6 (SUTURE) ×2 IMPLANT
SUT STEEL 6MS V (SUTURE) ×4 IMPLANT
SUT STEEL STERNAL CCS#1 18IN (SUTURE) IMPLANT
SUT STEEL SZ 6 DBL 3X14 BALL (SUTURE) ×4 IMPLANT
SUT VIC AB 1 CTX 36 (SUTURE) ×4
SUT VIC AB 1 CTX36XBRD ANBCTR (SUTURE) ×4 IMPLANT
SUT VIC AB 2-0 CT1 27 (SUTURE) ×10
SUT VIC AB 2-0 CT1 TAPERPNT 27 (SUTURE) IMPLANT
SUT VIC AB 2-0 CTX 27 (SUTURE) ×8 IMPLANT
SUT VIC AB 3-0 SH 27 (SUTURE)
SUT VIC AB 3-0 SH 27X BRD (SUTURE) IMPLANT
SUT VIC AB 3-0 X1 27 (SUTURE) ×4 IMPLANT
SUT VICRYL 4-0 PS2 18IN ABS (SUTURE) IMPLANT
SYSTEM SAHARA CHEST DRAIN ATS (WOUND CARE) ×4 IMPLANT
TAPE CLOTH SURG 4X10 WHT LF (GAUZE/BANDAGES/DRESSINGS) ×6 IMPLANT
TAPE PAPER 2X10 WHT MICROPORE (GAUZE/BANDAGES/DRESSINGS) ×2 IMPLANT
TOWEL GREEN STERILE (TOWEL DISPOSABLE) ×4 IMPLANT
TOWEL GREEN STERILE FF (TOWEL DISPOSABLE) ×4 IMPLANT
TRAY FOLEY SILVER 16FR TEMP (SET/KITS/TRAYS/PACK) ×4 IMPLANT
TUBE FEEDING 8FR 16IN STR KANG (MISCELLANEOUS) ×4 IMPLANT
TUBE SUCT INTRACARD DLP 20F (MISCELLANEOUS) ×2 IMPLANT
TUBING INSUFFLATION (TUBING) ×4 IMPLANT
UNDERPAD 30X30 (UNDERPADS AND DIAPERS) ×4 IMPLANT
WATER STERILE IRR 1000ML POUR (IV SOLUTION) ×8 IMPLANT
YANKAUER SUCT BULB TIP NO VENT (SUCTIONS) ×4 IMPLANT

## 2017-05-29 NOTE — Progress Notes (Signed)
  Echocardiogram Echocardiogram Transesophageal has been performed.  Jon Garner 05/29/2017, 9:32 AM

## 2017-05-29 NOTE — Op Note (Signed)
NAME:  Jon Garner, Jon Garner NO.:  [id number]  MEDICAL RECORD NO.:  [id number]  LOCATION:  [id number]                        FACILITY:  [facility]  PHYSICIAN:  Salvatore Decent. Dorris Fetch, M.D.DATE OF BIRTH:  1966-01-13  DATE OF PROCEDURE:  05/29/2017 DATE OF DISCHARGE:                              OPERATIVE REPORT   PREOPERATIVE DIAGNOSIS:  Severe three-vessel disease, status post non-ST- elevation myocardial infarction.  POSTOPERATIVE DIAGNOSIS:  Severe three-vessel disease, status post non- ST-elevation myocardial infarction.  PROCEDURES:   Median sternotomy, extracorporeal circulation, Coronary artery bypass grafting x 4  Left internal mammary artery to left anterior descending,  Saphenous vein graft to first diagonal,  Saphenous vein graft to posterior descending,  Saphenous vein graft to obtuse marginal 2,  Open vein harvest both thighs.  SURGEON:  Salvatore Decent. Dorris Fetch, M.D.  ASSISTANT:  Rowe Clack, P.A.-C.  ANESTHESIA:  General.  FINDINGS:  Vein difficult to isolate.  Could not be harvested endoscopically.  Vein of good quality once identified.  Mammary good quality.  Morbid obesity. Cardiomegaly. Mild mitral regurgitation by TEE, preserved left ventricular systolic function.  CLINICAL NOTE:  Jon Garner is a morbidly obese 52 year old man with multiple cardiac risk factors.  He presented with unstable chest pain and ruled in for a non-ST elevation MI.  He was found to have severe 3- vessel coronary artery disease with preserved left ventricular function. He was advised to undergo coronary artery bypass grafting.  The indications, risks, benefits, and alternatives were discussed in detail with the patient.  He understood and accepted the risks and agreed to proceed.  OPERATIVE NOTE:  Jon Garner was brought to the preoperative holding area on May 29, 2017.  Anesthesia placed a Swan-Ganz catheter and arterial blood pressure monitoring line.  His pulmonary artery  pressures were elevated at 52/29.  He was taken to the operating room, anesthetized, and intubated.  Intravenous antibiotics were administered.  A Foley catheter was placed.  Transesophageal echocardiography was performed. It showed mild mitral regurgitation.  There was preserved left ventricular systolic function.  There was left ventricular hypertrophy. The chest, abdomen, and legs were prepped and draped in usual sterile fashion.  The conduits were harvested simultaneously.  A median sternotomy was performed.  There was a large fat layer anterior to the sternum.  The Rultract retractor was placed and the left mammary artery was dissected under direct vision.  Branches were doubly clipped and divided with scissors.  The mammary was a good quality, 2-mm vessel with excellent flow when divided distally.  Simultaneously, an incision was made in the medial aspect of the right leg just below the knee.  A vein was identified and attempt was made to harvest this vein endoscopically.  However, the vein was small with multiple branches and it was not suitable.  An incision was made at the same level on the left leg and a short segment of the vein appeared to be of better quality, but again the vein was not able to be harvested endoscopically.  The segment of vein from the left leg, just below the knee to mid thigh was harvested in open fashion.  An incision was made in the right groin  and the saphenous vein was dissected out.  The saphenofemoral junction was difficult to identify due to it being very deep in the groin, however, it was ultimately identified.  Both the greater saphenous as well as an accessory saphenous vein were identified.  These vessels ran roughly parallel and they were harvested with a bridged open technique.  Both were satisfactory for use as bypass grafts.  The leg incisions were closed.  A sternal retractor was placed.  The patient was fully heparinized.  The  pericardium was opened.  There was massive cardiomegaly.  The aorta was relatively small and only a short segment of the aorta was available for use due to the patient's body habitus.  After confirming adequate anticoagulation with ACT measurement, the aorta was cannulated via concentric 2-0 Ethibond pledgeted pursestring sutures.  A dual-stage venous cannula was placed via pursestring suture in the right atrial appendage.  Cardiopulmonary bypass was initiated.  Flows were maintained per protocol.  The coronary arteries were inspected and anastomotic sites were chosen.  The conduits were inspected and cut to length.  The LAD was intramyocardial.  It could not be identified at this point in time.  A foam pad was placed in the pericardium to insulate the heart.  A temperature probe was placed in myocardial septum and a cardioplegia cannula was placed in the ascending aorta.  The aorta was crossclamped.  The left ventricle was emptied via the aortic root vent.  Cardiac arrest then was achieved with a combination of cold antegrade blood cardioplegia and topical iced saline.  There was a rapid diastolic arrest.  The patient was noted to be in atrial flutter.  Septal cooling was relatively slow and 2 L of cardioplegia was administered.  There was septal cooling to 13 degree Celsius.  A reversed saphenous vein graft was placed end-to-side to the posterior descending branch of the right coronary artery.  The vein graft and target were both good quality.  The PDA accepted a 1.5-mm probe both proximally and distally at the completion of the anastomosis. Cardioplegia was administered.  There was good flow and good hemostasis.  A reversed saphenous vein graft then was placed end-to-side to the second obtuse marginal.  This was a large dominant lateral branch.  It was the only graftable target on the lateral wall.  It was a 1.5-mm, good quality vessel.  The vein was good quality.  An  end-to-side anastomosis was performed with a running 7-0 Prolene suture.  Again, a probe passed easily proximally and distally at the completion of the anastomosis.  Cardioplegia was administered and there was good flow and good hemostasis.  Next, a reversed saphenous vein graft was placed end-to-side to the first diagonal branch of the LAD.  This vessel was fair quality.  There was stenosis and plaque throughout the length of the vessel.  A 1-mm probe did pass distally.  The vein was smaller in caliber than the other 2 veins and was only fair quality.  It was anastomosed end-to-side with a running 7-0 Prolene suture.  A probe did pass easily at the completion of the anastomosis and there was good flow with cardioplegia administration.  There also was good hemostasis.  Additional cardioplegia was administered down the aortic root as well.  The pericardium was incised.  The distal end of the left internal mammary artery was beveled.  It was anastomosed end-to-side to the LAD. The LAD was grafted relatively high on the anterior wall.  It was a diffusely-diseased, poor-quality  target.  It did accept a 1-mm probe towards the apex.  It was a very small vessel.  More proximally, the LAD accepted a 1.5-mm probe.  The mammary was a good quality.  An end-to- side anastomosis was performed with a running 8-0 Prolene suture.  At the completion the mammary to LAD anastomosis, the bulldog clamp was briefly removed.  There was good hemostasis.  There was rapid septal rewarming.  The mammary pedicle was tacked to the epicardial surface of heart with 6-0 Prolene sutures.  Additional cardioplegia was administered.  The vein grafts to the OM2 and posterior descending were cut to length.  The cardioplegia cannula was removed from the ascending aorta.  The proximal vein graft anastomoses for these 2 veins were performed to 4.0 mm punch aortotomies with running 6-0 Prolene sutures.  At the completion of  the second proximal anastomosis, the patient was placed in Trendelenburg position. Lidocaine was administered.  The aortic root was de-aired.  The bulldog clamp was again removed from the left mammary artery.  The aortic crossclamp was removed.  The total crossclamp time was 84 minutes.  The patient initially fibrillated and he was defibrillated and then was in heart block.  Bulldog clamps were placed proximally and distally on the vein to OM2 and a longitudinal venotomy was made.  The proximal anastomosis for the diagonal vein was placed end-to-side to the OM vein with a running 7-0 Prolene suture.  The distal clamp was removed first to de-air the graft prior to removing the proximal clamp.  The patient required defibrillation once again.  He was again in atrial flutter and a synchronized cardioversion was performed with 10 joules, which then resulted in conversion to sinus rhythm, although he was bradycardic. All proximal and distal anastomoses were inspected for hemostasis. Epicardial pacing wires were placed on the right ventricle and right atrium.  When the patient had rewarmed to a core temperature of 37 degrees Celsius, he was weaned from cardiopulmonary bypass on the first attempt.  He was on a dopamine infusion at 3 mcg/kg/min and he was DDD paced at 90 beats per minute at the time of separation from bypass. Initially after weaning from bypass, his mitral regurgitation appeared slightly worse, although it returned to its baseline mild overtime.  The initial cardiac index was greater than 2 L/min/m2.  The patient remained hemodynamically stable throughout the post-bypass period.  A test dose of protamine was administered and it was well tolerated. The atrial and aortic cannulae were removed.  The remainder of the protamine was administered without incident.  The chest was irrigated with warm saline.  Hemostasis was achieved.  No attempt was made to close the pericardium.  Left  pleural and mediastinal chest tubes were placed through separate subcostal incisions.  The sternum was closed with a combination of single and double heavy gauge stainless steel wires.  The pectoralis fascia, subcutaneous tissue, and skin were closed in standard fashion.  All sponge, needle, and instrument counts were correct at the end of the procedure.  The patient was taken from the operating room to the Surgical Intensive Care Unit intubated and in stable condition.     Salvatore Decent Dorris Fetch, M.D.     SCH/MEDQ  D:  05/29/2017  T:  05/29/2017  Job:  [id number]

## 2017-05-29 NOTE — Interval H&P Note (Signed)
History and Physical Interval Note:  Weak left radial pulse- will not attempt to use  05/29/2017 7:13 AM  Jon Garner.  has presented today for surgery, with the diagnosis of CAD  The various methods of treatment have been discussed with the patient and family. After consideration of risks, benefits and other options for treatment, the patient has consented to  Procedure(s): CORONARY ARTERY BYPASS GRAFTING (CABG) (N/A) TRANSESOPHAGEAL ECHOCARDIOGRAM (TEE) (N/A) as a surgical intervention .  The patient's history has been reviewed, patient examined, no change in status, stable for surgery.  I have reviewed the patient's chart and labs.  Questions were answered to the patient's satisfaction.     Loreli Slot

## 2017-05-29 NOTE — Brief Op Note (Signed)
05/27/2017 - 05/29/2017  5:26 PM  PATIENT:  Jon Garner.  52 y.o. male  PRE-OPERATIVE DIAGNOSIS:  CAD  POST-OPERATIVE DIAGNOSIS:  CAD  PROCEDURE:  Procedure(s): CORONARY ARTERY BYPASS GRAFTING (CABG)  X 4 WITH OPEN HARVESTING OF LEFT AND RIGHT SAPHENOUS VEINS (N/A) TRANSESOPHAGEAL ECHOCARDIOGRAM (TEE) (N/A) LIMA-LAD SVG-DIAG1 SVG-PD SVG-OM  SURGEON:  Surgeon(s) and Role:    * Loreli Slot, MD - Primary  PHYSICIAN ASSISTANT: WAYNE GOLD PA-C  ANESTHESIA:   general  EBL:  1100 mL   BLOOD ADMINISTERED:none  DRAINS: PLEURAL AND PERICARDIAL CHEST TUBES   LOCAL MEDICATIONS USED:  NONE  SPECIMEN:  No Specimen  DISPOSITION OF SPECIMEN:  N/A  COUNTS:  YES  TOURNIQUET:  * No tourniquets in log *  DICTATION: .Other Dictation: Dictation Number [id number]  PLAN OF CARE: Admit to inpatient   PATIENT DISPOSITION:  ICU - intubated and hemodynamically stable.   Delay start of Pharmacological VTE agent (>24hrs) due to surgical blood loss or risk of bleeding: yes

## 2017-05-29 NOTE — Anesthesia Procedure Notes (Signed)
Central Venous Catheter Insertion Performed by: Oleta Mouse, MD, anesthesiologist Start/End4/25/2019 7:11 AM, 05/29/2017 7:16 AM Patient location: Pre-op. Preanesthetic checklist: patient identified, IV checked, site marked, risks and benefits discussed, surgical consent, monitors and equipment checked, pre-op evaluation, timeout performed and anesthesia consent Position: Trendelenburg Lidocaine 1% used for infiltration and patient sedated Hand hygiene performed  and maximum sterile barriers used  Catheter size: 9 Fr Total catheter length 10. MAC introducer Procedure performed using ultrasound guided technique. Ultrasound Notes:anatomy identified, needle tip was noted to be adjacent to the nerve/plexus identified, no ultrasound evidence of intravascular and/or intraneural injection and image(s) printed for medical record Attempts: 1 Following insertion, line sutured, dressing applied and Biopatch. Post procedure assessment: blood return through all ports, free fluid flow and no air  Patient tolerated the procedure well with no immediate complications.

## 2017-05-29 NOTE — Anesthesia Postprocedure Evaluation (Signed)
Anesthesia Post Note  Patient: Jon Garner.  Procedure(s) Performed: CORONARY ARTERY BYPASS GRAFTING (CABG)  X 4 WITH OPEN HARVESTING OF LEFT AND RIGHT SAPHENOUS VEINS (N/A Chest) TRANSESOPHAGEAL ECHOCARDIOGRAM (TEE) (N/A )     Patient location during evaluation: SICU Anesthesia Type: General Level of consciousness: patient remains intubated per anesthesia plan Pain management: pain level controlled Vital Signs Assessment: post-procedure vital signs reviewed and stable Respiratory status: patient remains intubated per anesthesia plan Cardiovascular status: stable Anesthetic complications: no    Last Vitals:  Vitals:   05/29/17 0548 05/29/17 1505  BP: (!) 162/85 118/70  Pulse: 66 90  Resp:  12  Temp: 36.8 C   SpO2: 96% 95%    Last Pain:  Vitals:   05/29/17 0548  TempSrc: Oral  PainSc:                  Ravan Schlemmer

## 2017-05-29 NOTE — Progress Notes (Signed)
Paged dr. Morton Peters about patient having no pre op orders. Waiting for response.

## 2017-05-29 NOTE — Procedures (Signed)
Extubation Procedure Note  Patient Details:   Name: Jon Garner. DOB: Dec 05, 1965 MRN: [id number]   Airway Documentation:    Vent end date: 05/29/17 Vent end time: 2250   Evaluation  O2 sats: stable throughout Complications: No apparent complications Patient did tolerate procedure well. Bilateral Breath Sounds: Clear, Diminished(coarse )   Yes    Patient performed a NIF of >-40 and a VC 1.9 L.  Patient had a positive cuff leak and was able to perform IS of 1200 mL.  Patient was extubated to 4L [place]  Epifanio Lesches A 05/29/2017, 10:57 PM

## 2017-05-29 NOTE — Anesthesia Procedure Notes (Signed)
Arterial Line Insertion Start/End4/25/2019 8:15 AM, 05/29/2017 8:21 AM Performed by: Val Eagle, MD  Patient location: OR. Preanesthetic checklist: patient identified, IV checked, site marked, risks and benefits discussed, surgical consent, monitors and equipment checked, pre-op evaluation, timeout performed and anesthesia consent Right, brachial was placed Catheter size: 20 G Hand hygiene performed  and maximum sterile barriers used   Attempts: 1 Procedure performed using ultrasound guided technique. Ultrasound Notes:anatomy identified, needle tip was noted to be adjacent to the nerve/plexus identified, no ultrasound evidence of intravascular and/or intraneural injection and image(s) printed for medical record Following insertion, dressing applied and Biopatch. Post procedure assessment: normal and unchanged  Patient tolerated the procedure well with no immediate complications.

## 2017-05-29 NOTE — Transfer of Care (Signed)
Immediate Anesthesia Transfer of Care Note  Patient: Jon Garner.  Procedure(s) Performed: CORONARY ARTERY BYPASS GRAFTING (CABG)  X 4 WITH OPEN HARVESTING OF LEFT AND RIGHT SAPHENOUS VEINS (N/A Chest) TRANSESOPHAGEAL ECHOCARDIOGRAM (TEE) (N/A )  Patient Location: ICU  Anesthesia Type:General  Level of Consciousness: sedated and unresponsive  Airway & Oxygen Therapy: Patient remains intubated per anesthesia plan and Patient placed on Ventilator (see vital sign flow sheet for setting)  Post-op Assessment: Report given to RN and Post -op Vital signs reviewed and stable  Post vital signs: Reviewed and stable  Last Vitals:  Vitals Value Taken Time  BP 118/70 05/29/2017  3:05 PM  Temp 36.1 C 05/29/2017  3:17 PM  Pulse 89 05/29/2017  3:17 PM  Resp 14 05/29/2017  3:17 PM  SpO2 100 % 05/29/2017  3:17 PM  Vitals shown include unvalidated device data.  Last Pain:  Vitals:   05/29/17 0548  TempSrc: Oral  PainSc:          Complications: No apparent anesthesia complications

## 2017-05-29 NOTE — Anesthesia Preprocedure Evaluation (Signed)
Anesthesia Evaluation  Patient identified by MRN, date of birth, ID band Patient awake    Reviewed: Allergy & Precautions, NPO status , Patient's Chart, lab work & pertinent test results  Airway Mallampati: II  TM Distance: >3 FB     Dental   Pulmonary former smoker,    breath sounds clear to auscultation       Cardiovascular hypertension, + Past MI   Rhythm:Regular Rate:Normal     Neuro/Psych    GI/Hepatic Neg liver ROS, GERD  ,  Endo/Other  negative endocrine ROS  Renal/GU negative Renal ROS     Musculoskeletal   Abdominal   Peds  Hematology   Anesthesia Other Findings   Reproductive/Obstetrics                             Anesthesia Physical Anesthesia Plan  ASA: III  Anesthesia Plan: General   Post-op Pain Management:    Induction: Intravenous  PONV Risk Score and Plan: 2 and Treatment may vary due to age or medical condition, Ondansetron, Dexamethasone and Midazolam  Airway Management Planned: Oral ETT  Additional Equipment: Arterial line, PA Cath, Ultrasound Guidance Line Placement and TEE  Intra-op Plan:   Post-operative Plan: Possible Post-op intubation/ventilation  Informed Consent: I have reviewed the patients History and Physical, chart, labs and discussed the procedure including the risks, benefits and alternatives for the proposed anesthesia with the patient or authorized representative who has indicated his/her understanding and acceptance.   Dental advisory given  Plan Discussed with: Anesthesiologist and CRNA  Anesthesia Plan Comments:         Anesthesia Quick Evaluation

## 2017-05-29 NOTE — Anesthesia Procedure Notes (Signed)
Procedure Name: Intubation Date/Time: 05/29/2017 8:26 AM Performed by: Marena Chancy, CRNA Pre-anesthesia Checklist: Patient identified, Emergency Drugs available, Suction available and Patient being monitored Patient Re-evaluated:Patient Re-evaluated prior to induction Oxygen Delivery Method: Circle System Utilized Preoxygenation: Pre-oxygenation with 100% oxygen Induction Type: IV induction Ventilation: Mask ventilation with difficulty, Two handed mask ventilation required and Oral airway inserted - appropriate to patient size Laryngoscope Size: 4 and Glidescope Grade View: Grade II Tube type: Oral Tube size: 8.0 mm Number of attempts: 1 Airway Equipment and Method: Stylet and Oral airway Placement Confirmation: ETT inserted through vocal cords under direct vision,  positive ETCO2 and breath sounds checked- equal and bilateral Secured at: 22 cm Tube secured with: Tape Dental Injury: Teeth and Oropharynx as per pre-operative assessment  Difficulty Due To: Difficulty was anticipated and Difficult Airway- due to limited oral opening Comments: Intubated by Micki Riley SRNA

## 2017-05-29 NOTE — Plan of Care (Signed)
  Problem: Education: Goal: Knowledge of General Education information will improve Outcome: Progressing   Problem: Clinical Measurements: Goal: Will remain free from infection Outcome: Progressing   Problem: Cardiovascular: Goal: Vascular access site(s) Level 0-1 will be maintained Outcome: Progressing   Problem: Health Behavior/Discharge Planning: Goal: Ability to safely manage health-related needs after discharge will improve Outcome: Progressing   Problem: Health Behavior/Discharge Planning: Goal: Ability to manage health-related needs will improve Outcome: Not Progressing

## 2017-05-29 NOTE — Progress Notes (Signed)
      [address]       [place] [address]             [phone]      Intubated, has woken up  BP 118/70   Pulse 89   Temp 98.1 F (36.7 C)   Resp 14   Ht 5\' 8"  (1.727 m)   Wt 298 lb 1.6 oz (135.2 kg)   SpO2 100%   BMI 45.33 kg/m   Intake/Output Summary (Last 24 hours) at 05/29/2017 1724 Last data filed at 05/29/2017 1700 Gross per 24 hour  Intake 4465.61 ml  Output 3950 ml  Net 515.61 ml   Minimal CT output, high UO  Amiodarone started for atrial flutter intraop  Doing well early postop Continue to wean FiO2/ vent  Viviann Spare C. Dorris Fetch, MD [facility] [phone]

## 2017-05-30 ENCOUNTER — Inpatient Hospital Stay (HOSPITAL_COMMUNITY): Payer: Self-pay

## 2017-05-30 ENCOUNTER — Encounter (HOSPITAL_COMMUNITY): Payer: Self-pay | Admitting: *Deleted

## 2017-05-30 LAB — CBC
HEMATOCRIT: 37.9 % — AB (ref 39.0–52.0)
HEMATOCRIT: 37.7 % — AB (ref 39.0–52.0)
HEMOGLOBIN: 12.5 g/dL — AB (ref 13.0–17.0)
HEMOGLOBIN: 12.2 g/dL — AB (ref 13.0–17.0)
MCH: 29.1 pg (ref 26.0–34.0)
MCH: 29 pg (ref 26.0–34.0)
MCHC: 33 g/dL (ref 30.0–36.0)
MCHC: 32.4 g/dL (ref 30.0–36.0)
MCV: 88.1 fL (ref 78.0–100.0)
MCV: 89.8 fL (ref 78.0–100.0)
Platelets: 175 10*3/uL (ref 150–400)
Platelets: 177 10*3/uL (ref 150–400)
RBC: 4.3 MIL/uL (ref 4.22–5.81)
RBC: 4.2 MIL/uL — AB (ref 4.22–5.81)
RDW: 13.6 % (ref 11.5–15.5)
RDW: 14 % (ref 11.5–15.5)
WBC: 12.4 10*3/uL — ABNORMAL HIGH (ref 4.0–10.5)
WBC: 14.7 10*3/uL — ABNORMAL HIGH (ref 4.0–10.5)

## 2017-05-30 LAB — GLUCOSE, CAPILLARY
GLUCOSE-CAPILLARY: 119 mg/dL — AB (ref 65–99)
GLUCOSE-CAPILLARY: 123 mg/dL — AB (ref 65–99)
GLUCOSE-CAPILLARY: 140 mg/dL — AB (ref 65–99)
GLUCOSE-CAPILLARY: 143 mg/dL — AB (ref 65–99)
GLUCOSE-CAPILLARY: 145 mg/dL — AB (ref 65–99)
GLUCOSE-CAPILLARY: 91 mg/dL (ref 65–99)
Glucose-Capillary: 112 mg/dL — ABNORMAL HIGH (ref 65–99)
Glucose-Capillary: 118 mg/dL — ABNORMAL HIGH (ref 65–99)
Glucose-Capillary: 121 mg/dL — ABNORMAL HIGH (ref 65–99)
Glucose-Capillary: 122 mg/dL — ABNORMAL HIGH (ref 65–99)
Glucose-Capillary: 122 mg/dL — ABNORMAL HIGH (ref 65–99)
Glucose-Capillary: 125 mg/dL — ABNORMAL HIGH (ref 65–99)
Glucose-Capillary: 136 mg/dL — ABNORMAL HIGH (ref 65–99)
Glucose-Capillary: 149 mg/dL — ABNORMAL HIGH (ref 65–99)
Glucose-Capillary: 154 mg/dL — ABNORMAL HIGH (ref 65–99)
Glucose-Capillary: 82 mg/dL (ref 65–99)

## 2017-05-30 LAB — POCT I-STAT, CHEM 8
BUN: 16 mg/dL (ref 6–20)
CREATININE: 1.6 mg/dL — AB (ref 0.61–1.24)
Calcium, Ion: 1.13 mmol/L — ABNORMAL LOW (ref 1.15–1.40)
Chloride: 100 mmol/L — ABNORMAL LOW (ref 101–111)
GLUCOSE: 133 mg/dL — AB (ref 65–99)
HEMATOCRIT: 36 % — AB (ref 39.0–52.0)
Hemoglobin: 12.2 g/dL — ABNORMAL LOW (ref 13.0–17.0)
POTASSIUM: 4.2 mmol/L (ref 3.5–5.1)
Sodium: 136 mmol/L (ref 135–145)
TCO2: 23 mmol/L (ref 22–32)

## 2017-05-30 LAB — BASIC METABOLIC PANEL
Anion gap: 7 (ref 5–15)
BUN: 12 mg/dL (ref 6–20)
CHLORIDE: 109 mmol/L (ref 101–111)
CO2: 21 mmol/L — AB (ref 22–32)
CREATININE: 1.16 mg/dL (ref 0.61–1.24)
Calcium: 7.6 mg/dL — ABNORMAL LOW (ref 8.9–10.3)
GFR calc Af Amer: >60 mL/min (ref >60)
GFR calc non Af Amer: >60 mL/min (ref >60)
GLUCOSE: 124 mg/dL — AB (ref 65–99)
Potassium: 4 mmol/L (ref 3.5–5.1)
Sodium: 137 mmol/L (ref 135–145)

## 2017-05-30 LAB — CREATININE, SERUM
CREATININE: 1.7 mg/dL — AB (ref 0.61–1.24)
GFR, EST AFRICAN AMERICAN: 52 mL/min — AB (ref >60)
GFR, EST NON AFRICAN AMERICAN: 45 mL/min — AB (ref >60)

## 2017-05-30 LAB — POCT I-STAT 3, ART BLOOD GAS (G3+)
Acid-base deficit: 3 mmol/L — ABNORMAL HIGH (ref 0.0–2.0)
Acid-base deficit: 5 mmol/L — ABNORMAL HIGH (ref 0.0–2.0)
BICARBONATE: 19.5 mmol/L — AB (ref 20.0–28.0)
Bicarbonate: 21.2 mmol/L (ref 20.0–28.0)
O2 SAT: 90 %
O2 Saturation: 97 %
PH ART: 7.357 (ref 7.350–7.450)
PO2 ART: 67 mmHg — AB (ref 83.0–108.0)
Patient temperature: 38.4
TCO2: 21 mmol/L — AB (ref 22–32)
TCO2: 22 mmol/L (ref 22–32)
pCO2 arterial: 37.6 mmHg (ref 32.0–48.0)
pCO2 arterial: 38.4 mmHg (ref 32.0–48.0)
pH, Arterial: 7.328 — ABNORMAL LOW (ref 7.350–7.450)
pO2, Arterial: 105 mmHg (ref 83.0–108.0)

## 2017-05-30 LAB — MAGNESIUM
MAGNESIUM: 2.5 mg/dL — AB (ref 1.7–2.4)
Magnesium: 2.5 mg/dL — ABNORMAL HIGH (ref 1.7–2.4)

## 2017-05-30 MED ORDER — POTASSIUM CHLORIDE 10 MEQ/50ML IV SOLN
10.0000 meq | INTRAVENOUS | Status: AC
  Administered 2017-05-30 (×3): 10 meq via INTRAVENOUS

## 2017-05-30 MED ORDER — ORAL CARE MOUTH RINSE
15.0000 mL | Freq: Two times a day (BID) | OROMUCOSAL | Status: DC
Start: 2017-05-30 — End: 2017-06-04
  Administered 2017-05-30 – 2017-06-04 (×5): 15 mL via OROMUCOSAL

## 2017-05-30 MED ORDER — MUPIROCIN 2 % EX OINT
TOPICAL_OINTMENT | Freq: Two times a day (BID) | CUTANEOUS | Status: DC
Start: 2017-05-30 — End: 2017-06-04
  Administered 2017-05-30 (×2): via NASAL
  Administered 2017-05-31: 1 via NASAL
  Administered 2017-05-31 – 2017-06-04 (×8): via NASAL
  Filled 2017-05-30 (×2): qty 22

## 2017-05-30 MED ORDER — INSULIN DETEMIR 100 UNIT/ML ~~LOC~~ SOLN
25.0000 [IU] | Freq: Two times a day (BID) | SUBCUTANEOUS | Status: DC
  Administered 2017-05-30 – 2017-06-02 (×7): 25 [IU] via SUBCUTANEOUS
  Filled 2017-05-30 (×7): qty 0.25

## 2017-05-30 MED ORDER — ENOXAPARIN SODIUM 40 MG/0.4ML ~~LOC~~ SOLN
40.0000 mg | Freq: Every day | SUBCUTANEOUS | Status: DC
  Administered 2017-05-30 – 2017-06-01 (×3): 40 mg via SUBCUTANEOUS
  Filled 2017-05-30 (×3): qty 0.4

## 2017-05-30 MED ORDER — FUROSEMIDE 10 MG/ML IJ SOLN
40.0000 mg | Freq: Once | INTRAMUSCULAR | Status: AC
  Administered 2017-05-30: 40 mg via INTRAVENOUS
  Filled 2017-05-30: qty 4

## 2017-05-30 MED ORDER — INSULIN ASPART 100 UNIT/ML ~~LOC~~ SOLN
0.0000 [IU] | SUBCUTANEOUS | Status: DC
  Administered 2017-05-30 (×2): 2 [IU] via SUBCUTANEOUS
  Administered 2017-05-30: 4 [IU] via SUBCUTANEOUS
  Administered 2017-05-31 (×2): 2 [IU] via SUBCUTANEOUS

## 2017-05-30 MED FILL — Potassium Chloride Inj 2 mEq/ML: INTRAVENOUS | Qty: 40 | Status: AC

## 2017-05-30 MED FILL — Heparin Sodium (Porcine) Inj 1000 Unit/ML: INTRAMUSCULAR | Qty: 30 | Status: AC

## 2017-05-30 MED FILL — Magnesium Sulfate Inj 50%: INTRAMUSCULAR | Qty: 10 | Status: AC

## 2017-05-30 NOTE — Progress Notes (Signed)
Patient ID: Jon Garner., male   DOB: 1965/12/18, 52 y.o.   MRN: [id number] EVENING ROUNDS NOTE :     [address]       [place] [id number]             [phone]                 1 Day Post-Op Procedure(s) (LRB): CORONARY ARTERY BYPASS GRAFTING (CABG)  X 4 WITH OPEN HARVESTING OF LEFT AND RIGHT SAPHENOUS VEINS (N/A) TRANSESOPHAGEAL ECHOCARDIOGRAM (TEE) (N/A)  Total Length of Stay:  LOS: 3 days  BP (!) 96/43 (BP Location: Right Wrist)   Pulse 92   Temp 98.5 F (36.9 C) (Oral)   Resp (!) 22   Ht 5\' 8"  (1.727 m)   Wt (!) 305 lb (138.3 kg)   SpO2 96%   BMI 46.38 kg/m   .Intake/Output      04/25 0701 - 04/26 0700 04/26 0701 - 04/27 0700   P.O.     I.V. (mL/kg) 3754.9 (27.2) 293.6 (2.1)   Blood 900    IV Piggyback 1100    Total Intake(mL/kg) 5754.9 (41.6) 293.6 (2.1)   Urine (mL/kg/hr) 3250 (1) 375 (0.3)   Blood 1100    Chest Tube 430 10   Total Output 4780 385   Net +974.9 -91.4          . sodium chloride    . amiodarone 30 mg/hr (05/30/17 1400)  . lactated ringers    . lactated ringers    . lactated ringers 20 mL/hr at 05/30/17 1400  . nitroGLYCERIN Stopped (05/29/17 2309)  . phenylephrine (NEO-SYNEPHRINE) Adult infusion       Lab Results  Component Value Date   WBC 14.7 (H) 05/30/2017   HGB 12.2 (L) 05/30/2017   HGB 12.2 (L) 05/30/2017   HCT 37.7 (L) 05/30/2017   HCT 36.0 (L) 05/30/2017   PLT 177 05/30/2017   GLUCOSE 133 (H) 05/30/2017   CHOL 135 05/27/2017   TRIG 140 05/27/2017   HDL 25 (L) 05/27/2017   LDLCALC 82 05/27/2017   ALT 43 05/25/2017   AST 43 (H) 05/25/2017   NA 136 05/30/2017   K 4.2 05/30/2017   CL 100 (L) 05/30/2017   CREATININE 1.70 (H) 05/30/2017   CREATININE 1.60 (H) 05/30/2017   BUN 16 05/30/2017   CO2 21 (L) 05/30/2017   INR 1.19 05/29/2017    Has base line problem with knees after orth  surgery Pt conslt Sinu rhythm  Delight Ovens MD  Beeper [phone] Office [phone] 05/30/2017 5:27  PM

## 2017-05-30 NOTE — Progress Notes (Signed)
1 Day Post-Op Procedure(s) (LRB): CORONARY ARTERY BYPASS GRAFTING (CABG)  X 4 WITH OPEN HARVESTING OF LEFT AND RIGHT SAPHENOUS VEINS (N/A) TRANSESOPHAGEAL ECHOCARDIOGRAM (TEE) (N/A) Subjective: Feels well, minimal pain  Objective: Vital signs in last 24 hours: Temp:  [97 F (36.1 C)-101.3 F (38.5 C)] 100.4 F (38 C) (04/26 0700) Pulse Rate:  [76-90] 76 (04/26 0700) Cardiac Rhythm: Normal sinus rhythm (04/26 0532) Resp:  [11-39] 20 (04/26 0700) BP: (115-118)/(70-82) 115/82 (04/25 1745) SpO2:  [94 %-100 %] 99 % (04/26 0700) Arterial Line BP: (78-143)/(49-82) 103/50 (04/26 0700) FiO2 (%):  [40 %-80 %] 40 % (04/25 2155) Weight:  [305 lb (138.3 kg)] 305 lb (138.3 kg) (04/26 0500)  Hemodynamic parameters for last 24 hours: PAP: (27-51)/(13-27) 43/19 CO:  [4.2 L/min-5.9 L/min] 5.5 L/min CI:  [1.7 L/min/m2-2.4 L/min/m2] 2.3 L/min/m2  Intake/Output from previous day: 04/25 0701 - 04/26 0700 In: 5754.9 [I.V.:3754.9; Blood:900; IV Piggyback:1100] Out: 4780 [Urine:3250; Blood:1100; Chest Tube:430] Intake/Output this shift: No intake/output data recorded.  General appearance: alert, cooperative and no distress Neurologic: intact Heart: regular rate and rhythm Lungs: diminished breath sounds bibasilar Abdomen: normal findings: soft, non-tender  Lab Results: Recent Labs    05/29/17 2117 05/30/17 0412  WBC 10.4 12.4*  HGB 13.4 12.5*  HCT 40.2 37.9*  PLT 174 175   BMET:  Recent Labs    05/29/17 2107 05/29/17 2117 05/30/17 0412  NA 140  --  137  K 4.7  --  4.0  CL 107  --  109  CO2  --   --  21*  GLUCOSE 141*  --  124*  BUN 11  --  12  CREATININE 0.90 0.99 1.16  CALCIUM  --   --  7.6*    PT/INR:  Recent Labs    05/29/17 1519  LABPROT 15.0  INR 1.19   ABG    Component Value Date/Time   PHART 7.357 05/30/2017 0107   HCO3 21.2 05/30/2017 0107   TCO2 22 05/30/2017 0107   ACIDBASEDEF 3.0 (H) 05/30/2017 0107   O2SAT 97.0 05/30/2017 0107   CBG (last 3)  Recent  Labs    05/30/17 0408 05/30/17 0501 05/30/17 0604  GLUCAP 112* 118* 122*    Assessment/Plan: S/P Procedure(s) (LRB): CORONARY ARTERY BYPASS GRAFTING (CABG)  X 4 WITH OPEN HARVESTING OF LEFT AND RIGHT SAPHENOUS VEINS (N/A) TRANSESOPHAGEAL ECHOCARDIOGRAM (TEE) (N/A) -Doing well POD # 1 CV- good hemodynamics- dc swan and A line  ASA, metoprolol, atorvastatin,  Resume ACE-I prior to dc  RESP- Continue IS  RENAL- creatinine and lytes OK  Diurese for volume '  ENDO_ CBG well controlled with drip- transition to levemir + SSI  SCD + enoxaparin for DVT prophylaxis  Cardiac rehab   LOS: 3 days    Loreli Slot 05/30/2017

## 2017-05-30 NOTE — Care Management Note (Signed)
Case Management Note Donn Pierini RN, BSN Unit 4E-Case Manager--- 2H coverage [phone]  Patient Details  Name: Jon Garner. MRN: [id number] Date of Birth: [birth year]  Subjective/Objective:  Pt admitted with NSTEMI, s/p CABGx4 on 05/29/17                  Action/Plan: PTA pt lived at home, independent- anticipate return home- CM to follow for transition of care needs  Expected Discharge Date:                  Expected Discharge Plan:  Home/Self Care  In-House Referral:     Discharge planning Services  CM Consult  Post Acute Care Choice:    Choice offered to:     DME Arranged:    DME Agency:     HH Arranged:    HH Agency:     Status of Service:  In process, will continue to follow  If discussed at Long Length of Stay Meetings, dates discussed:    Discharge Disposition:   Additional Comments:  Darrold Span, RN 05/30/2017, 12:31 PM

## 2017-05-30 NOTE — Plan of Care (Signed)
°  Problem: Education: °Goal: Understanding of CV disease, CV risk reduction, and recovery process will improve °Outcome: Progressing °  °Problem: Activity: °Goal: Ability to return to baseline activity level will improve °Outcome: Progressing °  °Problem: Cardiovascular: °Goal: Vascular access site(s) Level 0-1 will be maintained °Outcome: Progressing °  °

## 2017-05-31 ENCOUNTER — Inpatient Hospital Stay (HOSPITAL_COMMUNITY): Payer: Self-pay

## 2017-05-31 DIAGNOSIS — Z951 Presence of aortocoronary bypass graft: Secondary | ICD-10-CM

## 2017-05-31 LAB — COMPREHENSIVE METABOLIC PANEL
ALT: 32 U/L (ref 17–63)
AST: 40 U/L (ref 15–41)
Albumin: 2.7 g/dL — ABNORMAL LOW (ref 3.5–5.0)
Alkaline Phosphatase: 53 U/L (ref 38–126)
Anion gap: 9 (ref 5–15)
BUN: 15 mg/dL (ref 6–20)
CO2: 25 mmol/L (ref 22–32)
Calcium: 8 mg/dL — ABNORMAL LOW (ref 8.9–10.3)
Chloride: 100 mmol/L — ABNORMAL LOW (ref 101–111)
Creatinine, Ser: 1.53 mg/dL — ABNORMAL HIGH (ref 0.61–1.24)
GFR calc Af Amer: 59 mL/min — ABNORMAL LOW (ref >60)
GFR calc non Af Amer: 51 mL/min — ABNORMAL LOW (ref >60)
Glucose, Bld: 131 mg/dL — ABNORMAL HIGH (ref 65–99)
Potassium: 3.8 mmol/L (ref 3.5–5.1)
Sodium: 134 mmol/L — ABNORMAL LOW (ref 135–145)
Total Bilirubin: 1.1 mg/dL (ref 0.3–1.2)
Total Protein: 5.3 g/dL — ABNORMAL LOW (ref 6.5–8.1)

## 2017-05-31 LAB — CBC
HCT: 35.2 % — ABNORMAL LOW (ref 39.0–52.0)
HEMOGLOBIN: 11.4 g/dL — AB (ref 13.0–17.0)
MCH: 29.3 pg (ref 26.0–34.0)
MCHC: 32.4 g/dL (ref 30.0–36.0)
MCV: 90.5 fL (ref 78.0–100.0)
Platelets: 178 10*3/uL (ref 150–400)
RBC: 3.89 MIL/uL — ABNORMAL LOW (ref 4.22–5.81)
RDW: 14.4 % (ref 11.5–15.5)
WBC: 14.6 10*3/uL — AB (ref 4.0–10.5)

## 2017-05-31 LAB — GLUCOSE, CAPILLARY
GLUCOSE-CAPILLARY: 108 mg/dL — AB (ref 65–99)
GLUCOSE-CAPILLARY: 109 mg/dL — AB (ref 65–99)
GLUCOSE-CAPILLARY: 114 mg/dL — AB (ref 65–99)
Glucose-Capillary: 123 mg/dL — ABNORMAL HIGH (ref 65–99)
Glucose-Capillary: 118 mg/dL — ABNORMAL HIGH (ref 65–99)

## 2017-05-31 MED ORDER — AMIODARONE HCL 200 MG PO TABS: 200.0000 mg | ORAL_TABLET | Freq: Every day | ORAL | Status: DC

## 2017-05-31 MED ORDER — AMIODARONE HCL IN DEXTROSE 360-4.14 MG/200ML-% IV SOLN: 60.0000 mg/h | INTRAVENOUS | Status: DC

## 2017-05-31 MED ORDER — AMIODARONE HCL IN DEXTROSE 360-4.14 MG/200ML-% IV SOLN: 30.0000 mg/h | INTRAVENOUS | Status: DC

## 2017-05-31 MED ORDER — AMIODARONE HCL 200 MG PO TABS: 200.0000 mg | ORAL_TABLET | Freq: Two times a day (BID) | ORAL | Status: DC

## 2017-05-31 MED ORDER — AMIODARONE HCL 200 MG PO TABS
200.0000 mg | ORAL_TABLET | Freq: Two times a day (BID) | ORAL | Status: DC
  Administered 2017-05-31 (×2): 200 mg via ORAL
  Filled 2017-05-31 (×2): qty 1

## 2017-05-31 MED ORDER — FUROSEMIDE 10 MG/ML IJ SOLN
40.0000 mg | Freq: Two times a day (BID) | INTRAMUSCULAR | Status: AC
  Administered 2017-05-31 (×2): 40 mg via INTRAVENOUS
  Filled 2017-05-31 (×2): qty 4

## 2017-05-31 NOTE — Progress Notes (Signed)
Tyrone Sage, MD verbal order for 40 mg IV Lasix Q12H for 2 doses. Order entered and will administer.  Delories Heinz, RN

## 2017-05-31 NOTE — Progress Notes (Signed)
Patient ID: Jon Birks., male   DOB: [birth year], 52 y.o.   MRN: [id number] TCTS DAILY ICU PROGRESS NOTE                   [address]            [place] [address]          [phone]   2 Days Post-Op Procedure(s) (LRB): CORONARY ARTERY BYPASS GRAFTING (CABG)  X 4 WITH OPEN HARVESTING OF LEFT AND RIGHT SAPHENOUS VEINS (N/A) TRANSESOPHAGEAL ECHOCARDIOGRAM (TEE) (N/A)  Total Length of Stay:  LOS: 4 days   Subjective: Up to chair and ambulated this morning, holding sinus rhythm  Objective: Vital signs in last 24 hours: Temp:  [97.8 F (36.6 C)-98.8 F (37.1 C)] 97.8 F (36.6 C) (04/27 0724) Pulse Rate:  [72-92] 72 (04/27 0700) Cardiac Rhythm: Normal sinus rhythm (04/27 0740) Resp:  [12-31] 12 (04/27 0700) BP: (94-138)/(43-73) 106/56 (04/27 0700) SpO2:  [90 %-100 %] 97 % (04/27 0700) Arterial Line BP: (100-179)/(50-70) 179/70 (04/26 1300) Weight:  [311 lb 8.2 oz (141.3 kg)] 311 lb 8.2 oz (141.3 kg) (04/27 0600)  Filed Weights   05/29/17 0548 05/30/17 0500 05/31/17 0600  Weight: 298 lb 1.6 oz (135.2 kg) (!) 305 lb (138.3 kg) (!) 311 lb 8.2 oz (141.3 kg)    Weight change: 6 lb 8.2 oz (2.953 kg)   Hemodynamic parameters for last 24 hours:    Intake/Output from previous day: 04/26 0701 - 04/27 0700 In: 797.5 [I.V.:797.5] Out: 965 [Urine:955; Chest Tube:10]  Intake/Output this shift: No intake/output data recorded.  Current Meds: Scheduled Meds: . acetaminophen  1,000 mg Oral Q6H   Or  . acetaminophen (TYLENOL) oral liquid 160 mg/5 mL  1,000 mg Per Tube Q6H  . aspirin EC  325 mg Oral Daily   Or  . aspirin  324 mg Per Tube Daily  . atorvastatin  80 mg Oral q1800  . bisacodyl  10 mg Oral Daily   Or  . bisacodyl  10 mg Rectal Daily  . Chlorhexidine Gluconate Cloth  6 each Topical Daily  . docusate sodium  200 mg Oral Daily  . enoxaparin (LOVENOX) injection  40 mg Subcutaneous QHS  . insulin aspart  0-24 Units Subcutaneous Q4H  . insulin  detemir  25 Units Subcutaneous BID  . mouth rinse  15 mL Mouth Rinse BID  . metoprolol tartrate  12.5 mg Oral BID   Or  . metoprolol tartrate  12.5 mg Per Tube BID  . mupirocin ointment   Nasal BID  . pantoprazole  40 mg Oral Daily  . sodium chloride flush  10-40 mL Intracatheter Q12H  . sodium chloride flush  3 mL Intravenous Q12H   Continuous Infusions: . sodium chloride    . amiodarone 30 mg/hr (05/31/17 0051)  . lactated ringers    . lactated ringers    . lactated ringers 10 mL/hr at 05/30/17 1900  . nitroGLYCERIN Stopped (05/29/17 2309)  . phenylephrine (NEO-SYNEPHRINE) Adult infusion     PRN Meds:.lactated ringers, metoprolol tartrate, morphine injection, ondansetron (ZOFRAN) IV, oxyCODONE, sodium chloride flush, sodium chloride flush, traMADol  General appearance: alert, cooperative and no distress Neurologic: intact Heart: regular rate and rhythm, S1, S2 normal, no murmur, click, rub or gallop Lungs: diminished breath sounds bibasilar Abdomen: soft, non-tender; bowel sounds normal; no masses,  no organomegaly Extremities: extremities normal, atraumatic, no cyanosis  Homans sign is negative, significant bilateral thigh and calf edema, hand  edema has decreased Wound: Sternum stable  Lab Results: CBC: Recent Labs    05/30/17 1616 05/31/17 0500  WBC 14.7* 14.6*  HGB 12.2*  12.2* 11.4*  HCT 37.7*  36.0* 35.2*  PLT 177 178   BMET:  Recent Labs    05/30/17 0412 05/30/17 1616 05/31/17 0401  NA 137 136 134*  K 4.0 4.2 3.8  CL 109 100* 100*  CO2 21*  --  25  GLUCOSE 124* 133* 131*  BUN '12 16 15  '$ CREATININE 1.16 1.70*  1.60* 1.53*  CALCIUM 7.6*  --  8.0*    CMET: Lab Results  Component Value Date   WBC 14.6 (H) 05/31/2017   HGB 11.4 (L) 05/31/2017   HCT 35.2 (L) 05/31/2017   PLT 178 05/31/2017   GLUCOSE 131 (H) 05/31/2017   CHOL 135 05/27/2017   TRIG 140 05/27/2017   HDL 25 (L) 05/27/2017   LDLCALC 82 05/27/2017   ALT 32 05/31/2017   AST 40  05/31/2017   NA 134 (L) 05/31/2017   K 3.8 05/31/2017   CL 100 (L) 05/31/2017   CREATININE 1.53 (H) 05/31/2017   BUN 15 05/31/2017   CO2 25 05/31/2017   INR 1.19 05/29/2017      PT/INR:  Recent Labs    05/29/17 1519  LABPROT 15.0  INR 1.19   Radiology: No results found.  Acute Kidney Injury (any one)  Increase in SCr by > 0.3 within 48 hours  Increase SCr to > 1.5 times baseline  Urine volume < 0.5 ml/kg/h for 6 hrs  ?Stage 1 - Increase in serum creatinine to 1.5 to 1.9 times baseline, or increase in serum creatinine by ?0.3 mg/dL (?26.5 micromol/L), or reduction in urine output to <0.5 mL/kg per hour for 6 to 12 hours.  ?Stage 2 - Increase in serum creatinine to 2.0 to 2.9 times baseline, or reduction in urine output to <0.5 mL/kg per hour for ?12 hours.  ?Stage 3 - Increase in serum creatinine to 3.0 times baseline, or increase in serum creatinine to ?4.0 mg/dL (?353.6 micromol/L), or reduction in urine output to <0.3 mL/kg per hour for ?24 hours, or anuria for ?12 hours, or the initiation of renal replacement therapy, or, in patients <18 years, decrease in eGFR to <35 mL   Lab Results  Component Value Date   CREATININE 1.53 (H) 05/31/2017   Estimated Creatinine Clearance: 78 mL/min (A) (by C-G formula based on SCr of 1.53 mg/dL (H)).  Assessment/Plan: S/P Procedure(s) (LRB): CORONARY ARTERY BYPASS GRAFTING (CABG)  X 4 WITH OPEN HARVESTING OF LEFT AND RIGHT SAPHENOUS VEINS (N/A) TRANSESOPHAGEAL ECHOCARDIOGRAM (TEE) (N/A) Mobilize Diuresis Diabetes control Mild elevation of cr 1.5 - stage 1 AKI Continue lasix , leave foley additional day to monitor uop Holding sinus- convert to po Cordarone    Grace Isaac 05/31/2017 7:57 AM

## 2017-05-31 NOTE — Evaluation (Signed)
Physical Therapy Evaluation Patient Details Name: Jon Garner. MRN: [id number] DOB: Jan 02, 1966 Today's Date: 05/31/2017   History of Present Illness  Pt adm with chest pain and found to have NSTEMI. 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 PMH - PVD, ICA occlusion, obesity, knee surgery  Clinical Impression  Pt admitted with above diagnosis and presents to PT with functional limitations due to deficits listed below (See PT problem list). Pt needs skilled PT to maximize independence and safety to allow discharge to home with family. Expect pt will make good progress and doubt will need PT after DC.     Follow Up Recommendations Other (comment)(possibly cardiac rehab phase II)    Equipment Recommendations  Other (comment)(rollator)    Recommendations for Other Services       Precautions / Restrictions Precautions Precautions: Sternal      Mobility  Bed Mobility               General bed mobility comments: Pt up in chair  Transfers Overall transfer level: Needs assistance Equipment used: 4-wheeled walker Transfers: Sit to/from Stand Sit to Stand: Min guard         General transfer comment: Assist for lines and safety. Pt placed hands on knees to come to stand. Followed sternal precautions without cuing  Ambulation/Gait Ambulation/Gait assistance: Min guard Ambulation Distance (Feet): 250 Feet Assistive device: 4-wheeled walker Gait Pattern/deviations: Step-through pattern;Decreased stride length;Trunk flexed Gait velocity: decr Gait velocity interpretation: 1.31 - 2.62 ft/sec, indicative of limited community ambulator General Gait Details: Assist for safety and lines. Pt with knees in flexion due to prior knee surgery. Pt amb on RA with SpO2 to 86%. Took 4 standing rest breaks.   Stairs            Wheelchair Mobility    Modified Rankin (Stroke Patients Only)       Balance Overall balance assessment: Mild deficits observed, not formally tested                                            Pertinent Vitals/Pain Pain Assessment: Faces Faces Pain Scale: Hurts little more Pain Location: knees Pain Descriptors / Indicators: Grimacing;Guarding Pain Intervention(s): Limited activity within patient's tolerance;Monitored during session    Home Living Family/patient expects to be discharged to:: Private residence Living Arrangements: Parent;Other relatives(mother and 2 brothers) Available Help at Discharge: Family Type of Home: House       Home Layout: Two level;Bed/bath upstairs Home Equipment: None      Prior Function Level of Independence: Independent               Hand Dominance        Extremity/Trunk Assessment   Upper Extremity Assessment Upper Extremity Assessment: Overall WFL for tasks assessed    Lower Extremity Assessment Lower Extremity Assessment: RLE deficits/detail;LLE deficits/detail RLE Deficits / Details: Pt lacks full knee extension due to prior surgery LLE Deficits / Details: Pt lacks full knee extension due to prior surgery       Communication   Communication: No difficulties  Cognition Arousal/Alertness: Awake/alert Behavior During Therapy: WFL for tasks assessed/performed Overall Cognitive Status: Within Functional Limits for tasks assessed  General Comments      Exercises     Assessment/Plan    PT Assessment Patient needs continued PT services  PT Problem List Decreased activity tolerance;Decreased mobility;Decreased knowledge of precautions;Cardiopulmonary status limiting activity;Obesity       PT Treatment Interventions DME instruction;Gait training;Stair training;Functional mobility training;Therapeutic activities;Therapeutic exercise;Balance training;Patient/family education    PT Goals (Current goals can be found in the Care Plan section)  Acute Rehab PT Goals Patient Stated Goal: return home PT Goal  Formulation: With patient Time For Goal Achievement: 06/14/17 Potential to Achieve Goals: Good    Frequency Min 3X/week   Barriers to discharge Inaccessible home environment bed/bath upstairs    Co-evaluation               AM-PAC PT "6 Clicks" Daily Activity  Outcome Measure Difficulty turning over in bed (including adjusting bedclothes, sheets and blankets)?: Unable Difficulty moving from lying on back to sitting on the side of the bed? : Unable Difficulty sitting down on and standing up from a chair with arms (e.g., wheelchair, bedside commode, etc,.)?: A Little Help needed moving to and from a bed to chair (including a wheelchair)?: A Little Help needed walking in hospital room?: A Little Help needed climbing 3-5 steps with a railing? : A Little 6 Click Score: 14    End of Session   Activity Tolerance: Patient limited by fatigue Patient left: in chair;with call bell/phone within reach Nurse Communication: Mobility status PT Visit Diagnosis: Other abnormalities of gait and mobility (R26.89);Difficulty in walking, not elsewhere classified (R26.2)    Time: 1610-9604 PT Time Calculation (min) (ACUTE ONLY): 20 min   Charges:   PT Evaluation $PT Eval Moderate Complexity: 1 Mod     PT G CodesMarland Kitchen        [facility] PT [phone]   Angelina Ok [facility] 05/31/2017, 2:11 PM

## 2017-05-31 NOTE — Progress Notes (Signed)
Patient ID: Jon Love., male   DOB: [birth year], 53 y.o.   MRN: [id number] EVENING ROUNDS NOTE :     [address]       [place] [address]             [phone]                 2 Days Post-Op Procedure(s) (LRB): CORONARY ARTERY BYPASS GRAFTING (CABG)  X 4 WITH OPEN HARVESTING OF LEFT AND RIGHT SAPHENOUS VEINS (N/A) TRANSESOPHAGEAL ECHOCARDIOGRAM (TEE) (N/A)  Total Length of Stay:  LOS: 4 days  BP 129/79   Pulse 92   Temp 98.4 F (36.9 C) (Oral)   Resp (!) 21   Ht 5\' 8"  (1.727 m)   Wt (!) 311 lb 8.2 oz (141.3 kg)   SpO2 96%   BMI 47.36 kg/m   .Intake/Output      04/27 0701 - 04/28 0700   P.O. 150   I.V. (mL/kg) 53.4 (0.4)   Total Intake(mL/kg) 203.4 (1.4)   Urine (mL/kg/hr) 400 (0.2)   Chest Tube    Total Output 400   Net -196.6         . sodium chloride    . amiodarone Stopped (05/31/17 0936)  . lactated ringers    . lactated ringers    . lactated ringers 10 mL/hr at 05/30/17 1900  . nitroGLYCERIN Stopped (05/29/17 2309)  . phenylephrine (NEO-SYNEPHRINE) Adult infusion       Lab Results  Component Value Date   WBC 14.6 (H) 05/31/2017   HGB 11.4 (L) 05/31/2017   HCT 35.2 (L) 05/31/2017   PLT 178 05/31/2017   GLUCOSE 131 (H) 05/31/2017   CHOL 135 05/27/2017   TRIG 140 05/27/2017   HDL 25 (L) 05/27/2017   LDLCALC 82 05/27/2017   ALT 32 05/31/2017   AST 40 05/31/2017   NA 134 (L) 05/31/2017   K 3.8 05/31/2017   CL 100 (L) 05/31/2017   CREATININE 1.53 (H) 05/31/2017   BUN 15 05/31/2017   CO2 25 05/31/2017   INR 1.19 05/29/2017   Stable day Responding to lasix 600 past shift   Delight Ovens MD  Beeper [phone] Office [phone] 05/31/2017 7:19 PM

## 2017-05-31 NOTE — Progress Notes (Signed)
Patient ambulated 370 ft with avasys walker on 2LNC. Pt tolerated fairly well. Pt became sweaty but recovered very quickly. Pt is very proud of himself, stating, " wow, before surgery I would sweat more than this and I walked further!" . Will continue to monitor and ambulate. Pt now resting in recliner.  Delories Heinz, RN

## 2017-06-01 ENCOUNTER — Inpatient Hospital Stay (HOSPITAL_COMMUNITY): Payer: Self-pay

## 2017-06-01 LAB — GLUCOSE, CAPILLARY
GLUCOSE-CAPILLARY: 102 mg/dL — AB (ref 65–99)
GLUCOSE-CAPILLARY: 91 mg/dL (ref 65–99)
GLUCOSE-CAPILLARY: 160 mg/dL — AB (ref 65–99)
GLUCOSE-CAPILLARY: 76 mg/dL (ref 65–99)
Glucose-Capillary: 75 mg/dL (ref 65–99)
Glucose-Capillary: 92 mg/dL (ref 65–99)
Glucose-Capillary: 105 mg/dL — ABNORMAL HIGH (ref 65–99)

## 2017-06-01 LAB — TSH: TSH: 3.225 u[IU]/mL (ref 0.350–4.500)

## 2017-06-01 LAB — BASIC METABOLIC PANEL
Anion gap: 8 (ref 5–15)
BUN: 17 mg/dL (ref 6–20)
CO2: 28 mmol/L (ref 22–32)
Calcium: 7.8 mg/dL — ABNORMAL LOW (ref 8.9–10.3)
Chloride: 100 mmol/L — ABNORMAL LOW (ref 101–111)
Creatinine, Ser: 1.33 mg/dL — ABNORMAL HIGH (ref 0.61–1.24)
GFR calc Af Amer: >60 mL/min (ref >60)
GFR calc non Af Amer: 60 mL/min — ABNORMAL LOW (ref >60)
Glucose, Bld: 92 mg/dL (ref 65–99)
Potassium: 3.5 mmol/L (ref 3.5–5.1)
Sodium: 136 mmol/L (ref 135–145)

## 2017-06-01 LAB — CBC
HCT: 31.4 % — ABNORMAL LOW (ref 39.0–52.0)
Hemoglobin: 10.4 g/dL — ABNORMAL LOW (ref 13.0–17.0)
MCH: 29.3 pg (ref 26.0–34.0)
MCHC: 33.1 g/dL (ref 30.0–36.0)
MCV: 88.5 fL (ref 78.0–100.0)
Platelets: 182 10*3/uL (ref 150–400)
RBC: 3.55 MIL/uL — ABNORMAL LOW (ref 4.22–5.81)
RDW: 14.1 % (ref 11.5–15.5)
WBC: 12.8 10*3/uL — ABNORMAL HIGH (ref 4.0–10.5)

## 2017-06-01 MED ORDER — ONDANSETRON HCL 4 MG/2ML IJ SOLN
4.0000 mg | Freq: Four times a day (QID) | INTRAMUSCULAR | Status: DC | PRN
Start: 2017-06-01 — End: 2017-06-04

## 2017-06-01 MED ORDER — ONDANSETRON HCL 4 MG PO TABS: 4.0000 mg | ORAL_TABLET | Freq: Four times a day (QID) | ORAL | Status: DC | PRN

## 2017-06-01 MED ORDER — OXYCODONE HCL 5 MG PO TABS: 5.0000 mg | ORAL_TABLET | ORAL | Status: DC | PRN

## 2017-06-01 MED ORDER — PANTOPRAZOLE SODIUM 40 MG PO TBEC
40.0000 mg | DELAYED_RELEASE_TABLET | Freq: Every day | ORAL | Status: DC
  Administered 2017-06-01 – 2017-06-04 (×4): 40 mg via ORAL
  Filled 2017-06-01 (×4): qty 1

## 2017-06-01 MED ORDER — POTASSIUM CHLORIDE 10 MEQ/50ML IV SOLN
10.0000 meq | INTRAVENOUS | Status: DC
  Administered 2017-06-01 (×2): 10 meq via INTRAVENOUS
  Filled 2017-06-01 (×3): qty 50

## 2017-06-01 MED ORDER — METOPROLOL TARTRATE 12.5 MG HALF TABLET
12.5000 mg | ORAL_TABLET | Freq: Two times a day (BID) | ORAL | Status: DC
  Administered 2017-06-01 – 2017-06-02 (×3): 12.5 mg via ORAL
  Filled 2017-06-01 (×3): qty 1

## 2017-06-01 MED ORDER — INSULIN ASPART 100 UNIT/ML ~~LOC~~ SOLN
0.0000 [IU] | Freq: Three times a day (TID) | SUBCUTANEOUS | Status: DC
  Administered 2017-06-02: 2 [IU] via SUBCUTANEOUS

## 2017-06-01 MED ORDER — SODIUM CHLORIDE 0.9% FLUSH: 3.0000 mL | INTRAVENOUS | Status: DC | PRN

## 2017-06-01 MED ORDER — POTASSIUM CHLORIDE CRYS ER 20 MEQ PO TBCR
20.0000 meq | EXTENDED_RELEASE_TABLET | Freq: Every day | ORAL | Status: DC
  Administered 2017-06-02: 20 meq via ORAL
  Filled 2017-06-01: qty 1

## 2017-06-01 MED ORDER — ACETAMINOPHEN 325 MG PO TABS: 650.0000 mg | ORAL_TABLET | Freq: Four times a day (QID) | ORAL | Status: DC | PRN

## 2017-06-01 MED ORDER — BISACODYL 5 MG PO TBEC: 10.0000 mg | DELAYED_RELEASE_TABLET | Freq: Every day | ORAL | Status: DC | PRN

## 2017-06-01 MED ORDER — SODIUM CHLORIDE 0.9% FLUSH
3.0000 mL | Freq: Two times a day (BID) | INTRAVENOUS | Status: DC
  Administered 2017-06-01 – 2017-06-04 (×7): 3 mL via INTRAVENOUS

## 2017-06-01 MED ORDER — DIPHENHYDRAMINE HCL 12.5 MG/5ML PO ELIX
12.5000 mg | ORAL_SOLUTION | Freq: Once | ORAL | Status: AC
  Administered 2017-06-01: 12.5 mg via ORAL
  Filled 2017-06-01: qty 5

## 2017-06-01 MED ORDER — POTASSIUM CHLORIDE 10 MEQ/50ML IV SOLN: 10.0000 meq | INTRAVENOUS | Status: DC | PRN

## 2017-06-01 MED ORDER — AMIODARONE HCL 200 MG PO TABS
200.0000 mg | ORAL_TABLET | Freq: Two times a day (BID) | ORAL | Status: DC
  Administered 2017-06-01 – 2017-06-04 (×7): 200 mg via ORAL
  Filled 2017-06-01 (×7): qty 1

## 2017-06-01 MED ORDER — FUROSEMIDE 40 MG PO TABS
40.0000 mg | ORAL_TABLET | Freq: Every day | ORAL | Status: DC
  Administered 2017-06-01 – 2017-06-02 (×2): 40 mg via ORAL
  Filled 2017-06-01 (×2): qty 1

## 2017-06-01 MED ORDER — SODIUM CHLORIDE 0.9 % IV SOLN: 250.0000 mL | INTRAVENOUS | Status: DC | PRN

## 2017-06-01 MED ORDER — HYDROCORTISONE 1 % EX CREA
TOPICAL_CREAM | CUTANEOUS | Status: DC | PRN
  Filled 2017-06-01: qty 28

## 2017-06-01 MED ORDER — MOVING RIGHT ALONG BOOK
Freq: Once | Status: AC
  Administered 2017-06-01: 09:00:00
  Filled 2017-06-01: qty 1

## 2017-06-01 MED ORDER — TRAMADOL HCL 50 MG PO TABS
50.0000 mg | ORAL_TABLET | ORAL | Status: DC | PRN
  Administered 2017-06-01 – 2017-06-04 (×4): 100 mg via ORAL
  Filled 2017-06-01 (×4): qty 2

## 2017-06-01 MED ORDER — BISACODYL 10 MG RE SUPP: 10.0000 mg | Freq: Every day | RECTAL | Status: DC | PRN

## 2017-06-01 MED ORDER — POTASSIUM CHLORIDE 10 MEQ/50ML IV SOLN
10.0000 meq | Freq: Once | INTRAVENOUS | Status: AC
  Administered 2017-06-01: 10 meq via INTRAVENOUS

## 2017-06-01 MED ORDER — ASPIRIN EC 325 MG PO TBEC
325.0000 mg | DELAYED_RELEASE_TABLET | Freq: Every day | ORAL | Status: DC
  Administered 2017-06-01 – 2017-06-02 (×2): 325 mg via ORAL
  Filled 2017-06-01 (×2): qty 1

## 2017-06-01 MED ORDER — AMIODARONE HCL 200 MG PO TABS: 200.0000 mg | ORAL_TABLET | Freq: Every day | ORAL | Status: DC

## 2017-06-01 NOTE — Progress Notes (Signed)
06/01/2017  1700 Received pt to room 4E-06 from 2H.  Pt is A&O, no C/O voiced.  Tele monitor applied and CCMD notified.  CHG wipe down completed.  Oriented to room, call light and bed.  Call bell in reach. Kathryne Hitch

## 2017-06-01 NOTE — Progress Notes (Addendum)
Patient ID: Jon Garner., male   DOB: 19-Nov-1965, 52 y.o.   MRN: [id number] TCTS DAILY ICU PROGRESS NOTE                   [address]            [place] [address]          [phone]   3 Days Post-Op Procedure(s) (LRB): CORONARY ARTERY BYPASS GRAFTING (CABG)  X 4 WITH OPEN HARVESTING OF LEFT AND RIGHT SAPHENOUS VEINS (N/A) TRANSESOPHAGEAL ECHOCARDIOGRAM (TEE) (N/A)  Total Length of Stay:  LOS: 5 days   Subjective: Patient up in chair ambulated short distances morning  Objective: Vital signs in last 24 hours: Temp:  [98.2 F (36.8 C)-98.7 F (37.1 C)] 98.7 F (37.1 C) (04/28 0749) Pulse Rate:  [71-94] 87 (04/28 0700) Cardiac Rhythm: Normal sinus rhythm (04/27 2000) Resp:  [12-28] 24 (04/28 0700) BP: (86-149)/(43-99) 149/92 (04/28 0700) SpO2:  [91 %-100 %] 95 % (04/28 0700) Weight:  [309 lb 12.8 oz (140.5 kg)] 309 lb 12.8 oz (140.5 kg) (04/28 0500)  Filed Weights   05/30/17 0500 05/31/17 0600 06/01/17 0500  Weight: (!) 305 lb (138.3 kg) (!) 311 lb 8.2 oz (141.3 kg) (!) 309 lb 12.8 oz (140.5 kg)    Weight change: -1 lb 11.4 oz (-0.776 kg)   Hemodynamic parameters for last 24 hours:    Intake/Output from previous day: 04/27 0701 - 04/28 0700 In: 203.4 [P.O.:150; I.V.:53.4] Out: 1480 [Urine:1400; Chest Tube:80]  Intake/Output this shift: No intake/output data recorded.  Current Meds: Scheduled Meds: . acetaminophen  1,000 mg Oral Q6H   Or  . acetaminophen (TYLENOL) oral liquid 160 mg/5 mL  1,000 mg Per Tube Q6H  . amiodarone  200 mg Oral Q12H   Followed by  . [START ON 06/07/2017] amiodarone  200 mg Oral Daily  . aspirin EC  325 mg Oral Daily   Or  . aspirin  324 mg Per Tube Daily  . atorvastatin  80 mg Oral q1800  . bisacodyl  10 mg Oral Daily   Or  . bisacodyl  10 mg Rectal Daily  . Chlorhexidine Gluconate Cloth  6 each Topical Daily  . docusate sodium  200 mg Oral Daily  . enoxaparin (LOVENOX) injection  40 mg Subcutaneous QHS    . insulin aspart  0-24 Units Subcutaneous Q4H  . insulin detemir  25 Units Subcutaneous BID  . mouth rinse  15 mL Mouth Rinse BID  . metoprolol tartrate  12.5 mg Oral BID   Or  . metoprolol tartrate  12.5 mg Per Tube BID  . mupirocin ointment   Nasal BID  . pantoprazole  40 mg Oral Daily  . sodium chloride flush  10-40 mL Intracatheter Q12H  . sodium chloride flush  3 mL Intravenous Q12H   Continuous Infusions: . sodium chloride    . amiodarone Stopped (05/31/17 0936)  . lactated ringers    . lactated ringers    . lactated ringers 10 mL/hr at 05/30/17 1900  . nitroGLYCERIN Stopped (05/29/17 2309)  . phenylephrine (NEO-SYNEPHRINE) Adult infusion    . potassium chloride     PRN Meds:.lactated ringers, metoprolol tartrate, morphine injection, ondansetron (ZOFRAN) IV, oxyCODONE, sodium chloride flush, sodium chloride flush, traMADol  General appearance: alert, cooperative, appears older than stated age and no distress Neurologic: intact Heart: regular rate and rhythm, S1, S2 normal, no murmur, click, rub or gallop Lungs: diminished breath sounds bibasilar Abdomen: Hypoactive  bowel sounds Extremities: extremities normal, atraumatic, no cyanosis or edema and Homans sign is negative, no sign of DVT Wound: Sternum stable  Lab Results: CBC: Recent Labs    05/31/17 0500 06/01/17 0515  WBC 14.6* 12.8*  HGB 11.4* 10.4*  HCT 35.2* 31.4*  PLT 178 182   BMET:  Recent Labs    05/31/17 0401 06/01/17 0515  NA 134* 136  K 3.8 3.5  CL 100* 100*  CO2 25 28  GLUCOSE 131* 92  BUN 15 17  CREATININE 1.53* 1.33*  CALCIUM 8.0* 7.8*    CMET: Lab Results  Component Value Date   WBC 12.8 (H) 06/01/2017   HGB 10.4 (L) 06/01/2017   HCT 31.4 (L) 06/01/2017   PLT 182 06/01/2017   GLUCOSE 92 06/01/2017   CHOL 135 05/27/2017   TRIG 140 05/27/2017   HDL 25 (L) 05/27/2017   LDLCALC 82 05/27/2017   ALT 32 05/31/2017   AST 40 05/31/2017   NA 136 06/01/2017   K 3.5 06/01/2017   CL  100 (L) 06/01/2017   CREATININE 1.33 (H) 06/01/2017   BUN 17 06/01/2017   CO2 28 06/01/2017   TSH 3.225 06/01/2017   INR 1.19 05/29/2017      PT/INR:  Recent Labs    05/29/17 1519  LABPROT 15.0  INR 1.19   Radiology: Dg Chest Port 1 View  Result Date: 06/01/2017 CLINICAL DATA:  Chest tube. EXAM: PORTABLE CHEST 1 VIEW COMPARISON:  May 31, 2017 FINDINGS: A right-sided sheath remains. Cardiomegaly. Opacity in the left base is stable. No pneumothorax. No chest tube identified. No other interval changes. IMPRESSION: 1. Stable support apparatus. 2. Stable opacity in the left base. Recommend follow-up to resolution. 3. No other acute abnormalities. Electronically Signed   By: Gerome Sam III M.D   On: 06/01/2017 08:07     Assessment/Plan: S/P Procedure(s) (LRB): CORONARY ARTERY BYPASS GRAFTING (CABG)  X 4 WITH OPEN HARVESTING OF LEFT AND RIGHT SAPHENOUS VEINS (N/A) TRANSESOPHAGEAL ECHOCARDIOGRAM (TEE) (N/A) Mobilize Diuresis Plan for transfer to step-down: see transfer orders Replace kcl Holding sinus D/c central line Cr to 1.33 improved  DC Foley    Delight Ovens 06/01/2017 8:16 AM

## 2017-06-02 ENCOUNTER — Inpatient Hospital Stay (HOSPITAL_COMMUNITY): Payer: Self-pay

## 2017-06-02 LAB — GLUCOSE, CAPILLARY
GLUCOSE-CAPILLARY: 132 mg/dL — AB (ref 65–99)
Glucose-Capillary: 174 mg/dL — ABNORMAL HIGH (ref 65–99)
Glucose-Capillary: 121 mg/dL — ABNORMAL HIGH (ref 65–99)
Glucose-Capillary: 79 mg/dL (ref 65–99)
Glucose-Capillary: 93 mg/dL (ref 65–99)

## 2017-06-02 LAB — CBC
HCT: 32.4 % — ABNORMAL LOW (ref 39.0–52.0)
Hemoglobin: 10.5 g/dL — ABNORMAL LOW (ref 13.0–17.0)
MCH: 28.8 pg (ref 26.0–34.0)
MCHC: 32.4 g/dL (ref 30.0–36.0)
MCV: 88.8 fL (ref 78.0–100.0)
Platelets: 215 10*3/uL (ref 150–400)
RBC: 3.65 MIL/uL — ABNORMAL LOW (ref 4.22–5.81)
RDW: 14.2 % (ref 11.5–15.5)
WBC: 10.4 10*3/uL (ref 4.0–10.5)

## 2017-06-02 LAB — BPAM RBC
Blood Product Expiration Date: 201905222359
Blood Product Expiration Date: 201905222359
Blood Product Expiration Date: 201905222359
Blood Product Expiration Date: 201905222359
ISSUE DATE / TIME: 201904250843
ISSUE DATE / TIME: 201904250843
ISSUE DATE / TIME: 201904250843
ISSUE DATE / TIME: 201904250843
Unit Type and Rh: 5100
Unit Type and Rh: 5100
Unit Type and Rh: 5100
Unit Type and Rh: 5100

## 2017-06-02 LAB — TYPE AND SCREEN
ABO/RH(D): O POS
Antibody Screen: NEGATIVE
Unit division: 0
Unit division: 0
Unit division: 0
Unit division: 0

## 2017-06-02 LAB — BASIC METABOLIC PANEL
Anion gap: 9 (ref 5–15)
BUN: 16 mg/dL (ref 6–20)
CO2: 26 mmol/L (ref 22–32)
Calcium: 7.9 mg/dL — ABNORMAL LOW (ref 8.9–10.3)
Chloride: 101 mmol/L (ref 101–111)
Creatinine, Ser: 1.17 mg/dL (ref 0.61–1.24)
GFR calc Af Amer: >60 mL/min (ref >60)
GFR calc non Af Amer: >60 mL/min (ref >60)
Glucose, Bld: 123 mg/dL — ABNORMAL HIGH (ref 65–99)
Potassium: 3.3 mmol/L — ABNORMAL LOW (ref 3.5–5.1)
Sodium: 136 mmol/L (ref 135–145)

## 2017-06-02 MED ORDER — METOPROLOL TARTRATE 25 MG PO TABS
25.0000 mg | ORAL_TABLET | Freq: Two times a day (BID) | ORAL | Status: DC
  Administered 2017-06-02 – 2017-06-04 (×4): 25 mg via ORAL
  Filled 2017-06-02 (×4): qty 1

## 2017-06-02 MED ORDER — APIXABAN 5 MG PO TABS
5.0000 mg | ORAL_TABLET | Freq: Two times a day (BID) | ORAL | Status: DC
  Administered 2017-06-02 – 2017-06-04 (×4): 5 mg via ORAL
  Filled 2017-06-02 (×4): qty 1

## 2017-06-02 MED ORDER — ASPIRIN EC 81 MG PO TBEC
81.0000 mg | DELAYED_RELEASE_TABLET | Freq: Every day | ORAL | Status: DC
  Administered 2017-06-02 – 2017-06-04 (×3): 81 mg via ORAL
  Filled 2017-06-02 (×3): qty 1

## 2017-06-02 MED ORDER — POTASSIUM CHLORIDE CRYS ER 20 MEQ PO TBCR
40.0000 meq | EXTENDED_RELEASE_TABLET | Freq: Three times a day (TID) | ORAL | Status: DC
  Administered 2017-06-02 (×2): 40 meq via ORAL
  Filled 2017-06-02 (×2): qty 2

## 2017-06-02 MED FILL — Electrolyte-R (PH 7.4) Solution: INTRAVENOUS | Qty: 3000 | Status: AC

## 2017-06-02 MED FILL — Sodium Bicarbonate IV Soln 8.4%: INTRAVENOUS | Qty: 50 | Status: AC

## 2017-06-02 MED FILL — Sodium Chloride IV Soln 0.9%: INTRAVENOUS | Qty: 2000 | Status: AC

## 2017-06-02 MED FILL — Lidocaine HCl(Cardiac) IV PF Soln Pref Syr 100 MG/5ML (2%): INTRAVENOUS | Qty: 5 | Status: AC

## 2017-06-02 MED FILL — Heparin Sodium (Porcine) Inj 1000 Unit/ML: INTRAMUSCULAR | Qty: 20 | Status: AC

## 2017-06-02 MED FILL — Mannitol IV Soln 20%: INTRAVENOUS | Qty: 500 | Status: AC

## 2017-06-02 MED FILL — Heparin Sodium (Porcine) Inj 1000 Unit/ML: INTRAMUSCULAR | Qty: 30 | Status: AC

## 2017-06-02 NOTE — Progress Notes (Signed)
Physical Therapy Treatment Patient Details Name: Jon Garner. MRN: [id number] DOB: 1966-01-01 Today's Date: 06/02/2017    History of Present Illness Pt adm with chest pain and found to have NSTEMI. 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 PMH - PVD, ICA occlusion, obesity, knee surgery    PT Comments    Pt making steady progress. Remains dyspneic with activity and mobility limited by chronic knee pain.   Follow Up Recommendations  Other (comment);Home health PT(possibly cardiac rehab phase II)     Equipment Recommendations  Other (comment)(rollator)    Recommendations for Other Services       Precautions / Restrictions Precautions Precautions: Sternal Precaution Booklet Issued: Yes (comment)    Mobility  Bed Mobility Overal bed mobility: Needs Assistance Bed Mobility: Supine to Sit     Supine to sit: Supervision;HOB elevated     General bed mobility comments: Incr time and effort  Transfers Overall transfer level: Needs assistance Equipment used: 4-wheeled walker Transfers: Sit to/from Stand Sit to Stand: Min guard         General transfer comment: Assist for lines and safety. Pt placed hands on knees to come to stand. Followed sternal precautions without cuing  Ambulation/Gait Ambulation/Gait assistance: Supervision Ambulation Distance (Feet): 225 Feet Assistive device: 4-wheeled walker Gait Pattern/deviations: Step-through pattern;Decreased stride length;Trunk flexed Gait velocity: decr Gait velocity interpretation: 1.31 - 2.62 ft/sec, indicative of limited community ambulator General Gait Details: Assist for safety and lines. Pt with knees in flexion due to prior knee surgery. Pt required 5 standing rest breaks with dyspnea 3/4. Pt amb on RA. Unable to get SpO2 reading until after sitting x 2-3 minutes and then with reading 95%. Verbal cues to stand more erect.   Stairs             Wheelchair Mobility    Modified Rankin (Stroke Patients Only)        Balance Overall balance assessment: Mild deficits observed, not formally tested                                          Cognition Arousal/Alertness: Awake/alert Behavior During Therapy: WFL for tasks assessed/performed Overall Cognitive Status: Within Functional Limits for tasks assessed                                        Exercises      General Comments        Pertinent Vitals/Pain Pain Assessment: Faces Faces Pain Scale: Hurts little more Pain Location: knees Pain Descriptors / Indicators: Grimacing;Guarding Pain Intervention(s): Limited activity within patient's tolerance;Monitored during session    Home Living                      Prior Function            PT Goals (current goals can now be found in the care plan section) Progress towards PT goals: Progressing toward goals    Frequency    Min 3X/week      PT Plan Discharge plan needs to be updated    Co-evaluation              AM-PAC PT "6 Clicks" Daily Activity  Outcome Measure  Difficulty turning over in bed (including adjusting bedclothes, sheets and blankets)?: A Little  Difficulty moving from lying on back to sitting on the side of the bed? : A Little Difficulty sitting down on and standing up from a chair with arms (e.g., wheelchair, bedside commode, etc,.)?: A Little Help needed moving to and from a bed to chair (including a wheelchair)?: A Little Help needed walking in hospital room?: A Little Help needed climbing 3-5 steps with a railing? : A Little 6 Click Score: 18    End of Session   Activity Tolerance: Patient limited by fatigue Patient left: in chair;with call bell/phone within reach Nurse Communication: Mobility status PT Visit Diagnosis: Other abnormalities of gait and mobility (R26.89);Difficulty in walking, not elsewhere classified (R26.2)     Time: 9604-5409 PT Time Calculation (min) (ACUTE ONLY): 26 min  Charges:   $Gait Training: 23-37 mins                    G Codes:       [facility] PT [phone]    [name] 06/02/2017, 10:08 AM

## 2017-06-02 NOTE — Progress Notes (Addendum)
      [address]       [place] [address]             [phone]      4 Days Post-Op Procedure(s) (LRB): CORONARY ARTERY BYPASS GRAFTING (CABG)  X 4 WITH OPEN HARVESTING OF LEFT AND RIGHT SAPHENOUS VEINS (N/A) TRANSESOPHAGEAL ECHOCARDIOGRAM (TEE) (N/A) Subjective: Snoring when I entered the room. Feels okay today.   Objective: Vital signs in last 24 hours: Temp:  [97.8 F (36.6 C)-99 F (37.2 C)] 98.1 F (36.7 C) (04/29 0802) Pulse Rate:  [72-135] 80 (04/29 0802) Cardiac Rhythm: Normal sinus rhythm (04/29 0200) Resp:  [12-25] 18 (04/29 0802) BP: (101-151)/(51-103) 128/86 (04/29 0802) SpO2:  [92 %-100 %] 94 % (04/29 0802) FiO2 (%):  [24 %] 24 % (04/28 0831) Weight:  [306 lb 12.8 oz (139.2 kg)] 306 lb 12.8 oz (139.2 kg) (04/29 0634)     Intake/Output from previous day: 04/28 0701 - 04/29 0700 In: 620 [P.O.:460; I.V.:10; IV Piggyback:150] Out: 886 [Urine:885; Stool:1] Intake/Output this shift: No intake/output data recorded.  General appearance: alert, cooperative and no distress Heart: regular rate and rhythm, S1, S2 normal, no murmur, click, rub or gallop Lungs: clear to auscultation bilaterally Abdomen: soft, non-tender; bowel sounds normal; no masses,  no organomegaly Extremities: 2-3+ pitting pedal edema Wound: clean and dry sternal incision, Staples in place from vein harvest  Lab Results: Recent Labs    06/01/17 0515 06/02/17 0258  WBC 12.8* 10.4  HGB 10.4* 10.5*  HCT 31.4* 32.4*  PLT 182 215   BMET:  Recent Labs    06/01/17 0515 06/02/17 0258  NA 136 136  K 3.5 3.3*  CL 100* 101  CO2 28 26  GLUCOSE 92 123*  BUN 17 16  CREATININE 1.33* 1.17  CALCIUM 7.8* 7.9*    PT/INR: No results for input(s): LABPROT, INR in the last 72 hours. ABG    Component Value Date/Time   PHART 7.357 05/30/2017 0107   HCO3 21.2 05/30/2017 0107   TCO2 23 05/30/2017 1616   ACIDBASEDEF 3.0 (H) 05/30/2017 0107   O2SAT 97.0 05/30/2017 0107   CBG  (last 3)  Recent Labs    06/01/17 1700 06/01/17 2232 06/02/17 0626  GLUCAP 105* 76 121*    Assessment/Plan: S/P Procedure(s) (LRB): CORONARY ARTERY BYPASS GRAFTING (CABG)  X 4 WITH OPEN HARVESTING OF LEFT AND RIGHT SAPHENOUS VEINS (N/A) TRANSESOPHAGEAL ECHOCARDIOGRAM (TEE) (N/A)  1. CV-BP well controlled. NSR in the 80s. Hx of afib, on Amio PO and Lopressor, continue Lipitor and ASA. Discontinue EPW 2. Pulm-CXR small bilateral pleural effusions, left > right. Tolerating room air.  3. Renal-creatinine 1.17, hypokalemia-replacing. Weight continuing to trend down. He remains about 7 lbs fluid overloaded.  4. H and H 10.5/32.4, platelets trending up 5. Endo-blood glucose well controlled.   Plan: Needs more diuresis. Mobilize today around the unit. Maintaining NSR therefore discontinue EPW. Encouraged incentive spirometer.     LOS: 6 days    Sharlene Dory 06/02/2017 Patient seen and examined, agree with above CXR looks good Incision healing well K=3.3- supplement PO CBG well controlled- dc levemir and follow Had brief run of SVT last night, suspect a fib, had flutter intraop- I think anticoagulation warranted at least short term. Will dc pacing wires today. Start Eliquis this evening. Continue amiodarone  Salvatore Decent. Dorris Fetch, MD [facility] [phone]

## 2017-06-02 NOTE — Progress Notes (Signed)
CARDIAC REHAB PHASE I   PRE:  Rate/Rhythm: 85 SR  BP:  Supine: 135/58  Sitting:   Standing:    SaO2: 97%RA  MODE:  Ambulation: 225 ft   POST:  Rate/Rhythm: 103 ST  BP:  Supine:   Sitting: 159/77  Standing:    SaO2: 92-95%RA 1346-1422 Pt walked 225 ft on RA with rolling walker and gait belt use. Needed some assistance getting out of bed. Pt stated difficulty voiding due to swelling. Had him stand and try to void in [facility] before going back to bed after walk. Getting ready to have pacing wires removed. Assisted back to bed.   Luetta Nutting, RN BSN  06/02/2017 2:18 PM

## 2017-06-02 NOTE — Discharge Instructions (Addendum)
Coronary Artery Bypass Grafting, Care After ° ° °This sheet gives you information about how to care for yourself after your procedure. Your health care provider may also give you more specific instructions. If you have problems or questions, contact your health care provider. °What can I expect after the procedure? °After the procedure, it is common to have: °· Nausea and a lack of appetite. °· Constipation. °· Weakness and fatigue. °· Depression or irritability. °· Pain or discomfort in your incision areas. ° °Follow these instructions at home: °Medicines °· Take over-the-counter and prescription medicines only as told by your health care provider. Do not stop taking medicines or start any new medicines without approval from your health care provider. °· If you were prescribed an antibiotic medicine, take it as told by your health care provider. Do not stop taking the antibiotic even if you start to feel better. °· Do not drive or use heavy machinery while taking prescription pain medicine. °Incision care °· Follow instructions from your health care provider about how to take care of your incisions. Make sure you: °? Wash your hands with soap and water before you change your bandage (dressing). If soap and water are not available, use hand sanitizer. °? Change your dressing as told by your health care provider. °? Leave stitches (sutures), skin glue, or adhesive strips in place. These skin closures may need to stay in place for 2 weeks or longer. If adhesive strip edges start to loosen and curl up, you may trim the loose edges. Do not remove adhesive strips completely unless your health care provider tells you to do that. °· Keep incision areas clean, dry, and protected. °· Check your incision areas every day for signs of infection. Check for: °? More redness, swelling, or pain. °? More fluid or blood. °? Warmth. °? Pus or a bad smell. °· If incisions were made in your legs: °? Avoid crossing your legs. °? Avoid  sitting for long periods of time. Change positions every 30 minutes. °? Raise (elevate) your legs when you are sitting. °Bathing °· Do not take baths, swim, or use a hot tub until your health care provider approves. °· Only take sponge baths. Pat the incisions dry. Do not rub incisions with a washcloth or towel. °· Ask your health care provider when you can shower. °Eating and drinking °· Eat foods that are high in fiber, such as raw fruits and vegetables, whole grains, beans, and nuts. Meats should be lean cut. Avoid canned, processed, and fried foods. This can help prevent constipation and is a recommended part of a heart-healthy diet. °· Drink enough fluid to keep your urine clear or pale yellow. °· Limit alcohol intake to no more than 1 drink a day for nonpregnant women and 2 drinks a day for men. One drink equals 12 oz of beer, 5 oz of wine, or 1½ oz of hard liquor. °Activity °· Rest and limit your activity as told by your health care provider. You may be instructed to: °? Stop any activity right away if you have chest pain, shortness of breath, irregular heartbeats, or dizziness. Get help right away if you have any of these symptoms. °? Move around frequently for short periods or take short walks as directed by your health care provider. Gradually increase your activities. You may need physical therapy or cardiac rehabilitation to help strengthen your muscles and build your endurance. °? Avoid lifting, pushing, or pulling anything that is heavier than 10 lb (4.5 kg)   for at least 6 weeks or as told by your health care provider. °· Do not drive until your health care provider approves. °· Ask your health care provider when you may return to work. °· Ask your health care provider when you may resume sexual activity. °General instructions °· Do not use any products that contain nicotine or tobacco, such as cigarettes and e-cigarettes. If you need help quitting, ask your health care provider. °· Take 2-3 deep  breaths every few hours during the day, while you recover. This helps expand your lungs and prevent complications like pneumonia after surgery. °· If you were given a device called an incentive spirometer, use it several times a day to practice deep breathing. Support your chest with a pillow or your arms when you take deep breaths or cough. °· Wear compression stockings as told by your health care provider. These stockings help to prevent blood clots and reduce swelling in your legs. °· Weigh yourself every day. This helps identify if your body is holding (retaining) fluid that may make your heart and lungs work harder. °· Keep all follow-up visits as told by your health care provider. This is important. °Contact a health care provider if: °· You have more redness, swelling, or pain around any incision. °· You have more fluid or blood coming from any incision. °· Any incision feels warm to the touch. °· You have pus or a bad smell coming from any incision °· You have a fever. °· You have swelling in your ankles or legs. °· You have pain in your legs. °· You gain 2 lb (0.9 kg) or more a day. °· You are nauseous or you vomit. °· You have diarrhea. °Get help right away if: °· You have chest pain that spreads to your jaw or arms. °· You are short of breath. °· You have a fast or irregular heartbeat. °· You notice a "clicking" in your breastbone (sternum) when you move. °· You have numbness or weakness in your arms or legs. °· You feel dizzy or light-headed. °Summary °· After the procedure, it is common to have pain or discomfort in the incision areas. °· Do not take baths, swim, or use a hot tub until your health care provider approves. °· Gradually increase your activities. You may need physical therapy or cardiac rehabilitation to help strengthen your muscles and build your endurance. °· Weigh yourself every day. This helps identify if your body is holding (retaining) fluid that may make your heart and lungs work  harder. °This information is not intended to replace advice given to you by your health care provider. Make sure you discuss any questions you have with your health care provider. °Document Released: 08/10/2004 Document Revised: 12/11/2015 Document Reviewed: 12/11/2015 °Elsevier Interactive Patient Education © 2018 [employer] °Information on my medicine - ELIQUIS® (apixaban) ° °This medication education was reviewed with me or my healthcare representative as part of my discharge preparation. ° °Why was Eliquis® prescribed for you? °Eliquis® was prescribed for you to reduce the risk of a blood clot forming that can cause a stroke if you have a medical condition called atrial fibrillation (a type of irregular heartbeat). ° °What do You need to know about Eliquis® ? °Take your Eliquis® TWICE DAILY - one tablet in the morning and one tablet in the evening with or without food. If you have difficulty swallowing the tablet whole please discuss with your pharmacist how to take the medication safely. ° °Take Eliquis® exactly as   prescribed by your doctor and DO NOT stop taking Eliquis® without talking to the doctor who prescribed the medication.  Stopping may increase your risk of developing a stroke.  Refill your prescription before you run out. ° °After discharge, you should have regular check-up appointments with your healthcare provider that is prescribing your Eliquis®.  In the future your dose may need to be changed if your kidney function or weight changes by a significant amount or as you get older. ° °What do you do if you miss a dose? °If you miss a dose, take it as soon as you remember on the same day and resume taking twice daily.  Do not take more than one dose of ELIQUIS at the same time to make up a missed dose. ° °Important Safety Information °A possible side effect of Eliquis® is bleeding. You should call your healthcare provider right away if you experience any of the following: °? Bleeding from an  injury or your nose that does not stop. °? Unusual colored urine (red or dark brown) or unusual colored stools (red or black). °? Unusual bruising for unknown reasons. °? A serious fall or if you hit your head (even if there is no bleeding). ° °Some medicines may interact with Eliquis® and might increase your risk of bleeding or clotting while on Eliquis®. To help avoid this, consult your healthcare provider or pharmacist prior to using any new prescription or non-prescription medications, including herbals, vitamins, non-steroidal anti-inflammatory drugs (NSAIDs) and supplements. ° °This website has more information on Eliquis® (apixaban): http://www.eliquis.com/eliquis/home ° °

## 2017-06-02 NOTE — Progress Notes (Signed)
ANTICOAGULATION CONSULT NOTE  Pharmacy Consult for apixaban Indication: atrial fibrillation  Labs: Recent Labs    05/31/17 0401 05/31/17 0500 06/01/17 0515 06/02/17 0258  HGB  --  11.4* 10.4* 10.5*  HCT  --  35.2* 31.4* 32.4*  PLT  --  178 182 215  CREATININE 1.53*  --  1.33* 1.17    Assessment: 51 yom s/p CABGx4 this admit with suspected afib, aflutter intra-op. Pharmacy consulted to start apixaban with 1st dose tonight per CVTS. To d/c pacing wires today. Hx of afib noted but not on anticoagulation PTA. Previously on Lovenox prophylaxis dosing - last dose 4/28 PM. Hg 10.5, plt wnl - stable. SCr down to 1.17. No bleed documented.  Goal of Therapy:  Stroke prevention Monitor platelets by anticoagulation protocol: Yes   Plan:  D/c Lovenox Start apixaban 5mg  PO BID with tonight's dose per CVTS request Monitor CBC, s/sx bleeding  Babs Bertin, PharmD, BCPS Clinical Pharmacist 06/02/2017 9:47 AM

## 2017-06-03 LAB — GLUCOSE, CAPILLARY: GLUCOSE-CAPILLARY: 92 mg/dL (ref 65–99)

## 2017-06-03 MED ORDER — LISINOPRIL 5 MG PO TABS: 5.0000 mg | ORAL_TABLET | Freq: Every day | ORAL | Status: DC

## 2017-06-03 MED ORDER — FUROSEMIDE 40 MG PO TABS
40.0000 mg | ORAL_TABLET | Freq: Two times a day (BID) | ORAL | Status: DC
  Administered 2017-06-03 – 2017-06-04 (×3): 40 mg via ORAL
  Filled 2017-06-03 (×3): qty 1

## 2017-06-03 MED ORDER — POTASSIUM CHLORIDE CRYS ER 20 MEQ PO TBCR
40.0000 meq | EXTENDED_RELEASE_TABLET | Freq: Two times a day (BID) | ORAL | Status: DC
  Administered 2017-06-03 (×2): 40 meq via ORAL
  Filled 2017-06-03 (×2): qty 2

## 2017-06-03 MED ORDER — LISINOPRIL 10 MG PO TABS
20.0000 mg | ORAL_TABLET | Freq: Every day | ORAL | Status: DC
  Administered 2017-06-03 – 2017-06-04 (×2): 20 mg via ORAL
  Filled 2017-06-03 (×2): qty 2

## 2017-06-03 NOTE — Discharge Summary (Addendum)
[address]       [place] [address]             [phone]      Physician Discharge Summary  Patient ID: Jon Garner. MRN: [id number] DOB/AGE: 02-04-1966 52 y.o.  Admit date: 05/27/2017 Discharge date: 06/04/2017  Admission Diagnoses: Patient Active Problem List   Diagnosis Date Noted  . NSTEMI (non-ST elevated myocardial infarction) (HCC) 05/27/2017  . Malignant hypertension 05/25/2017    Discharge Diagnoses:  Active Problems:   NSTEMI (non-ST elevated myocardial infarction) (HCC)   S/P CABG x 4   Discharged Condition: good  HPI:  52 yo man with a past history of hypertension, pseudoxanthoma elasticum, stroke at age 89, morbid obesity, and gastroesophageal reflux.  He has no known history of coronary disease but did go through a cardiac work-up in 2015 for chest pain, shortness of breath and diaphoresis.  Stress testing was normal at that time.  He was diagnosed with reflux and started on Prilosec and his symptoms did improve for a time.  He continued to have intermittent exertional dyspnea and diaphoresis.  He has been having more that recently and then over the past 3 to 4 weeks has been having chest pain associated with that.  On Sunday he had a prolonged spell where he developed substernal chest pressure.  He took an aspirin and his Zantac but did not experience any relief.  He became diaphoretic and short of breath and went to the emergency room.  His ECG showed inferolateral ST depression his initial troponin was 0.04, but rose to 9.17.  CTA of the chest was negative for pulmonary embolus.  He underwent cardiac catheterization which revealed severe three-vessel coronary disease.  He was transferred to [facility] for management.  He currently is not having any pain shortness of breath or diaphoresis.    Hospital Course:   On May 29, 2017 [name] underwent a coronary to bypass grafting x4 with Dr. Dorris Fetch.  He tolerated procedure  well and was transferred to the surgical ICU for continued care.  He was extubated in a timely manner.  Postop day 1 he was doing well with minimal pain.  We discontinued his Swan-Ganz catheter and arterial line.  We initiated metoprolol, atorvastatin, and aspirin.  His renal function remained stable.  We started Lovenox for DVT prophylaxis.  We started cardiac rehab.  Postop day 2 he was maintaining normal sinus rhythm.  We began to mobilize the patient and we initiated a diuretic regimen fluid overload.  He did have a mild elevation in creatinine of 1.5.  He converted to normal sinus rhythm on Cordarone after a brief episode of atrial fibrillation.  Postop day 3 his renal function began to return to baseline.  We discontinued his central line.  He was maintained a normal sinus rhythm on p.o. amiodarone.  We discontinued his Foley catheter.  He was stable to  transfer to the telemetry unit for continued care.  Postop day 4 he had a brief run of SVT.  He also had a episode of atrial flutter Intra-Op.  We initiated Eliquis for anticoagulation.  Discontinued his epicardial pacing wires.  He did have some small bilateral pleural effusions left greater than right on chest x-ray.  His creatinine continued to trend down.  Postop day 5 we initiated an ACE inhibitor for hypertension.  We continued oral Lasix for fluid overload.  He continued to improve Daily and we were able  to wean him off his oxygen. Today, he is ambulating with an assist device assistance, tolerating room air, his incisions are healing well and he is ready for discharge home.   Consults: None  Significant Diagnostic Studies: CLINICAL DATA:  Shortness of breath status post coronary artery bypass graft.  EXAM: CHEST - 2 VIEW  COMPARISON:  Radiograph of June 01, 2017.  FINDINGS: Stable cardiomegaly. Status post coronary artery bypass graft. No pneumothorax is noted. Stable bibasilar subsegmental atelectasis, left greater than right.  Bony thorax is unremarkable. Small bilateral pleural effusions are noted.  IMPRESSION: Stable bibasilar subsegmental atelectasis is noted with small bilateral pleural effusions, left greater than right.   Electronically Signed   By: Lupita Raider, M.D.   On: 06/02/2017 07:42  Treatments:   NAME:  ZOHAIB, [name] NO.:  [id number]  MEDICAL RECORD NO.:  [id number]  LOCATION:  [id number]                        FACILITY:  [facility]  PHYSICIAN:  Salvatore Decent. Dorris Fetch, M.D.DATE OF BIRTH:  Dec 17, 1965  DATE OF PROCEDURE:  05/29/2017 DATE OF DISCHARGE:                              OPERATIVE REPORT   PREOPERATIVE DIAGNOSIS:  Severe three-vessel disease, status post non-ST- elevation myocardial infarction.  POSTOPERATIVE DIAGNOSIS:  Severe three-vessel disease, status post non- ST-elevation myocardial infarction.  PROCEDURES:  Median sternotomy, extracorporeal circulation, coronary artery bypass grafting x4 (left internal mammary artery to left anterior descending, saphenous vein graft to first diagonal, saphenous vein graft to posterior descending, saphenous vein graft to obtuse marginal 2), open vein harvest.  SURGEON:  Salvatore Decent. Dorris Fetch, M.D.  ASSISTANT:  Rowe Clack, P.A.-C.  ANESTHESIA:  General.  FINDINGS:  Vein difficult to isolate.  Could not be harvested endoscopically.  Vein of good quality once identified.  Mammary good quality.  Morbid obesity, cardiomegaly, mild mitral regurgitation by TEE, preserved left ventricular systolic function.    Discharge Exam: Blood pressure (!) 159/69, pulse 93, temperature 97.8 F (36.6 C), temperature source Oral, resp. rate 19, height 5\' 8"  (1.727 m), weight (!) 300 lb 1.6 oz (136.1 kg), SpO2 98 %.   General appearance: alert, cooperative and no distress Heart: regular rate and rhythm, S1, S2 normal, no murmur, click, rub or gallop Lungs: clear to auscultation bilaterally Abdomen: soft,  non-tender; bowel sounds normal; no masses,  no organomegaly Extremities: 1-2+ pitting pedal edema Wound: sternal incision and EVH sites on bilateral legs are clean and dry. Staples in place.    Disposition:   Discharge Instructions    Amb Referral to Cardiac Rehabilitation   Complete by:  As directed    Diagnosis:   CABG NSTEMI     CABG X ___:  4     Allergies as of 06/04/2017      Reactions   Penicillins Other (See Comments)   Has patient had a PCN reaction causing immediate rash, facial/tongue/throat swelling, SOB or lightheadedness with hypotension: Unknown Has patient had a PCN reaction causing severe rash involving mucus membranes or skin necrosis: Unknown Has patient had a PCN reaction that required hospitalization: Unknown Has patient had a PCN reaction occurring within the last 10 years: Unknown If all of the above answers are "NO", then may proceed with Cephalosporin use.  Medication List    TAKE these medications   acetaminophen 325 MG tablet Commonly known as:  TYLENOL Take 2 tablets (650 mg total) by mouth every 6 (six) hours as needed for mild pain.   amiodarone 200 MG tablet Commonly known as:  PACERONE Please take 1 tab (200mg ) twice a day for 3 more days then take 1 tab (200mg ) once a day until we see you in follow-up.   apixaban 5 MG Tabs tablet Commonly known as:  ELIQUIS Take 1 tablet (5 mg total) by mouth 2 (two) times daily.   aspirin EC 81 MG tablet Take 81 mg by mouth once.   atorvastatin 80 MG tablet Commonly known as:  LIPITOR Take 1 tablet (80 mg total) by mouth daily at 6 PM.   cephALEXin 500 MG capsule Commonly known as:  KEFLEX Take 1 capsule (500 mg total) by mouth every 8 (eight) hours for 7 doses.   furosemide 40 MG tablet Commonly known as:  LASIX Take 1 tablet (40 mg total) by mouth daily.   lisinopril 20 MG tablet Commonly known as:  PRINIVIL,ZESTRIL Take 1 tablet (20 mg total) by mouth daily.   metoprolol tartrate 25  MG tablet Commonly known as:  LOPRESSOR Take 1 tablet (25 mg total) by mouth 2 (two) times daily.   omeprazole 20 MG capsule Commonly known as:  PRILOSEC Take 20 mg by mouth daily as needed (acid reflux.).   oxyCODONE 5 MG immediate release tablet Commonly known as:  Oxy IR/ROXICODONE Take 1 tablet (5 mg total) by mouth every 6 (six) hours as needed for severe pain.   potassium chloride SA 20 MEQ tablet Commonly known as:  K-DUR,KLOR-CON Take 1 tablet (20 mEq total) by mouth daily.   ranitidine 150 MG tablet Commonly known as:  ZANTAC Take 150 mg by mouth 2 (two) times daily.            Durable Medical Equipment  (From admission, onward)        Start     Ordered   06/04/17 1049  For home use only DME 4 wheeled rolling walker with seat  Once    Comments:  S/p CABGx4  Question:  Patient needs a walker to treat with the following condition  Answer:  Weakness   06/04/17 1048     Follow-up Information    Antonieta Iba, MD. Call in 1 day(s).   Specialty:  Cardiology Contact information: [address] [place] [address] [place] [address] [phone]        Loreli Slot, MD Follow up.   Specialty:  Cardiothoracic Surgery Why:  Your appointment is on May 28th at 4:00pm. Please arrive at 3:30pm for a CXR located at [facility] which is on the first floor of our building.  Contact information: [address] [employer] [address] [place] [address] [phone]        Sondra Barges, PA-C Follow up.   Specialties:  Physician Assistant, Cardiology, Radiology Why:  Eula Listen, PA-C 5/16 @10am  ([facility])  Contact information: [address] [place] [address] [phone]        [facility] - Dme Follow up.   Why:  rolling walker arranged- to be delivered to room prior to discharge Contact information: [address] [place] [address] [phone]        [facility], [facility] Follow up.    Specialty:  Home Health Services Why:  HHPT arranged- they  will assess for charity care eligibilty and f/u with pt if approved for services Contact information: [address] [place] [address] [phone]        nursing appointment Follow up.   Why:  Your staple removal appointment is at 10:00am on May 9th.  Contact information: Dr. Sunday Corn office         The patient has been discharged on:   1.Beta Blocker:  Yes [ x  ]                              No   [   ]                              If No, reason:  2.Ace Inhibitor/ARB: Yes [ x  ]                                     No  [    ]                                     If No, reason:  3.Statin:   Yes [x   ]                  No  [   ]                  If No, reason:  4.Ecasa:  Yes  [ x  ]                  No   [   ]                  If No, reason:   Signed: Sharlene Dory 06/04/2017, 1:08 PM

## 2017-06-03 NOTE — Progress Notes (Addendum)
      [address]       [place] [address]             [phone]      5 Days Post-Op Procedure(s) (LRB): CORONARY ARTERY BYPASS GRAFTING (CABG)  X 4 WITH OPEN HARVESTING OF LEFT AND RIGHT SAPHENOUS VEINS (N/A) TRANSESOPHAGEAL ECHOCARDIOGRAM (TEE) (N/A) Subjective: He feels okay this morning. His mobility is still an issue but this is mostly due to previous knee surgery.   Objective: Vital signs in last 24 hours: Temp:  [98.1 F (36.7 C)-98.8 F (37.1 C)] 98.1 F (36.7 C) (04/30 0524) Pulse Rate:  [80-100] 100 (04/30 0524) Cardiac Rhythm: Normal sinus rhythm (04/29 1900) Resp:  [18-29] 20 (04/30 0524) BP: (118-157)/(60-117) 154/77 (04/30 0524) SpO2:  [94 %-98 %] 96 % (04/30 0524) Weight:  [304 lb 6.4 oz (138.1 kg)] 304 lb 6.4 oz (138.1 kg) (04/30 0524)     Intake/Output from previous day: 04/29 0701 - 04/30 0700 In: 480 [P.O.:480] Out: 500 [Urine:500] Intake/Output this shift: No intake/output data recorded.  General appearance: alert, cooperative and no distress Heart: regular rate and rhythm, S1, S2 normal, no murmur, click, rub or gallop Lungs: clear to auscultation bilaterally Abdomen: soft, non-tender; bowel sounds normal; no masses,  no organomegaly Extremities: 1-2+ pitting pedal edema Wound: clean and dry sterotomy incision. Multiple bilateral staple sites on legs c/d/i  Lab Results: Recent Labs    06/01/17 0515 06/02/17 0258  WBC 12.8* 10.4  HGB 10.4* 10.5*  HCT 31.4* 32.4*  PLT 182 215   BMET:  Recent Labs    06/01/17 0515 06/02/17 0258  NA 136 136  K 3.5 3.3*  CL 100* 101  CO2 28 26  GLUCOSE 92 123*  BUN 17 16  CREATININE 1.33* 1.17  CALCIUM 7.8* 7.9*    PT/INR: No results for input(s): LABPROT, INR in the last 72 hours. ABG    Component Value Date/Time   PHART 7.357 05/30/2017 0107   HCO3 21.2 05/30/2017 0107   TCO2 23 05/30/2017 1616   ACIDBASEDEF 3.0 (H) 05/30/2017 0107   O2SAT 97.0 05/30/2017 0107   CBG  (last 3)  Recent Labs    06/02/17 1713 06/02/17 2035 06/03/17 0619  GLUCAP 79 93 92    Assessment/Plan: S/P Procedure(s) (LRB): CORONARY ARTERY BYPASS GRAFTING (CABG)  X 4 WITH OPEN HARVESTING OF LEFT AND RIGHT SAPHENOUS VEINS (N/A) TRANSESOPHAGEAL ECHOCARDIOGRAM (TEE) (N/A)  1. CV-BP climbing. Will start low dose ACEI since creatinine is at baseline. NSR in the 70s. Hx of afib, brief run of SVT on Amio PO and Lopressor, Added Eliquis. Continue Lipitor and ASA.  2. Pulm-CXR small bilateral pleural effusions, left > right. Tolerating room air. Encouraged incentive spirometer. 3. Renal-creatinine 1.17, hypokalemia-replaced. Weight continuing to trend down. He remains about 5 lbs fluid overloaded. Lasix 40mg  oral today 4. H and H 10.5/32.4, platelets trending up 5. Endo-blood glucose well controlled.   Plan: Work on ambulation today.PT following and assisting. He is limited by his calf pain which he attributes to his knee surgery. Continue diuretics for fluid overload.    LOS: 7 days    Sharlene Dory 06/03/2017   He was on lisinopril preop- will restart  CBG normal- dc checks  Viviann Spare C. Dorris Fetch, MD [facility] [phone]

## 2017-06-03 NOTE — Progress Notes (Signed)
Physical Therapy Treatment Patient Details Name: Jon Garner. MRN: [id number] DOB: 02-18-1965 Today's Date: 06/03/2017    History of Present Illness Pt adm with chest pain and found to have NSTEMI. 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 PMH - PVD, ICA occlusion, obesity, knee surgery    PT Comments    Pt making steady progress. Able to perform stairs.    Follow Up Recommendations  Home health PT     Equipment Recommendations  Other (comment)(rollator)    Recommendations for Other Services       Precautions / Restrictions Precautions Precautions: Sternal Precaution Booklet Issued: Yes (comment)    Mobility  Bed Mobility               General bed mobility comments: Pt up in bathroom  Transfers Overall transfer level: Needs assistance Equipment used: None Transfers: Sit to/from Stand Sit to Stand: Supervision         General transfer comment: Pt rose from toilet unassisted in the bathroom  Ambulation/Gait Ambulation/Gait assistance: Supervision Ambulation Distance (Feet): 100 Feet Assistive device: 4-wheeled walker Gait Pattern/deviations: Step-through pattern;Decreased stride length;Trunk flexed Gait velocity: decr Gait velocity interpretation: 1.31 - 2.62 ft/sec, indicative of limited community ambulator General Gait Details: Amb in hallway with rollator. Amb without assistive device in room. Stayed close to room with amb due to frequent bowel movements   Stairs Stairs: Yes Stairs assistance: Min guard Stair Management: One rail Right;Step to pattern;Forwards Number of Stairs: 4     Wheelchair Mobility    Modified Rankin (Stroke Patients Only)       Balance Overall balance assessment: Mild deficits observed, not formally tested                                          Cognition Arousal/Alertness: Awake/alert Behavior During Therapy: WFL for tasks assessed/performed Overall Cognitive Status: Within Functional Limits for  tasks assessed                                        Exercises      General Comments        Pertinent Vitals/Pain Pain Assessment: Faces Faces Pain Scale: Hurts little more Pain Location: knees Pain Descriptors / Indicators: Grimacing;Guarding Pain Intervention(s): Limited activity within patient's tolerance;Monitored during session    Home Living                      Prior Function            PT Goals (current goals can now be found in the care plan section) Progress towards PT goals: Progressing toward goals    Frequency    Min 3X/week      PT Plan Current plan remains appropriate    Co-evaluation              AM-PAC PT "6 Clicks" Daily Activity  Outcome Measure  Difficulty turning over in bed (including adjusting bedclothes, sheets and blankets)?: A Little Difficulty moving from lying on back to sitting on the side of the bed? : A Little Difficulty sitting down on and standing up from a chair with arms (e.g., wheelchair, bedside commode, etc,.)?: A Little Help needed moving to and from a bed to chair (including a wheelchair)?: A Little Help needed walking  in hospital room?: A Little Help needed climbing 3-5 steps with a railing? : A Little 6 Click Score: 18    End of Session   Activity Tolerance: Patient limited by fatigue Patient left: in chair;with call bell/phone within reach Nurse Communication: Mobility status PT Visit Diagnosis: Other abnormalities of gait and mobility (R26.89);Difficulty in walking, not elsewhere classified (R26.2)     Time: 1610-9604 PT Time Calculation (min) (ACUTE ONLY): 13 min  Charges:  $Gait Training: 8-22 mins                    G Codes:       [facility] PT [phone]    Angelina Ok [facility] 06/03/2017, 2:51 PM

## 2017-06-03 NOTE — Evaluation (Signed)
Occupational Therapy Evaluation Patient Details Name: Jon Garner. MRN: [id number] DOB: [birth year] Today's Date: 06/03/2017    History of Present Illness Pt adm with chest pain and found to have NSTEMI. 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 PMH - PVD, ICA occlusion, obesity, knee surgery   Clinical Impression   PTA, pt was independent with ADL and functional mobility. He presents to OT session highly motivated to return to independence with ADL. Pt with very supportive family who have already installed bidet to assist with toileting hygiene while pt with sternal precautions and will be able to provide 24 hour assistance. He currently requires min guard assist for toilet transfers and walk-in shower transfers. Pt reports that family may be able to install grab bars in shower and feel this would improve safety with shower transfers. Initiated education concerning LB ADL and toileting hygiene with AE to maximize independence. Family has ordered toilet aid and will bring in when it arrives for pt to practice. Pt currently requiring mod assist for LB ADL and would benefit from use of reacher to assist with this task and educated pt on methods to use this to don pants. Pt would benefit from continued OT services while admitted to maximize safety with ADL and functional mobility while adhering to sternal precautions prior to returning home. OT will continue to follow while admitted.    Follow Up Recommendations  No OT follow up;Supervision/Assistance - 24 hour    Equipment Recommendations  3 in 1 bedside commode    Recommendations for Other Services       Precautions / Restrictions Precautions Precautions: Sternal Precaution Booklet Issued: Yes (comment) Precaution Comments: Reviewed sternal precautions related to ADL.  Restrictions Other Position/Activity Restrictions: sternal precautions      Mobility Bed Mobility               General bed mobility comments: Pt up in chair on my  arrival.   Transfers Overall transfer level: Needs assistance Equipment used: None Transfers: Sit to/from Stand Sit to Stand: Min guard         General transfer comment: Min guard assist for safety.     Balance Overall balance assessment: Mild deficits observed, not formally tested                                         ADL either performed or assessed with clinical judgement   ADL Overall ADL's : Needs assistance/impaired Eating/Feeding: Set up;Sitting   Grooming: Supervision/safety;Standing   Upper Body Bathing: Set up;Sitting   Lower Body Bathing: Moderate assistance;Sit to/from stand   Upper Body Dressing : Set up;Sitting   Lower Body Dressing: Moderate assistance;Sit to/from stand   Toilet Transfer: Min guard;Ambulation;Comfort height toilet   Toileting- Clothing Manipulation and Hygiene: Sit to/from stand;Maximal assistance Toileting - Clothing Manipulation Details (indicate cue type and reason): educated on use of toilet aid; to practice next session Tub/ Shower Transfer: Min guard;Walk-in shower;Shower seat;Ambulation;Grab bars   Functional mobility during ADLs: Min guard General ADL Comments: Pt educated concerning impact of sternal precautions on ADL participation. Educated concerning use of toilet aide (family has ordered one), shower transfers, use of shower seat, use of reacher for LB dressing, and use of long handled sponge for bathing. Advised against use of sock aide due to potential strain on sternal incision.      Vision Baseline Vision/History: Wears glasses Wears Glasses:  Reading only Patient Visual Report: No change from baseline Vision Assessment?: No apparent visual deficits     Perception     Praxis      Pertinent Vitals/Pain Pain Assessment: Faces Faces Pain Scale: Hurts little more Pain Location: knees Pain Descriptors / Indicators: Grimacing;Guarding Pain Intervention(s): Limited activity within patient's  tolerance;Monitored during session;Repositioned     Hand Dominance Right   Extremity/Trunk Assessment Upper Extremity Assessment Upper Extremity Assessment: Overall WFL for tasks assessed   Lower Extremity Assessment Lower Extremity Assessment: Defer to PT evaluation       Communication Communication Communication: No difficulties   Cognition Arousal/Alertness: Awake/alert Behavior During Therapy: WFL for tasks assessed/performed Overall Cognitive Status: Within Functional Limits for tasks assessed                                     General Comments       Exercises     Shoulder Instructions      Home Living Family/patient expects to be discharged to:: Private residence Living Arrangements: Parent;Other relatives(2 brothers) Available Help at Discharge: Family Type of Home: House       Home Layout: Two level;Bed/bath upstairs Alternate Level Stairs-Number of Steps: flight Alternate Level Stairs-Rails: Right Bathroom Shower/Tub: Producer, television/film/video: Standard     Home Equipment: Other (comment);Shower seat   Additional Comments: Family has set up bidet on pt's commode. They have also purchased toilet aide      Prior Functioning/Environment Level of Independence: Independent        Comments: Lives with 2 brothers and elderly mother.         OT Problem List: Decreased strength;Decreased activity tolerance;Impaired balance (sitting and/or standing);Decreased knowledge of use of DME or AE;Decreased knowledge of precautions;Cardiopulmonary status limiting activity;Pain      OT Treatment/Interventions: Self-care/ADL training;Therapeutic exercise;Therapeutic activities;Patient/family education;Balance training;DME and/or AE instruction    OT Goals(Current goals can be found in the care plan section) Acute Rehab OT Goals Patient Stated Goal: return home OT Goal Formulation: With patient Time For Goal Achievement:  06/17/17 Potential to Achieve Goals: Good ADL Goals Pt Will Perform Grooming: with modified independence;standing Pt Will Perform Upper Body Bathing: with modified independence;sitting Pt Will Perform Lower Body Bathing: with modified independence;with adaptive equipment;sitting/lateral leans Pt Will Transfer to Toilet: with modified independence;ambulating;bedside commode Pt Will Perform Toileting - Clothing Manipulation and hygiene: with modified independence;sit to/from stand;with adaptive equipment(adhering to sternal precautions) Additional ADL Goal #1: Pt will don pants with modified independence using reacher.  OT Frequency: Min 2X/week   Barriers to D/C:            Co-evaluation              AM-PAC PT "6 Clicks" Daily Activity     Outcome Measure Help from another person eating meals?: None Help from another person taking care of personal grooming?: A Little Help from another person toileting, which includes using toliet, bedpan, or urinal?: A Lot Help from another person bathing (including washing, rinsing, drying)?: A Lot Help from another person to put on and taking off regular upper body clothing?: A Little Help from another person to put on and taking off regular lower body clothing?: A Lot 6 Click Score: 16   End of Session    Activity Tolerance: Patient tolerated treatment well Patient left: in chair;with call bell/phone within reach  OT Visit Diagnosis: Other abnormalities  of gait and mobility (R26.89);Pain;Muscle weakness (generalized) (M62.81) Pain - Right/Left: (bilateral) Pain - part of body: Knee(sternal incision)                Time: 4098-1191 OT Time Calculation (min): 28 min Charges:  OT General Charges $OT Visit: 1 Visit OT Evaluation $OT Eval Moderate Complexity: 1 Mod OT Treatments $Self Care/Home Management : 8-22 mins G-Codes:     Doristine Section, MS OTR/L  Pager: [phone]   Keithon Mccoin A Tahjae Durr 06/03/2017, 5:01 PM

## 2017-06-03 NOTE — Progress Notes (Signed)
CARDIAC REHAB PHASE I   PRE:  Rate/Rhythm: 87 SR  BP:  Supine:   Sitting: 165/90  Standing:    SaO2: 94%RA  MODE:  Ambulation: 250 ft   POST:  Rate/Rhythm: 110 ST  BP:  Supine:   Sitting: 173/82  Standing:    SaO2: 99%RA 1130-1205 Pt walked 250 ft on RA with rollator independently. Pt with some DOE but sats good. Pt stated he is SOB from pain in knees.  To recliner and set up lunch. PT to see in pm. Pt will need rollator for home use.   Luetta Nutting, RN BSN  06/03/2017 12:00 PM

## 2017-06-04 LAB — BASIC METABOLIC PANEL
Anion gap: 8 (ref 5–15)
BUN: 15 mg/dL (ref 6–20)
CHLORIDE: 99 mmol/L — AB (ref 101–111)
CO2: 29 mmol/L (ref 22–32)
CREATININE: 1.17 mg/dL (ref 0.61–1.24)
Calcium: 8.2 mg/dL — ABNORMAL LOW (ref 8.9–10.3)
GFR calc Af Amer: >60 mL/min (ref >60)
GFR calc non Af Amer: >60 mL/min (ref >60)
GLUCOSE: 105 mg/dL — AB (ref 65–99)
POTASSIUM: 4.5 mmol/L (ref 3.5–5.1)
SODIUM: 136 mmol/L (ref 135–145)

## 2017-06-04 MED ORDER — OXYCODONE HCL 5 MG PO TABS: 5.0000 mg | ORAL_TABLET | Freq: Four times a day (QID) | ORAL | 0 refills | Status: DC | PRN

## 2017-06-04 MED ORDER — ACETAMINOPHEN 325 MG PO TABS: 650.0000 mg | ORAL_TABLET | Freq: Four times a day (QID) | ORAL | Status: DC | PRN

## 2017-06-04 MED ORDER — POTASSIUM CHLORIDE CRYS ER 20 MEQ PO TBCR
40.0000 meq | EXTENDED_RELEASE_TABLET | Freq: Every day | ORAL | Status: DC
  Administered 2017-06-04: 40 meq via ORAL
  Filled 2017-06-04: qty 2

## 2017-06-04 MED ORDER — APIXABAN 5 MG PO TABS: 5.0000 mg | ORAL_TABLET | Freq: Two times a day (BID) | ORAL | 1 refills | Status: DC

## 2017-06-04 MED ORDER — AMIODARONE HCL 200 MG PO TABS: ORAL_TABLET | ORAL | 1 refills | Status: DC

## 2017-06-04 MED ORDER — CEPHALEXIN 500 MG PO CAPS: 500.0000 mg | ORAL_CAPSULE | Freq: Three times a day (TID) | ORAL | 0 refills | Status: AC

## 2017-06-04 MED ORDER — CEPHALEXIN 500 MG PO CAPS
500.0000 mg | ORAL_CAPSULE | Freq: Three times a day (TID) | ORAL | Status: DC
  Administered 2017-06-04: 500 mg via ORAL
  Filled 2017-06-04: qty 1

## 2017-06-04 MED ORDER — FUROSEMIDE 40 MG PO TABS: 40.0000 mg | ORAL_TABLET | Freq: Every day | ORAL | 1 refills | Status: DC

## 2017-06-04 NOTE — Progress Notes (Addendum)
      [address]       [place] [address]             [phone]      6 Days Post-Op Procedure(s) (LRB): CORONARY ARTERY BYPASS GRAFTING (CABG)  X 4 WITH OPEN HARVESTING OF LEFT AND RIGHT SAPHENOUS VEINS (N/A) TRANSESOPHAGEAL ECHOCARDIOGRAM (TEE) (N/A) Subjective: No issues overnight. He is walking a little easier.   Objective: Vital signs in last 24 hours: Temp:  [97.8 F (36.6 C)-98.7 F (37.1 C)] 97.8 F (36.6 C) (05/01 0559) Pulse Rate:  [85-93] 93 (05/01 0559) Cardiac Rhythm: Normal sinus rhythm (05/01 0559) Resp:  [18-27] 19 (05/01 0559) BP: (131-177)/(69-96) 159/69 (05/01 0559) SpO2:  [90 %-99 %] 98 % (05/01 0559) Weight:  [300 lb 1.6 oz (136.1 kg)] 300 lb 1.6 oz (136.1 kg) (05/01 0559)     Intake/Output from previous day: 04/30 0701 - 05/01 0700 In: 960 [P.O.:960] Out: -  Intake/Output this shift: No intake/output data recorded.  General appearance: alert, cooperative and no distress Heart: regular rate and rhythm, S1, S2 normal, no murmur, click, rub or gallop Lungs: clear to auscultation bilaterally Abdomen: soft, non-tender; bowel sounds normal; no masses,  no organomegaly Extremities: 1-2+ pitting pedal edema Wound: sternal incision and EVH sites on bilateral legs are clean and dry. Staples in place.   Lab Results: Recent Labs    06/02/17 0258  WBC 10.4  HGB 10.5*  HCT 32.4*  PLT 215   BMET:  Recent Labs    06/02/17 0258 06/04/17 0246  NA 136 136  K 3.3* 4.5  CL 101 99*  CO2 26 29  GLUCOSE 123* 105*  BUN 16 15  CREATININE 1.17 1.17  CALCIUM 7.9* 8.2*    PT/INR: No results for input(s): LABPROT, INR in the last 72 hours. ABG    Component Value Date/Time   PHART 7.357 05/30/2017 0107   HCO3 21.2 05/30/2017 0107   TCO2 23 05/30/2017 1616   ACIDBASEDEF 3.0 (H) 05/30/2017 0107   O2SAT 97.0 05/30/2017 0107   CBG (last 3)  Recent Labs    06/02/17 1713 06/02/17 2035 06/03/17 0619  GLUCAP 79 93 92     Assessment/Plan: S/P Procedure(s) (LRB): CORONARY ARTERY BYPASS GRAFTING (CABG)  X 4 WITH OPEN HARVESTING OF LEFT AND RIGHT SAPHENOUS VEINS (N/A) TRANSESOPHAGEAL ECHOCARDIOGRAM (TEE) (N/A)  1. CV-BP better controlled with the addition of Lisinopril. May need additional agents since he is still intermittently elevated. Continue Amio PO, ASA, and Lipitor.  2. Pulm-tolerating room air with good oxygenation.  3. Renal-creatinine 1.17, Potassium 4.5, will drop supplementation to once a day. Probably about 2 lbs over baseline.  4. H and H remains stable 5. CBGs discontinued, have been stable 6. PT following and assisting with chronic orthopedic issues.   Plan: Likely home tomorrow. Needs to work on ambulation, he only walked 250 ft yesterday. Continue diuresis for fluid overload.      LOS: 8 days    Sharlene Dory 06/04/2017 Patient seen and examined, agree with above Has some minimal erythema at inferior aspect of sternal wound- will start PO Keflex He has never had PCN so "allergy" is based on a strong family history of reactions- no contraindication to cephalosporin Will arrange home PT Dc home later today if arrangements can be completed  Viviann Spare C. Dorris Fetch, MD [facility] [phone]

## 2017-06-04 NOTE — Care Management Note (Addendum)
Case Management Note Jon Pierini RN, BSN Unit 4E-Case Manager--- 2H coverage [phone]  Patient Details  Name: Jon Garner. MRN: [id number] Date of Birth: 28-Jun-1965  Subjective/Objective:  Pt admitted with NSTEMI, s/p CABGx4 on 05/29/17                  Action/Plan: PTA pt lived at home, independent- anticipate return home- CM to follow for transition of care needs  Expected Discharge Date:                  Expected Discharge Plan:  Home/Self Care  In-House Referral:  NA  Discharge planning Services  CM Consult, Medication Assistance  Post Acute Care Choice:  Home Health, Durable Medical Equipment Choice offered to:  Patient  DME Arranged:  Walker rolling DME Agency:  [facility]  HH Arranged:  PT [facility] Agency:  [facility]  Status of Service:  Completed, signed off  If discussed at Long Length of Stay Meetings, dates discussed:    Discharge Disposition: home/home health   Additional Comments:  06/04/17- 1100- Jon Buchholz RN, CM - pt for possible d/c later today- spoke with pt at bedside for transition of care needs to return home with family support -mom, brothers, sister. Per pt he has a PCP- Jon Garner at [facility], uses [facility] on [address] [place]- confirmed he does not have insurance- will call [facility] to see pt or call pt in room. Discussed HHPT order and DME need for walker [facility] can provide a RW however not a rollator due to charity status)- pt asked about cost for rollator discussed this and options for going to local DME stores, [employer], etc or finding one online. - Pt agreeable to [facility] services with [facility] and being assessed for [facility] care. Further discussion regarding medications- pt has been started on Eliquis- 30 day free card provided along with application for pt assistance and process for that. Pt will f/u with Cardiology on May 16. Reviewed pt's other medications which are all generic or OTC-  at this time no need for MATCH noted- CM will follow for final medications. - Call made to [facility] with [facility] for HHPT needs - Jon Garner with come assess pt for charity care eligibility - call made also to [facility] with [facility] who will deliver RW to bedside prior to discharge.    Update- 1330- reviewed final medications with pt and cost- per pt he states that he can afford out of pocket cost- provided pt with GoodRx coupon for abx- and info on GoodRx app for his future use. Pt to d/c home today.   Jon Span, RN 06/04/2017, 11:24 AM

## 2017-06-04 NOTE — Progress Notes (Signed)
[name] to be D/C'd Home per MD order. Discussed with the patient and all questions fully answered.    IV catheter discontinued intact. Site without signs and symptoms of complications. Dressing and pressure applied.  An After Visit Summary was printed and given to the patient.  Patient to be escorted via WC, and D/C home via private auto.  Kai Levins  06/04/2017 2:13 PM

## 2017-06-04 NOTE — Progress Notes (Signed)
Staples removed from [facility] site incisions per order.

## 2017-06-04 NOTE — Progress Notes (Signed)
CARDIAC REHAB PHASE I   PRE:  Rate/Rhythm: 72 SR  BP:  Supine:   Sitting: 147/76  Standing:    SaO2: 96%RA  MODE:  Ambulation: 400 ft   POST:  Rate/Rhythm: 92 SR  BP:  Supine:   Sitting: 143/60  Standing:    SaO2: 95%RA 1015-1100 Pt walked 400 ft on RA with rollator independently with steady gait. He stopped a couple of  times due to DOE. Encouraged pursed lip breathing. Reviewed sternal precautions and staying in the tube. Gave heart healthy diet, wrote down how to view discharge video, and gave modified ex ed. Discussed CRP 2 and will refer to [facility] program. Would recommend rollator or rolling walker for home use.   Luetta Nutting, RN BSN  06/04/2017 10:55 AM

## 2017-06-04 NOTE — Addendum Note (Signed)
Addendum  created 06/04/17 2306 by Dorris Singh, MD   Diagnosis association updated

## 2017-06-04 NOTE — Plan of Care (Signed)
  Problem: Health Behavior/Discharge Planning: Goal: Ability to manage health-related needs will improve Outcome: Progressing   Problem: Clinical Measurements: Goal: Will remain free from infection Outcome: Progressing   Problem: Education: Goal: Understanding of CV disease, CV risk reduction, and recovery process will improve Outcome: Progressing   Problem: Activity: Goal: Ability to return to baseline activity level will improve Outcome: Progressing   Problem: Cardiovascular: Goal: Ability to achieve and maintain adequate cardiovascular perfusion will improve Outcome: Progressing   Problem: Health Behavior/Discharge Planning: Goal: Ability to safely manage health-related needs after discharge will improve Outcome: Progressing   Problem: Education: Goal: Understanding of CV disease, CV risk reduction, and recovery process will improve Outcome: Progressing   Problem: Activity: Goal: Ability to return to baseline activity level will improve Outcome: Progressing   Problem: Cardiovascular: Goal: Ability to achieve and maintain adequate cardiovascular perfusion will improve Outcome: Progressing   Problem: Health Behavior/Discharge Planning: Goal: Ability to safely manage health-related needs after discharge will improve Outcome: Progressing   Problem: Education: Goal: Ability to demonstrate proper wound care will improve Outcome: Progressing Goal: Knowledge of disease or condition will improve Outcome: Progressing Goal: Knowledge of the prescribed therapeutic regimen will improve Outcome: Progressing   Problem: Activity: Goal: Risk for activity intolerance will decrease Outcome: Progressing   Problem: Cardiac: Goal: Hemodynamic stability will improve Outcome: Progressing   Problem: Clinical Measurements: Goal: Postoperative complications will be avoided or minimized Outcome: Progressing   Problem: Skin Integrity: Goal: Wound healing without signs and symptoms  of infection Outcome: Progressing Goal: Risk for impaired skin integrity will decrease Outcome: Progressing

## 2017-06-05 ENCOUNTER — Other Ambulatory Visit: Payer: Self-pay

## 2017-06-05 MED ORDER — ATORVASTATIN CALCIUM 80 MG PO TABS: 80.0000 mg | ORAL_TABLET | Freq: Every day | ORAL | 1 refills | Status: DC

## 2017-06-05 MED ORDER — FUROSEMIDE 40 MG PO TABS: 40.0000 mg | ORAL_TABLET | Freq: Every day | ORAL | 1 refills | Status: DC

## 2017-06-05 MED ORDER — METOPROLOL TARTRATE 25 MG PO TABS: 25.0000 mg | ORAL_TABLET | Freq: Two times a day (BID) | ORAL | 1 refills | Status: DC

## 2017-06-05 MED ORDER — OMEPRAZOLE 20 MG PO CPDR: 20.0000 mg | DELAYED_RELEASE_CAPSULE | Freq: Every day | ORAL | 1 refills | Status: DC | PRN

## 2017-06-05 MED ORDER — LISINOPRIL 20 MG PO TABS: 20.0000 mg | ORAL_TABLET | Freq: Every day | ORAL | 1 refills | Status: DC

## 2017-06-05 MED ORDER — POTASSIUM CHLORIDE CRYS ER 20 MEQ PO TBCR: 20.0000 meq | EXTENDED_RELEASE_TABLET | Freq: Every day | ORAL | 1 refills | Status: DC

## 2017-06-09 ENCOUNTER — Telehealth: Payer: Self-pay

## 2017-06-09 NOTE — Telephone Encounter (Signed)
Jon Garner, physical therapy with [facility] called to get Verbal Orders for physical therapy 2 times a week/ 3 weeks following surgery and discharge from the hospital.  Orders given, written order will be faxed.

## 2017-06-11 ENCOUNTER — Telehealth: Payer: Self-pay | Admitting: Family Medicine

## 2017-06-11 NOTE — Telephone Encounter (Signed)
Pt called saying he is a patient of Dr. Wonda Olds but has not been in in awhile.  (2015) .Marland Kitchen He brings his mom in.  He was released from [facility] with a quadrupole bypass on 06/05/17   He is seeing Dr. Leo Grosser for cardiac care  Patient just wanted to let you know what was going on.   Pt call back is [phone] incase you want to call him...  Thanks [employer]

## 2017-06-12 ENCOUNTER — Ambulatory Visit (INDEPENDENT_AMBULATORY_CARE_PROVIDER_SITE_OTHER): Payer: Self-pay | Admitting: *Deleted

## 2017-06-12 DIAGNOSIS — Z4802 Encounter for removal of sutures: Secondary | ICD-10-CM

## 2017-06-12 DIAGNOSIS — Z951 Presence of aortocoronary bypass graft: Secondary | ICD-10-CM

## 2017-06-12 DIAGNOSIS — I2510 Atherosclerotic heart disease of native coronary artery without angina pectoris: Secondary | ICD-10-CM

## 2017-06-12 DIAGNOSIS — I214 Non-ST elevation (NSTEMI) myocardial infarction: Secondary | ICD-10-CM

## 2017-06-12 NOTE — Progress Notes (Signed)
Jon Garner returns to the office s/p CABG X 4 for staple/suture removal. He had every other staple removed prior to discharge except for the extreme upper right thigh. Diet and bowels are good. He is using an occasional pain med. He is receiving home PT. His sternal incision is healing well except for the distal area which is draining a small amount of serosanguinous  fluid. He states it hasn't changed since he was in the hospital.I was able to express the fluid which has no odor or appearance of infection. The area is slow to heal with a layer of fatty exposed. I supplied him with betadine swab sticks to apply daily after his shower x 10 days only. The chest tube sutures x 2 were easily removed. Staples were removed from his right and left evh incisions and benzoin/steristrips applied. There was slight non approximation of the upper incisions and redness also which I feel is staple irritation. He was instructed in wound care and signs of infection. He also is concerned with the generalized itching he has experienced while in the hospital and continues. There is no obvious rash noted. I suggested using oral Benadryl until he sees cardiology and review his meds since they are all new to him. He agrees. He will also have them complete paperwork for Eliquis. He will return as scheduled with a CXR.

## 2017-06-18 NOTE — Progress Notes (Signed)
Cardiology Office Note Date:  06/19/2017  Patient ID:  Jon Rockefeller., DOB [birth year], MRN [id number] PCP:  Patient, No Pcp Per  Cardiologist:  Dr. Mariah Milling, MD    Chief Complaint: Hospital follow up  History of Present Illness: Jon Snee. is a 52 y.o. 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   In 2015, he was evaluated by cardiology secondary to exertional dyspnea, diaphoresis, and chest pain.  He underwent stress testing which was reportedly normal and he was diagnosed with GERD and placed on Prilosec.  Following that, he would have intermittent episodes of exertional diaphoresis and dyspnea that occurred almost exclusively while he was up moving around in the kitchen and cooking (works as a Investment banker, operational).  Patient was admitted to [facility] on 4/21 with exertional chest pain for the prior 3 to 4 weeks.  He was found to have a non-STEMI with a peak troponin of 9.17.  CTA of the chest was negative for PE with multivessel coronary artery disease noted.  TTE on 05/26/2017 showed an EF of 55 to 65%, normal wall motion, grade 1 diastolic dysfunction, left atrium normal in size, RV systolic function normal, PASP normal.  He underwent LHC on 05/26/2017 that showed the proximal LAD 95% stenosed, mid LAD 95% stenosed, mid to distal LAD 90% stenosed, ostial D2 95% stenosis, proximal LCx 80% stenosed, mid RCA 70% stenosed, distal RCA 80% stenosed.  He was transferred to [facility] for evaluation by cardiothoracic surgery for CABG.  He underwent successful four-vessel CABG on 05/29/2016 by Dr. Dorris Fetch as detailed above.  Of note, the patient did have a brief episode of atrial flutter intraoperatively.   Postoperative course was notable for mild volume overload requiring gentle diuresis with a mild elevation in serum creatinine peaking at 1.5 as well as a brief episode of A. fib that was successfully treated with amiodarone.  This was followed by a brief run of SVT.  Discharge labs showed potassium 4.5 with hemolysis noted, serum creatinine 1.17, WBC 10.4, hemoglobin 10.5, platelet count 215.  Discharge medications including amiodarone 200 mg twice daily x3 days followed by 200 mg daily thereafter, Eliquis 5 mg twice daily, aspirin 81 mg daily, Lipitor 80 mg daily, Lasix 40 mg daily, lisinopril 20 mg daily, Lopressor 25 mg twice daily, KCl 20 mEq daily, as well as his noncardiac medications.  Discharge weight of 300 pounds.  He comes in doing well today.  His soreness from his surgical site continues to improve and is now only noted when he sneezes or coughs.  He continues to advance activities as tolerated, within surgical guidelines.  He is working with home health PT currently though is interested in enrolling in cardiac rehab once his home health PT is completed.  No no chest pain outside of soreness from his surgical site, shortness of breath, nausea, vomiting, diaphoresis, palpitations, dizziness, presyncope, or syncope.  Tolerating all medications without issues.  He reports blood pressure readings with home health PT in the 140s to 160s systolic prior to exercise.  He also notes his snoring has significantly improved since his bypass surgery and with weight loss.  He has significantly improved his diet  and is interested in increasing his exercise regimen when cleared by surgery.  He has a goal weight of 200 pounds by the end of 2019.   Past Medical History:  Diagnosis Date  . CAD (coronary artery disease)    a. LHC 4/19: pLAD 95%, mLAD 95%, m/dLAD 90%, ostD2 95%, pLCx 80%, mRCA 70%, dRCA 80%; b. 4-V CABG 05/2017: LIMA-LAD, VG-D1, VG-PDA, VG-OM2  . Essential hypertension 2014  . GERD  (gastroesophageal reflux disease) 2015  . Morbid obesity (HCC) presently  . PXE (pseudoxanthoma elasticum) 1992  . Snores presently  . Stroke [facility]) 1992   a. Age 27    Past Surgical History:  Procedure Laterality Date  . CORONARY ARTERY BYPASS GRAFT N/A 05/29/2017   Procedure: CORONARY ARTERY BYPASS GRAFTING (CABG)  X 4 WITH OPEN HARVESTING OF LEFT AND RIGHT SAPHENOUS VEINS;  Surgeon: Loreli Slot, MD;  Location: [facility] OR;  Service: Open Heart Surgery;  Laterality: N/A;  . KNEE SURGERY Bilateral   . LEFT HEART CATH AND CORONARY ANGIOGRAPHY N/A 05/26/2017   Procedure: LEFT HEART CATH AND CORONARY ANGIOGRAPHY;  Surgeon: Antonieta Iba, MD;  Location: [facility] INVASIVE CV LAB;  Service: Cardiovascular;  Laterality: N/A;  . TEE WITHOUT CARDIOVERSION N/A 05/29/2017   Procedure: TRANSESOPHAGEAL ECHOCARDIOGRAM (TEE);  Surgeon: Loreli Slot, MD;  Location: [facility] OR;  Service: Open Heart Surgery;  Laterality: N/A;    Current Meds  Medication Sig  . acetaminophen (TYLENOL) 325 MG tablet Take 2 tablets (650 mg total) by mouth every 6 (six) hours as needed for mild pain.  Marland Kitchen amiodarone (PACERONE) 200 MG tablet Please take 1 tab (200mg ) twice a day for 3 more days then take 1 tab (200mg ) once a day until we see you in follow-up.  Marland Kitchen apixaban (ELIQUIS) 5 MG TABS tablet Take 1 tablet (5 mg total) by mouth 2 (two) times daily.  Marland Kitchen aspirin EC 81 MG tablet Take 81 mg by mouth once.  Marland Kitchen atorvastatin (LIPITOR) 80 MG tablet Take 1 tablet (80 mg total) by mouth daily at 6 PM.  . furosemide (LASIX) 40 MG tablet Take 1 tablet (40 mg total) by mouth daily.  . metoprolol tartrate (LOPRESSOR) 25 MG tablet Take 1 tablet (25 mg total) by mouth 2 (two) times daily.  Marland Kitchen omeprazole (PRILOSEC) 20 MG capsule Take 1 capsule (20 mg total) by mouth daily as needed (acid reflux.).  Marland Kitchen oxyCODONE (OXY IR/ROXICODONE) 5 MG immediate release tablet Take 1 tablet (5 mg total) by mouth every 6 (six) hours as needed for severe  pain.  . potassium chloride SA (K-DUR,KLOR-CON) 20 MEQ tablet Take 1 tablet (20 mEq total) by mouth daily.  . ranitidine (ZANTAC) 150 MG tablet Take 150 mg by mouth 2 (two) times daily.  . [DISCONTINUED] apixaban (ELIQUIS) 5 MG TABS tablet Take 1 tablet (5 mg total) by mouth 2 (two) times daily.  . [DISCONTINUED] lisinopril (PRINIVIL,ZESTRIL) 20 MG tablet Take 1 tablet (20 mg total) by mouth daily.    Allergies:   Penicillins   Social History:  The patient  reports that he quit smoking about 3 years ago. His smoking use included cigarettes. He has a 15.00 pack-year smoking history. He has never used smokeless tobacco. He reports that he drank alcohol. He reports that he does not use drugs.   Family History:  The patient's family history includes COPD in his father; Dementia in his mother; Multiple sclerosis in his sister; Other in his father.  ROS:   Review of Systems  Constitutional: Positive for malaise/fatigue. Negative for chills, diaphoresis, fever and weight loss.  HENT: Negative for congestion.   Eyes: Negative for discharge and redness.  Respiratory: Negative for cough, hemoptysis, sputum production, shortness of breath and wheezing.   Cardiovascular: Positive for chest pain. Negative for palpitations, orthopnea, claudication, leg swelling and PND.       Mild anterior chest wall pain with coughing and sneezing  Gastrointestinal: Negative for abdominal pain, blood in stool, heartburn, melena, nausea and vomiting.  Genitourinary: Negative for hematuria.  Musculoskeletal: Negative for falls and myalgias.  Skin: Negative for rash.  Neurological: Negative for dizziness, tingling, tremors, sensory change, speech change, focal weakness, loss of consciousness and weakness.  Endo/Heme/Allergies: Does not bruise/bleed easily.  Psychiatric/Behavioral: Negative for substance abuse. The patient is not nervous/anxious.   All other systems reviewed and are negative.    PHYSICAL EXAM:  VS:   BP 136/72 (BP Location: Left Arm, Patient Position: Sitting, Cuff Size: Normal)   Pulse 68   Ht 5\' 8"  (1.727 m)   Wt 283 lb (128.4 kg)   BMI 43.03 kg/m  BMI: Body mass index is 43.03 kg/m.  Physical Exam  Constitutional: He is oriented to person, place, and time. He appears well-developed and well-nourished.  HENT:  Head: Normocephalic and atraumatic.  Eyes: Right eye exhibits no discharge. Left eye exhibits no discharge.  Neck: Normal range of motion. No JVD present.  Cardiovascular: Normal rate, regular rhythm, S1 normal, S2 normal and normal heart sounds. Exam reveals no distant heart sounds, no friction rub, no midsystolic click and no opening snap.  No murmur heard. Pulses:      Posterior tibial pulses are 2+ on the right side, and 2+ on the left side.  Midline anterior chest wall surgical scar healing well without active bleeding, bruising, swelling, warmth, discharge, or tenderness to palpation.  Surgical port sites along the anterior chest wall are also healing well without active bleeding, bruising, swelling, warmth, erythema, or tenderness to palpation.  Pulmonary/Chest: Effort normal and breath sounds normal. No respiratory distress. He has no decreased breath sounds. He has no wheezes. He has no rales. He exhibits no tenderness.  Abdominal: Soft. He exhibits no distension. There is no tenderness.  Musculoskeletal: He exhibits no edema.  Right lower extremity vein harvest surgical site is well-healing without active bleeding, bruising, swelling, warmth, discharge, or tenderness to palpation.  Neurological: He is alert and oriented to person, place, and time.  Skin: Skin is warm and dry. No cyanosis. Nails show no clubbing.  Psychiatric: He has a normal mood and affect. His speech is normal and behavior is normal. Judgment and thought content normal.     EKG:  Was ordered and interpreted by me today. Shows NSR, 68 bpm, prior inferior infarct, nonspecific ST-T changes, poor R  wave progression  Recent Labs: 05/25/2017: B Natriuretic Peptide 52.0 05/30/2017: Magnesium 2.5 05/31/2017: ALT 32 06/01/2017: TSH 3.225 06/02/2017: Hemoglobin 10.5; Platelets 215 06/04/2017: BUN 15; Creatinine, Ser 1.17; Potassium 4.5; Sodium 136  05/27/2017: Cholesterol 135; HDL 25; LDL Cholesterol 82; Total CHOL/HDL Ratio 5.4; Triglycerides 140; VLDL 28   Estimated Creatinine Clearance: 96.5 mL/min (by C-G formula based on SCr of 1.17 mg/dL).   Wt Readings from Last 3 Encounters:  06/19/17 283 lb (128.4 kg)  06/04/17 (!) 300 lb 1.6 oz (136.1 kg)  05/26/17 295 lb (133.8 kg)     Other studies reviewed: Additional studies/records reviewed today include: summarized above  ASSESSMENT AND PLAN:  1. CAD of the  native coronary arteries without angina s/p 4-vessel CABG: No symptoms concerning for angina at this time.  Continue to advance activities with and surgical guidelines.  Continue aspirin 81 mg daily as well as Lopressor 25 mg twice daily, lisinopril 40 mg daily, and Lipitor 80 mg daily.  Aggressive risk factor modification with secondary prevention.  Follow-up with cardiothoracic surgery as directed.  He has been referred to cardiac rehab.  2. Perioperative Afib/flutter/SVT: No further known episodes of A. fib/flutter/SVT.  Continue amiodarone 200 mg daily along with Eliquis 5 mg twice daily until seen by cardiothoracic surgery.  Hopefully, if he does not have any recurrence of arrhythmia these can be discontinued in the near future though I will defer to CVTS.  Continue Lopressor 25 mg twice daily.  3. HTN: Blood pressure is mildly elevated today at 136/72.  He reports home health PT BP readings in the 140s to 160 systolic.  Heart rate precludes further titration of blood pressure at this time.  Titrate lisinopril to 40 mg daily.  Continue Lopressor 25 mg twice daily along with Lasix 40 mg daily.  4. HLD: LDL 82 from 05/2017, goal < 70.  Continue Lipitor 80 mg daily.  Recheck fasting lipid  and liver function in late June, 2019.  If LDL is not at goal at that time consider changing to Crestor, +/-addition of Zetia.  5. Snoring: Patient reports this has significantly improved following his bypass as well as with weight loss.  We will refer him to pulmonology for outpatient sleep study.  6. 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  As above.  7. Morbid obesity: Weight loss advised.  Has been referred to cardiac rehab as above.  8. Anemia: Likely postoperative.  Check CBC.   Disposition: F/u with Dr. Okey Dupre in 4 to 6 weeks.  Current medicines are reviewed at length with the patient today.  The patient did not have any concerns regarding medicines.  Signed, Eula Listen, PA-C 06/19/2017 10:58 AM     [facility] [address] [place], [place] [address] [phone]

## 2017-06-19 ENCOUNTER — Encounter: Payer: Self-pay | Admitting: Physician Assistant

## 2017-06-19 ENCOUNTER — Ambulatory Visit: Payer: Self-pay | Admitting: Physician Assistant

## 2017-06-19 VITALS — BP 136/72 | HR 68 | Ht 68.0 in | Wt 283.0 lb

## 2017-06-19 DIAGNOSIS — E785 Hyperlipidemia, unspecified: Secondary | ICD-10-CM

## 2017-06-19 DIAGNOSIS — Z951 Presence of aortocoronary bypass graft: Secondary | ICD-10-CM

## 2017-06-19 DIAGNOSIS — Z8673 Personal history of transient ischemic attack (TIA), and cerebral infarction without residual deficits: Secondary | ICD-10-CM

## 2017-06-19 DIAGNOSIS — I2510 Atherosclerotic heart disease of native coronary artery without angina pectoris: Secondary | ICD-10-CM

## 2017-06-19 DIAGNOSIS — D649 Anemia, unspecified: Secondary | ICD-10-CM

## 2017-06-19 DIAGNOSIS — I10 Essential (primary) hypertension: Secondary | ICD-10-CM

## 2017-06-19 DIAGNOSIS — I214 Non-ST elevation (NSTEMI) myocardial infarction: Secondary | ICD-10-CM

## 2017-06-19 DIAGNOSIS — R0683 Snoring: Secondary | ICD-10-CM

## 2017-06-19 DIAGNOSIS — I4891 Unspecified atrial fibrillation: Secondary | ICD-10-CM

## 2017-06-19 DIAGNOSIS — I9789 Other postprocedural complications and disorders of the circulatory system, not elsewhere classified: Secondary | ICD-10-CM

## 2017-06-19 MED ORDER — APIXABAN 5 MG PO TABS: 5.0000 mg | ORAL_TABLET | Freq: Two times a day (BID) | ORAL | 3 refills | Status: DC

## 2017-06-19 MED ORDER — LISINOPRIL 40 MG PO TABS: 40.0000 mg | ORAL_TABLET | Freq: Every day | ORAL | 3 refills | Status: DC

## 2017-06-19 NOTE — Patient Instructions (Addendum)
Medication Instructions:  Your physician has recommended you make the following change in your medication:  1- INCREASE Lisinopril to 40 mg by  Mouth once a day.   Labwork: Your physician recommends that you return for lab work in: TODAY (BMET, CBC).   Your physician recommends that you return for lab work AT THE END OF June. (LIPID, CMET) - SOMETIME THE WEEK OF July 28, 2017. - Please go to the [facility]. You will check in at the front desk to the right as you walk into the atrium. Valet Parking is offered if needed.  - You will need to be fasting. Do not eat or drink after midnight the morning of the lab work.    Testing/Procedures: none  Follow-Up: You have been referred to [facility] for sleep study evaluation.  You have been referred to Cardiac Rehab. Please call, [phone], if you have not heard anything in the next week.  Your physician recommends that you schedule a follow-up appointment in: 1 MONTH WITH DR END.  If you need a refill on your cardiac medications before your next appointment, please call your pharmacy.

## 2017-06-20 LAB — CBC WITH DIFFERENTIAL/PLATELET
Basophils Absolute: 0 10*3/uL (ref 0.0–0.2)
Basos: 1 %
EOS (ABSOLUTE): 0.9 10*3/uL — ABNORMAL HIGH (ref 0.0–0.4)
EOS: 12 %
HEMATOCRIT: 38.3 % (ref 37.5–51.0)
Hemoglobin: 12.9 g/dL — ABNORMAL LOW (ref 13.0–17.7)
Immature Grans (Abs): 0 10*3/uL (ref 0.0–0.1)
Immature Granulocytes: 0 %
LYMPHS ABS: 1.8 10*3/uL (ref 0.7–3.1)
Lymphs: 25 %
MCH: 28.9 pg (ref 26.6–33.0)
MCHC: 33.7 g/dL (ref 31.5–35.7)
MCV: 86 fL (ref 79–97)
MONOS ABS: 0.6 10*3/uL (ref 0.1–0.9)
Monocytes: 8 %
NEUTROS ABS: 3.9 10*3/uL (ref 1.4–7.0)
Neutrophils: 54 %
Platelets: 507 10*3/uL — ABNORMAL HIGH (ref 150–379)
RBC: 4.46 x10E6/uL (ref 4.14–5.80)
RDW: 13.8 % (ref 12.3–15.4)
WBC: 7.3 10*3/uL (ref 3.4–10.8)

## 2017-06-20 LAB — BASIC METABOLIC PANEL
BUN / CREAT RATIO: 18 (ref 9–20)
BUN: 20 mg/dL (ref 6–24)
CO2: 19 mmol/L — AB (ref 20–29)
Calcium: 9.1 mg/dL (ref 8.7–10.2)
Chloride: 104 mmol/L (ref 96–106)
Creatinine, Ser: 1.14 mg/dL (ref 0.76–1.27)
GFR, EST AFRICAN AMERICAN: 85 mL/min/{1.73_m2} (ref >59)
GFR, EST NON AFRICAN AMERICAN: 74 mL/min/{1.73_m2} (ref >59)
Glucose: 108 mg/dL — ABNORMAL HIGH (ref 65–99)
POTASSIUM: 4.4 mmol/L (ref 3.5–5.2)
SODIUM: 140 mmol/L (ref 134–144)

## 2017-06-23 ENCOUNTER — Telehealth: Payer: Self-pay | Admitting: Internal Medicine

## 2017-06-23 ENCOUNTER — Encounter: Payer: Self-pay | Admitting: Internal Medicine

## 2017-06-23 ENCOUNTER — Ambulatory Visit (INDEPENDENT_AMBULATORY_CARE_PROVIDER_SITE_OTHER): Payer: Self-pay | Admitting: Internal Medicine

## 2017-06-23 VITALS — BP 118/86 | HR 75 | Resp 16 | Ht 68.0 in | Wt 282.0 lb

## 2017-06-23 DIAGNOSIS — G4719 Other hypersomnia: Secondary | ICD-10-CM

## 2017-06-23 NOTE — Patient Instructions (Addendum)
Will see if we can get you a sleep study and CPAP via [employer]. If not then we will need to wait until you get insurance, medicaid, etc.

## 2017-06-23 NOTE — Telephone Encounter (Signed)
The patient is aware of his results. 

## 2017-06-23 NOTE — Telephone Encounter (Signed)
Patient returning call for results 

## 2017-06-23 NOTE — Progress Notes (Signed)
[facility] Pulmonary Medicine Consultation      Assessment and Plan:  Excessive daytime sleepiness. - Symptoms and signs of obstructive sleep apnea. - We will send for sleep study, start on CPAP.  Patient does not have insurance so we will see if we can get this covered by [facility], discussed with patient that if they are not covered, then we will need to wait until he is covered under Medicaid.  He has applied and is waiting for a response.  Coronary artery disease, essential hypertension, GERD, stroke. -Above conditions can be contributed to by sleep apnea, therefore treatment of sleep apnea is important part of their management.   Date: 06/23/2017  MRN# [id number] Jon Garner. [birth year]  Referring Physician: NP Dunn.   Jon Garner. is a 52 y.o. old male seen in consultation for chief complaint of:    Chief Complaint  Patient presents with  . Consult    Patient was seen in hospital and referred for evaluation of possible sleep apnea:  . Snoring    HPI:   The patient is a 52 year old male, recently discharged from the hospital on 05/26/2017 for 1 day admission [facility] for chest pain and malignant hypertension/hypertensive emergency, and STEMI.  Patient has a history of GERD, left internal carotid artery occlusion, peripheral vascular disease, obesity.  He underwent cardiac catheterization which showed three-vessel disease.  He was then transferred to [facility] where he underwent four-vessel CABG on 05/29/2017, experienced a normal postoperative course, discharged on 06/03/2017.  He was suspected of having OSA and is referred here. He is sleepy during the day, he will nod off he is bored watching tv. He denies reflux or heartburn. No previous sleep study.    PMHX:   Past Medical History:  Diagnosis Date  . CAD (coronary artery disease)    a. LHC 4/19: pLAD 95%, mLAD 95%, m/dLAD 90%, ostD2 95%, pLCx 80%, mRCA 70%, dRCA 80%; b. 4-V CABG 05/2017: LIMA-LAD,  VG-D1, VG-PDA, VG-OM2  . Essential hypertension 2014  . GERD (gastroesophageal reflux disease) 2015  . Morbid obesity (HCC) presently  . PXE (pseudoxanthoma elasticum) 1992  . Snores presently  . Stroke [facility]) 1992   a. Age 56   Surgical Hx:  Past Surgical History:  Procedure Laterality Date  . CORONARY ARTERY BYPASS GRAFT N/A 05/29/2017   Procedure: CORONARY ARTERY BYPASS GRAFTING (CABG)  X 4 WITH OPEN HARVESTING OF LEFT AND RIGHT SAPHENOUS VEINS;  Surgeon: Loreli Slot, MD;  Location: [facility] OR;  Service: Open Heart Surgery;  Laterality: N/A;  . KNEE SURGERY Bilateral   . LEFT HEART CATH AND CORONARY ANGIOGRAPHY N/A 05/26/2017   Procedure: LEFT HEART CATH AND CORONARY ANGIOGRAPHY;  Surgeon: Antonieta Iba, MD;  Location: [facility] INVASIVE CV LAB;  Service: Cardiovascular;  Laterality: N/A;  . TEE WITHOUT CARDIOVERSION N/A 05/29/2017   Procedure: TRANSESOPHAGEAL ECHOCARDIOGRAM (TEE);  Surgeon: Loreli Slot, MD;  Location: [facility] OR;  Service: Open Heart Surgery;  Laterality: N/A;   Family Hx:  Family History  Problem Relation Age of Onset  . Dementia Mother   . Other Father        died in his 36's  . COPD Father   . Multiple sclerosis Sister    Social Hx:   Social History   Tobacco Use  . Smoking status: Former Smoker    Packs/day: 1.50    Years: 10.00    Pack years: 15.00    Types: Cigarettes  Last attempt to quit: 12/05/2013    Years since quitting: 3.5  . Smokeless tobacco: Never Used  Substance Use Topics  . Alcohol use: Not Currently  . Drug use: Never   Medication:    Current Outpatient Medications:  .  acetaminophen (TYLENOL) 325 MG tablet, Take 2 tablets (650 mg total) by mouth every 6 (six) hours as needed for mild pain., Disp: , Rfl:  .  amiodarone (PACERONE) 200 MG tablet, Please take 1 tab (200mg ) twice a day for 3 more days then take 1 tab (200mg ) once a day until we see you in follow-up., Disp: 40 tablet, Rfl: 1 .  apixaban (ELIQUIS) 5 MG TABS  tablet, Take 1 tablet (5 mg total) by mouth 2 (two) times daily., Disp: 180 tablet, Rfl: 3 .  aspirin EC 81 MG tablet, Take 81 mg by mouth once., Disp: , Rfl:  .  atorvastatin (LIPITOR) 80 MG tablet, Take 1 tablet (80 mg total) by mouth daily at 6 PM., Disp: 30 tablet, Rfl: 1 .  furosemide (LASIX) 40 MG tablet, Take 1 tablet (40 mg total) by mouth daily., Disp: 30 tablet, Rfl: 1 .  lisinopril (PRINIVIL,ZESTRIL) 40 MG tablet, Take 1 tablet (40 mg total) by mouth daily., Disp: 90 tablet, Rfl: 3 .  metoprolol tartrate (LOPRESSOR) 25 MG tablet, Take 1 tablet (25 mg total) by mouth 2 (two) times daily., Disp: 60 tablet, Rfl: 1 .  omeprazole (PRILOSEC) 20 MG capsule, Take 1 capsule (20 mg total) by mouth daily as needed (acid reflux.)., Disp: 30 capsule, Rfl: 1 .  oxyCODONE (OXY IR/ROXICODONE) 5 MG immediate release tablet, Take 1 tablet (5 mg total) by mouth every 6 (six) hours as needed for severe pain., Disp: 30 tablet, Rfl: 0 .  potassium chloride SA (K-DUR,KLOR-CON) 20 MEQ tablet, Take 1 tablet (20 mEq total) by mouth daily., Disp: 30 tablet, Rfl: 1 .  ranitidine (ZANTAC) 150 MG tablet, Take 150 mg by mouth 2 (two) times daily., Disp: , Rfl:    Allergies:  Penicillins  Review of Systems: Gen:  Denies  fever, sweats, chills HEENT: Denies blurred vision, double vision. bleeds, sore throat Cvc:  No dizziness, chest pain. Resp:   Denies cough or sputum production, shortness of breath Gi: Denies swallowing difficulty, stomach pain. Gu:  Denies bladder incontinence, burning urine Ext:   No Joint pain, stiffness. Skin: No skin rash,  hives  Endoc:  No polyuria, polydipsia. Psych: No depression, insomnia. Other:  All other systems were reviewed with the patient and were negative other that what is mentioned in the HPI.   Physical Examination:   VS: BP 118/86 (BP Location: Left Arm, Cuff Size: Large)   Pulse 75   Resp 16   Ht 5\' 8"  (1.727 m)   Wt 282 lb (127.9 kg)   SpO2 98%   BMI 42.88  kg/m   General Appearance: No distress  Neuro:without focal findings,  speech normal,  HEENT: PERRLA, EOM intact.  Mallampati 3 Pulmonary: normal breath sounds, No wheezing.  CardiovascularNormal S1,S2.  No m/r/g.   Abdomen: Benign, Soft, non-tender. Renal:  No costovertebral tenderness  GU:  No performed at this time. Endoc: No evident thyromegaly, no signs of acromegaly. Skin:   warm, no rashes, no ecchymosis  Extremities: normal, no cyanosis, clubbing.  Other findings:    LABORATORY PANEL:   CBC Recent Labs  Lab 06/19/17 1039  WBC 7.3  HGB 12.9*  HCT 38.3  PLT 507*   ------------------------------------------------------------------------------------------------------------------  Chemistries  Recent  Labs  Lab 06/19/17 1039  NA 140  K 4.4  CL 104  CO2 19*  GLUCOSE 108*  BUN 20  CREATININE 1.14  CALCIUM 9.1   ------------------------------------------------------------------------------------------------------------------  Cardiac Enzymes No results for input(s): TROPONINI in the last 168 hours. ------------------------------------------------------------  RADIOLOGY:  No results found.     Thank  you for the consultation and for allowing [facility] to assist in the care of your patient. Our recommendations are noted above.  Please contact us if we can be of further service.   Wells Guiles, MD.  Board Certified in Internal Medicine, Pulmonary Medicine, Critical Care Medicine, and Sleep Medicine.  [place] Office Number: [phone]  Santiago Glad, M.D.  Billy Fischer, M.D  06/23/2017

## 2017-06-25 ENCOUNTER — Other Ambulatory Visit: Payer: Self-pay | Admitting: Thoracic Surgery (Cardiothoracic Vascular Surgery)

## 2017-06-25 DIAGNOSIS — Z951 Presence of aortocoronary bypass graft: Secondary | ICD-10-CM

## 2017-06-26 ENCOUNTER — Telehealth: Payer: Self-pay

## 2017-06-26 NOTE — Telephone Encounter (Signed)
Irving Burton with Physical Therapy with [facility] called [phone] and stated that upon discharging Mr. Mauss from home health PT his right groin incision dehisced.  She stated that a week or so ago he stated that the incision did have a smell but did not notify anyone of change.  Upon viewing it today she noticed some mild serosanguinous fluid draining.  She stated that it was not warm to touch and no redness.  She said that the wound bed did have a yellowish/fatty tissue appearance to it.  He is not running a fever.  I advised that this is normal being the location of the incision.  I advised her to apply a dry dressing due to the drainage and keep it clean and dry.  She stated that she was going to get home health nursing to come out to the home to evaluate the wound.  To which I agreed.  Patient is to come into the office to see Dr. Dorris Fetch this coming Tuesday, May 28th.  Until then, I advised him to keep a watch on the wound and if any signs/ symptoms arise to give the office a call.  Otherwise we will wait for nursing to evaluate and see him at his follow-up appointment.

## 2017-07-01 ENCOUNTER — Other Ambulatory Visit: Payer: Self-pay | Admitting: *Deleted

## 2017-07-01 ENCOUNTER — Other Ambulatory Visit: Payer: Self-pay

## 2017-07-01 ENCOUNTER — Ambulatory Visit
Admission: RE | Admit: 2017-07-01 | Discharge: 2017-07-01 | Disposition: A | Discharge status: Home / Self Care | Payer: Self-pay | Source: Ambulatory Visit | Attending: Thoracic Surgery (Cardiothoracic Vascular Surgery) | Admitting: Thoracic Surgery (Cardiothoracic Vascular Surgery)

## 2017-07-01 ENCOUNTER — Ambulatory Visit (INDEPENDENT_AMBULATORY_CARE_PROVIDER_SITE_OTHER): Payer: Self-pay | Admitting: Thoracic Surgery (Cardiothoracic Vascular Surgery)

## 2017-07-01 ENCOUNTER — Encounter: Payer: Self-pay | Admitting: Thoracic Surgery (Cardiothoracic Vascular Surgery)

## 2017-07-01 VITALS — BP 125/75 | HR 84 | Resp 16 | Ht 68.0 in | Wt 276.4 lb

## 2017-07-01 DIAGNOSIS — Z951 Presence of aortocoronary bypass graft: Secondary | ICD-10-CM

## 2017-07-01 DIAGNOSIS — I214 Non-ST elevation (NSTEMI) myocardial infarction: Secondary | ICD-10-CM

## 2017-07-01 MED ORDER — LISINOPRIL 40 MG PO TABS: 40.0000 mg | ORAL_TABLET | Freq: Every day | ORAL | 3 refills | Status: DC

## 2017-07-01 NOTE — Progress Notes (Signed)
[address]       [name] [address]             [phone]    HPI: Jon Garner returns for scheduled postoperative follow-up  Jon Garner is a 52 year old gentleman with a history of hypertension, stroke, pseudoxanthoma elasticum, morbid obesity, and gastroesophageal reflux.  Presented with progressive chest pain and ruled in for non-ST elevation MI.  Cardiac catheterization revealed severe three-vessel coronary disease.  I think coronary artery bypass grafting x4 on 05/29/2017.  We were unable to harvest his vein endoscopically and ended up having to do open vein harvest from both thighs.  He had some atrial flutter intraoperatively and then had some SVT prior to discharge.  He did go home on amiodarone and apixaban.  Since discharge he has been doing well.  He had some serous drainage from the inferior aspect of his sternal wound which resolved after a day or 2.  More recently last week, he noticed some drainage from his right groin incision and that the skin had opened up.  He has not had any purulent drainage.  He denies any fevers, chills, or sweats.  He has not had any recurrent angina.  His exercise tolerance is improving.  He is lost about 25 pounds since the surgery.  Past Medical History:  Diagnosis Date  . CAD (coronary artery disease)    a. LHC 4/19: pLAD 95%, mLAD 95%, m/dLAD 90%, ostD2 95%, pLCx 80%, mRCA 70%, dRCA 80%; b. 4-V CABG 05/2017: LIMA-LAD, VG-D1, VG-PDA, VG-OM2  . Essential hypertension 2014  . GERD (gastroesophageal reflux disease) 2015  . Morbid obesity (HCC) presently  . PXE (pseudoxanthoma elasticum) 1992  . Snores presently  . Stroke [facility]) 1992   a. Age 21    Current Outpatient Medications  Medication Sig Dispense Refill  . acetaminophen (TYLENOL) 325 MG tablet Take 2 tablets (650 mg total) by mouth every 6 (six) hours as needed for mild pain.    Marland Kitchen amiodarone (PACERONE) 200 MG tablet Please take 1 tab (200mg ) twice a day for 3 more  days then take 1 tab (200mg ) once a day until we see you in follow-up. 40 tablet 1  . apixaban (ELIQUIS) 5 MG TABS tablet Take 1 tablet (5 mg total) by mouth 2 (two) times daily. 180 tablet 3  . aspirin EC 81 MG tablet Take 81 mg by mouth once.    Marland Kitchen atorvastatin (LIPITOR) 80 MG tablet Take 1 tablet (80 mg total) by mouth daily at 6 PM. 30 tablet 1  . furosemide (LASIX) 40 MG tablet Take 1 tablet (40 mg total) by mouth daily. 30 tablet 1  . lisinopril (PRINIVIL,ZESTRIL) 40 MG tablet Take 1 tablet (40 mg total) by mouth daily. 90 tablet 3  . metoprolol tartrate (LOPRESSOR) 25 MG tablet Take 1 tablet (25 mg total) by mouth 2 (two) times daily. 60 tablet 1  . omeprazole (PRILOSEC) 20 MG capsule Take 1 capsule (20 mg total) by mouth daily as needed (acid reflux.). 30 capsule 1  . potassium chloride SA (K-DUR,KLOR-CON) 20 MEQ tablet Take 1 tablet (20 mEq total) by mouth daily. 30 tablet 1  . ranitidine (ZANTAC) 150 MG tablet Take 150 mg by mouth 2 (two) times daily.    Marland Kitchen oxyCODONE (OXY IR/ROXICODONE) 5 MG immediate release tablet Take 1 tablet (5 mg total) by mouth every 6 (six) hours as needed for severe pain. 30 tablet 0   No current facility-administered medications for  this visit.     Physical Exam BP 125/75 (BP Location: Left Arm, Patient Position: Sitting, Cuff Size: Large)   Pulse 84   Resp 16   Ht 5\' 8"  (1.727 m)   Wt 276 lb 6.4 oz (125.4 kg)   SpO2 98% Comment: ON RA  BMI 42.03 kg/m  Obese 52 year old man in no acute distress Alert and oriented x3 with no focal deficits Lungs clear with equal breath sounds bilaterally Cardiac regular rate and rhythm normal S1 and S2 Sternal incision healing well, sternum stable Right groin incision dehiscence.  Small amount of serous fluid, no purulence, minimal erythema.  Diagnostic Tests: CHEST - 2 VIEW  COMPARISON:  06/02/2017  FINDINGS: Heart size upper normal. Negative for heart failure. Improved aeration of the lung bases since prior  study. Small bilateral pleural effusions. Negative for infiltrate or edema  IMPRESSION: Improved aeration of the lung bases. Small bilateral pleural effusions.   Electronically Signed   By: Marlan Palau M.D.   On: 07/01/2017 15:55 I personally reviewed the chest x-ray images and concur with the findings noted above.  Impression: Jon Garner is a 52 year old man with a history of pseudoxanthoma elasticum, stroke at age 52, hypertension, obesity, and reflux.  He presented with progressive chest pain and ruled in for non-ST elevation MI.  He underwent coronary bypass grafting x4 for severe three-vessel coronary disease on 05/29/2017.  He had some atrial flutter intraoperatively and SVT prior to discharge.  He did go home on amiodarone and apixaban.  We will plan to continue those for at least 6 to 8 weeks postop.  Right groin incision dehiscence-no evidence of infection.  Will treat with local wound care with normal saline wet-to-dry dressing changes daily.  Overall he is doing quite well.  He is not having much pain.  He is not taking any narcotics.  He may begin driving on a limited basis.  Appropriate precautions were discussed.  He should not lift anything over 10 pounds for another 2 weeks.  Plan: Normal saline wet-to-dry dressing changes to right groin daily Return in 3 weeks to check on groin wound  Loreli Slot, MD [facility] [phone]

## 2017-07-09 ENCOUNTER — Telehealth: Payer: Self-pay | Admitting: Physician Assistant

## 2017-07-09 NOTE — Telephone Encounter (Signed)
Fax received from [employer] that the patient has been approved for patient assistance for Eliquis from 06/20/17-06/21/18.

## 2017-07-22 ENCOUNTER — Encounter: Payer: Self-pay | Admitting: Thoracic Surgery (Cardiothoracic Vascular Surgery)

## 2017-07-22 ENCOUNTER — Ambulatory Visit (INDEPENDENT_AMBULATORY_CARE_PROVIDER_SITE_OTHER): Payer: Self-pay | Admitting: Thoracic Surgery (Cardiothoracic Vascular Surgery)

## 2017-07-22 ENCOUNTER — Other Ambulatory Visit: Payer: Self-pay

## 2017-07-22 VITALS — BP 120/73 | HR 65 | Resp 16 | Ht 68.0 in | Wt 278.8 lb

## 2017-07-22 DIAGNOSIS — Z951 Presence of aortocoronary bypass graft: Secondary | ICD-10-CM

## 2017-07-22 NOTE — Progress Notes (Signed)
[address]       [name] [address]             [phone]     HPI: Jon Garner returns for scheduled follow-up visit  He is a 52 year old gentleman with a past history of pseudoxanthoma elasticum, stroke, hypertension, and obesity.  He presented with a non-ST elevation MI.  He was found to have severe three-vessel coronary disease.  He underwent coronary artery bypass grafting x4 on 05/29/2017.  We had to do open vein harvest from both thighs.  He had atrial flutter and SVT and did go home on amiodarone and Eliquis.  He had some drainage from the inferior aspect of his sternal wound but that stopped after a couple of days.  Saw him in the office on 07/01/2017 after he has groin incision had opened up.  There was no significant infection and he was treated with saline wet-to-dry dressing changes.  He says that the wound has healed over completely.  He is not having any pain or swelling in the area.  He does not have any pain related to his sternal incision.  Overall he feels well..  Past Medical History:  Diagnosis Date  . CAD (coronary artery disease)    a. LHC 4/19: pLAD 95%, mLAD 95%, m/dLAD 90%, ostD2 95%, pLCx 80%, mRCA 70%, dRCA 80%; b. 4-V CABG 05/2017: LIMA-LAD, VG-D1, VG-PDA, VG-OM2  . Essential hypertension 2014  . GERD (gastroesophageal reflux disease) 2015  . Morbid obesity (HCC) presently  . PXE (pseudoxanthoma elasticum) 1992  . Snores presently  . Stroke [facility]) 1992   a. Age 19    Current Outpatient Medications  Medication Sig Dispense Refill  . acetaminophen (TYLENOL) 325 MG tablet Take 2 tablets (650 mg total) by mouth every 6 (six) hours as needed for mild pain.    Marland Kitchen amiodarone (PACERONE) 200 MG tablet Please take 1 tab (200mg ) twice a day for 3 more days then take 1 tab (200mg ) once a day until we see you in follow-up. 40 tablet 1  . apixaban (ELIQUIS) 5 MG TABS tablet Take 1 tablet (5 mg total) by mouth 2 (two) times daily. 180 tablet 3  .  aspirin EC 81 MG tablet Take 81 mg by mouth once.    Marland Kitchen atorvastatin (LIPITOR) 80 MG tablet Take 1 tablet (80 mg total) by mouth daily at 6 PM. 30 tablet 1  . furosemide (LASIX) 40 MG tablet Take 1 tablet (40 mg total) by mouth daily. 30 tablet 1  . lisinopril (PRINIVIL,ZESTRIL) 40 MG tablet Take 1 tablet (40 mg total) by mouth daily. 90 tablet 3  . metoprolol tartrate (LOPRESSOR) 25 MG tablet Take 1 tablet (25 mg total) by mouth 2 (two) times daily. 60 tablet 1  . omeprazole (PRILOSEC) 20 MG capsule Take 1 capsule (20 mg total) by mouth daily as needed (acid reflux.). 30 capsule 1  . potassium chloride SA (K-DUR,KLOR-CON) 20 MEQ tablet Take 1 tablet (20 mEq total) by mouth daily. 30 tablet 1  . ranitidine (ZANTAC) 150 MG tablet Take 150 mg by mouth 2 (two) times daily.     No current facility-administered medications for this visit.     Physical Exam BP 120/73 (BP Location: Right Arm, Patient Position: Sitting, Cuff Size: Large)   Pulse 65   Resp 16   Ht 5\' 8"  (1.727 m)   Wt 278 lb 12.8 oz (126.5 kg)   SpO2 98% Comment: ON RA  BMI  42.60 kg/m  52 year old man in no acute distress Alert and oriented x3 with no focal deficits Sternal incision well-healed, sternum stable Leg incisions well-healed  Diagnostic Tests: none  Impression: Jon Garner is a 52 year old gentleman with multiple cardiac risk factors who presented with a non-ST elevation MI.  He underwent coronary bypass grafting x4 for three-vessel coronary disease on 05/29/2017.  This point he is doing well.  He has minimal discomfort.  He is not requiring any narcotics.  His incisions are all healed at this point.  He remains on apixaban and amiodarone for perioperative arrhythmias.  He sees Dr. Okey Dupre next week, who will determine how long he needs to remain on his medications.  Plan: Follow-up as planned with cardiology.  I will be happy to see Jon Garner back at any time in the future if I can be of any further assistance  with his care  Loreli Slot, MD [facility] [phone]

## 2017-07-25 ENCOUNTER — Telehealth: Payer: Self-pay | Admitting: Family Medicine

## 2017-07-25 NOTE — Telephone Encounter (Signed)
Lauren from [facility] called to say they discharged Jon Garner yesterday from nursing care.  teri

## 2017-07-25 NOTE — Telephone Encounter (Signed)
Please review. Thanks!  

## 2017-07-28 ENCOUNTER — Other Ambulatory Visit: Payer: Self-pay | Admitting: Thoracic Surgery (Cardiothoracic Vascular Surgery)

## 2017-07-29 ENCOUNTER — Other Ambulatory Visit (INDEPENDENT_AMBULATORY_CARE_PROVIDER_SITE_OTHER): Payer: Self-pay

## 2017-07-29 ENCOUNTER — Other Ambulatory Visit: Payer: Self-pay

## 2017-07-29 DIAGNOSIS — I214 Non-ST elevation (NSTEMI) myocardial infarction: Secondary | ICD-10-CM

## 2017-07-29 DIAGNOSIS — Z951 Presence of aortocoronary bypass graft: Secondary | ICD-10-CM

## 2017-07-30 ENCOUNTER — Other Ambulatory Visit: Payer: Self-pay | Admitting: *Deleted

## 2017-07-30 DIAGNOSIS — I10 Essential (primary) hypertension: Secondary | ICD-10-CM

## 2017-07-30 DIAGNOSIS — Z79899 Other long term (current) drug therapy: Secondary | ICD-10-CM

## 2017-07-30 DIAGNOSIS — I214 Non-ST elevation (NSTEMI) myocardial infarction: Secondary | ICD-10-CM

## 2017-07-30 LAB — COMPREHENSIVE METABOLIC PANEL
A/G RATIO: 1.5 (ref 1.2–2.2)
ALT: 20 IU/L (ref 0–44)
AST: 22 IU/L (ref 0–40)
Albumin: 4 g/dL (ref 3.5–5.5)
Alkaline Phosphatase: 99 IU/L (ref 39–117)
BILIRUBIN TOTAL: 0.4 mg/dL (ref 0.0–1.2)
BUN/Creatinine Ratio: 16 (ref 9–20)
BUN: 25 mg/dL — ABNORMAL HIGH (ref 6–24)
CHLORIDE: 104 mmol/L (ref 96–106)
CO2: 20 mmol/L (ref 20–29)
Calcium: 8.8 mg/dL (ref 8.7–10.2)
Creatinine, Ser: 1.59 mg/dL — ABNORMAL HIGH (ref 0.76–1.27)
GFR calc Af Amer: 57 mL/min/{1.73_m2} — ABNORMAL LOW (ref >59)
GFR calc non Af Amer: 49 mL/min/{1.73_m2} — ABNORMAL LOW (ref >59)
Globulin, Total: 2.6 g/dL (ref 1.5–4.5)
Glucose: 108 mg/dL — ABNORMAL HIGH (ref 65–99)
POTASSIUM: 4.5 mmol/L (ref 3.5–5.2)
Sodium: 140 mmol/L (ref 134–144)
Total Protein: 6.6 g/dL (ref 6.0–8.5)

## 2017-07-30 LAB — LIPID PANEL
CHOL/HDL RATIO: 3.5 ratio (ref 0.0–5.0)
Cholesterol, Total: 91 mg/dL — ABNORMAL LOW (ref 100–199)
HDL: 26 mg/dL — AB (ref >39)
LDL CALC: 47 mg/dL (ref 0–99)
TRIGLYCERIDES: 91 mg/dL (ref 0–149)
VLDL Cholesterol Cal: 18 mg/dL (ref 5–40)

## 2017-07-30 MED ORDER — POTASSIUM CHLORIDE ER 10 MEQ PO TBCR: 10.0000 meq | EXTENDED_RELEASE_TABLET | Freq: Every day | ORAL | 3 refills | Status: DC

## 2017-07-30 MED ORDER — FUROSEMIDE 20 MG PO TABS: 20.0000 mg | ORAL_TABLET | Freq: Every day | ORAL | 3 refills | Status: DC

## 2017-08-03 ENCOUNTER — Other Ambulatory Visit: Payer: Self-pay | Admitting: Thoracic Surgery (Cardiothoracic Vascular Surgery)

## 2017-08-04 ENCOUNTER — Other Ambulatory Visit: Payer: Self-pay | Admitting: Thoracic Surgery (Cardiothoracic Vascular Surgery)

## 2017-08-06 ENCOUNTER — Encounter: Payer: Self-pay | Admitting: Internal Medicine

## 2017-08-06 ENCOUNTER — Ambulatory Visit (INDEPENDENT_AMBULATORY_CARE_PROVIDER_SITE_OTHER): Payer: Self-pay | Admitting: Internal Medicine

## 2017-08-06 VITALS — BP 114/62 | HR 99 | Ht 68.0 in | Wt 277.5 lb

## 2017-08-06 DIAGNOSIS — E785 Hyperlipidemia, unspecified: Secondary | ICD-10-CM

## 2017-08-06 DIAGNOSIS — N179 Acute kidney failure, unspecified: Secondary | ICD-10-CM

## 2017-08-06 DIAGNOSIS — Z79899 Other long term (current) drug therapy: Secondary | ICD-10-CM

## 2017-08-06 DIAGNOSIS — I10 Essential (primary) hypertension: Secondary | ICD-10-CM

## 2017-08-06 DIAGNOSIS — I5032 Chronic diastolic (congestive) heart failure: Secondary | ICD-10-CM

## 2017-08-06 DIAGNOSIS — I2510 Atherosclerotic heart disease of native coronary artery without angina pectoris: Secondary | ICD-10-CM

## 2017-08-06 DIAGNOSIS — I9789 Other postprocedural complications and disorders of the circulatory system, not elsewhere classified: Secondary | ICD-10-CM

## 2017-08-06 DIAGNOSIS — I4891 Unspecified atrial fibrillation: Secondary | ICD-10-CM

## 2017-08-06 MED ORDER — METOPROLOL TARTRATE 25 MG PO TABS: 25.0000 mg | ORAL_TABLET | Freq: Two times a day (BID) | ORAL | 1 refills | Status: DC

## 2017-08-06 MED ORDER — ATORVASTATIN CALCIUM 80 MG PO TABS: 80.0000 mg | ORAL_TABLET | Freq: Every day | ORAL | 1 refills | Status: DC

## 2017-08-06 MED ORDER — CLOPIDOGREL BISULFATE 75 MG PO TABS: 75.0000 mg | ORAL_TABLET | Freq: Every day | ORAL | 3 refills | Status: DC

## 2017-08-06 NOTE — Progress Notes (Signed)
Follow-up Outpatient Visit Date: 08/06/2017  Primary Care Provider: Maple Hudson., MD [address] [place] [address]   Chief Complaint: Follow-up coronary artery disease  HPI:  Mr. Frontino is a 52 y.o. year-old male with history of coronary artery disease status post CABG (05/29/2017) complicated by perioperative atrial fibrillation/flutter/SVT, remote stroke at age 25 felt to be due to pseudoxanthoma elasticum, hypertension, GERD, morbid obesity, and remote tobacco abuse, who presents for follow-up of coronary artery disease.  He presented to [facility] with chest pain in late April and underwent cardiac catheterization in the setting of an STEMI.  This revealed three-vessel disease prompting transfer to [facility] for CABG.  He was seen in mid May by Eula Listen, PA, at which time he was recovering from his surgery.  He noted some soreness at the sternotomy site but otherwise denied chest pain as well as shortness of breath.  He was continued on amiodarone and apixaban.  Today, Mr. Leak reports feeling well.  He notes some mild soreness along the inferior margin of his rib cage near his incisions.  He denies angina as well as shortness of breath, palpitations, and lightheadedness.  He has been sleeping better since his last visit.  He was unable to follow through with sleep study due to insurance issues.  He continues amiodarone and apixaban without bleeding or other side effects.  Due to recent acute kidney injury, he was advised to hold furosemide and potassium supplements for 2 days.  He has not had repeat labs since then.  --------------------------------------------------------------------------------------------------  Past Medical History:  Diagnosis Date  . CAD (coronary artery disease)    a. LHC 4/19: pLAD 95%, mLAD 95%, m/dLAD 90%, ostD2 95%, pLCx 80%, mRCA 70%, dRCA 80%; b. 4-V CABG 05/2017: LIMA-LAD, VG-D1, VG-PDA, VG-OM2  . Essential hypertension 2014  . GERD  (gastroesophageal reflux disease) 2015  . Morbid obesity (HCC) presently  . PXE (pseudoxanthoma elasticum) 1992  . Snores presently  . Stroke [facility]) 1992   a. Age 27   Past Surgical History:  Procedure Laterality Date  . CORONARY ARTERY BYPASS GRAFT N/A 05/29/2017   Procedure: CORONARY ARTERY BYPASS GRAFTING (CABG)  X 4 WITH OPEN HARVESTING OF LEFT AND RIGHT SAPHENOUS VEINS;  Surgeon: Loreli Slot, MD;  Location: [facility] OR;  Service: Open Heart Surgery;  Laterality: N/A;  . KNEE SURGERY Bilateral   . LEFT HEART CATH AND CORONARY ANGIOGRAPHY N/A 05/26/2017   Procedure: LEFT HEART CATH AND CORONARY ANGIOGRAPHY;  Surgeon: Antonieta Iba, MD;  Location: [facility] INVASIVE CV LAB;  Service: Cardiovascular;  Laterality: N/A;  . TEE WITHOUT CARDIOVERSION N/A 05/29/2017   Procedure: TRANSESOPHAGEAL ECHOCARDIOGRAM (TEE);  Surgeon: Loreli Slot, MD;  Location: [facility] OR;  Service: Open Heart Surgery;  Laterality: N/A;    Current Meds  Medication Sig  . acetaminophen (TYLENOL) 325 MG tablet Take 2 tablets (650 mg total) by mouth every 6 (six) hours as needed for mild pain.  Marland Kitchen aspirin EC 81 MG tablet Take 81 mg by mouth once.  Marland Kitchen atorvastatin (LIPITOR) 80 MG tablet Take 1 tablet (80 mg total) by mouth daily at 6 PM.  . furosemide (LASIX) 20 MG tablet Take 1 tablet (20 mg total) by mouth daily.  Marland Kitchen lisinopril (PRINIVIL,ZESTRIL) 40 MG tablet Take 1 tablet (40 mg total) by mouth daily.  . metoprolol tartrate (LOPRESSOR) 25 MG tablet Take 1 tablet (25 mg total) by mouth 2 (two) times daily.  Marland Kitchen omeprazole (PRILOSEC) 20 MG  capsule Take 1 capsule (20 mg total) by mouth daily as needed (acid reflux.).  Marland Kitchen potassium chloride (K-DUR) 10 MEQ tablet Take 1 tablet (10 mEq total) by mouth daily.  . potassium chloride SA (K-DUR,KLOR-CON) 20 MEQ tablet Take 1 tablet (20 mEq total) by mouth daily.  . ranitidine (ZANTAC) 150 MG tablet Take 150 mg by mouth 2 (two) times daily.  . [DISCONTINUED] amiodarone (PACERONE)  200 MG tablet Please take 1 tab (200mg ) twice a day for 3 more days then take 1 tab (200mg ) once a day until we see you in follow-up.  . [DISCONTINUED] apixaban (ELIQUIS) 5 MG TABS tablet Take 1 tablet (5 mg total) by mouth 2 (two) times daily.  . [DISCONTINUED] atorvastatin (LIPITOR) 80 MG tablet Take 1 tablet (80 mg total) by mouth daily at 6 PM.  . [DISCONTINUED] metoprolol tartrate (LOPRESSOR) 25 MG tablet Take 1 tablet (25 mg total) by mouth 2 (two) times daily.    Allergies: Penicillins  Social History   Tobacco Use  . Smoking status: Former Smoker    Packs/day: 1.50    Years: 10.00    Pack years: 15.00    Types: Cigarettes    Last attempt to quit: 12/05/2013    Years since quitting: 3.6  . Smokeless tobacco: Never Used  Substance Use Topics  . Alcohol use: Not Currently  . Drug use: Never    Family History  Problem Relation Age of Onset  . Dementia Mother   . Other Father        died in his 34's  . COPD Father   . Multiple sclerosis Sister     Review of Systems: A 12-system review of systems was performed and was negative except as noted in the HPI.  --------------------------------------------------------------------------------------------------  Physical Exam: BP 114/62 (BP Location: Left Arm)   Pulse 99   Ht 5\' 8"  (1.727 m)   Wt 277 lb 8 oz (125.9 kg)   BMI 42.19 kg/m   Repeat BP: 114/62  General: NAD. HEENT: No conjunctival pallor or scleral icterus. Moist mucous membranes.  OP clear. Neck: Supple without lymphadenopathy, thyromegaly, JVD, or HJR. Lungs: Normal work of breathing. Clear to auscultation bilaterally without wheezes or crackles. Heart: Regular rate and rhythm without murmurs, rubs, or gallops.  Healed median sternotomy.  Unable to assess PMI due to body habitus. Abd: Bowel sounds present. Soft, NT/ND.  Unable to assess HSM due to body habitus. Ext: No lower extremity edema. Radial, PT, and DP pulses are 2+ bilaterally. Skin: Warm and dry  without rash.   Lab Results  Component Value Date   WBC 7.3 06/19/2017   HGB 12.9 (L) 06/19/2017   HCT 38.3 06/19/2017   MCV 86 06/19/2017   PLT 507 (H) 06/19/2017    Lab Results  Component Value Date   NA 140 07/29/2017   K 4.5 07/29/2017   CL 104 07/29/2017   CO2 20 07/29/2017   BUN 25 (H) 07/29/2017   CREATININE 1.59 (H) 07/29/2017   GLUCOSE 108 (H) 07/29/2017   ALT 20 07/29/2017    Lab Results  Component Value Date   CHOL 91 (L) 07/29/2017   HDL 26 (L) 07/29/2017   LDLCALC 47 07/29/2017   TRIG 91 07/29/2017   CHOLHDL 3.5 07/29/2017    --------------------------------------------------------------------------------------------------  ASSESSMENT AND PLAN: Coronary artery disease without angina Mr. Cassin is recovering well from his urgent CABG in late April.  He has not had any further angina no shortness of breath.  We will  complete 3 months of aspirin and apixaban from the time of his surgery, after which time we will discontinue apixaban and then begin compatible 75 mg daily.  Given that he presented with acute coronary syndrome, I recommend at least 12 months of dual antiplatelet therapy.  We will also continue with high intensity statin therapy for secondary prevention.  Mr. Titsworth was encouraged to participate in cardiac rehab.  Postoperative atrial fibrillation/flutter/SVT No symptoms to suggest recurrence.  Heart rate is regular today.  EKG last month showed sinus rhythm.  We have agreed to complete 3 months of amiodarone and apixaban, after which time we will discontinue these medications.  He should remain on metoprolol tartrate 25 mg BID.  Hypertension Blood pressure is well-controlled today.  Continue current doses of metoprolol and lisinopril.  Hyperlipidemia LDL at goal (<70).  Continue atorvastatin 80 mg daily.  HFpEF Mr. Tuggle appears euvolemic on exam today.  He was recently instructed to hold furosemide and potassium chloride due to AKI.  I will  recheck a BMP today.  Based on results, it may be reasonable for him to use these medications only on an as-needed basis for weight and and/or edema.  Acute kidney injury Recent AKI noted, felt to be due to overdiuresis.  Furosemide and KCl were held x 2 days.  We will recheck a BMP today.  Follow-up: Return to clinic in 3 months.  Yvonne Kendall, MD 08/07/2017 10:44 PM

## 2017-08-06 NOTE — Patient Instructions (Addendum)
Medication Instructions: On July 28th stop:  Amiodarone  Eliquis On August 1st start:  Clopidogrel (Plavix) 75 mg daily  If you need a refill on your cardiac medications before your next appointment, please call your pharmacy.   Labwork: Your provider would like for you to have the following labs today: BMET   Follow-Up: Your physician wants you to follow-up in 3 months with Dr. Okey Dupre   Thank you for choosing [facility] at [facility]!

## 2017-08-07 ENCOUNTER — Encounter: Payer: Self-pay | Admitting: Internal Medicine

## 2017-08-07 DIAGNOSIS — N179 Acute kidney failure, unspecified: Secondary | ICD-10-CM | POA: Insufficient documentation

## 2017-08-07 DIAGNOSIS — I5032 Chronic diastolic (congestive) heart failure: Secondary | ICD-10-CM | POA: Insufficient documentation

## 2017-08-07 DIAGNOSIS — I9789 Other postprocedural complications and disorders of the circulatory system, not elsewhere classified: Secondary | ICD-10-CM

## 2017-08-07 DIAGNOSIS — I2510 Atherosclerotic heart disease of native coronary artery without angina pectoris: Secondary | ICD-10-CM | POA: Insufficient documentation

## 2017-08-07 DIAGNOSIS — E785 Hyperlipidemia, unspecified: Secondary | ICD-10-CM | POA: Insufficient documentation

## 2017-08-07 DIAGNOSIS — I4891 Unspecified atrial fibrillation: Secondary | ICD-10-CM | POA: Insufficient documentation

## 2017-08-07 DIAGNOSIS — I10 Essential (primary) hypertension: Secondary | ICD-10-CM | POA: Insufficient documentation

## 2017-09-04 ENCOUNTER — Ambulatory Visit: Payer: Self-pay | Admitting: Internal Medicine

## 2017-09-18 ENCOUNTER — Telehealth: Payer: Self-pay | Admitting: Internal Medicine

## 2017-09-18 NOTE — Telephone Encounter (Signed)
Received records request Disability Determination Services, forwarded to [employer] for processing.  

## 2017-10-17 DIAGNOSIS — Z736 Limitation of activities due to disability: Secondary | ICD-10-CM

## 2017-11-05 ENCOUNTER — Ambulatory Visit (INDEPENDENT_AMBULATORY_CARE_PROVIDER_SITE_OTHER): Payer: Self-pay | Admitting: Internal Medicine

## 2017-11-05 ENCOUNTER — Encounter: Payer: Self-pay | Admitting: Internal Medicine

## 2017-11-05 VITALS — BP 100/68 | HR 69 | Ht 68.0 in | Wt 281.2 lb

## 2017-11-05 DIAGNOSIS — I4891 Unspecified atrial fibrillation: Secondary | ICD-10-CM

## 2017-11-05 DIAGNOSIS — I5032 Chronic diastolic (congestive) heart failure: Secondary | ICD-10-CM

## 2017-11-05 DIAGNOSIS — M79605 Pain in left leg: Secondary | ICD-10-CM

## 2017-11-05 DIAGNOSIS — I10 Essential (primary) hypertension: Secondary | ICD-10-CM

## 2017-11-05 DIAGNOSIS — E785 Hyperlipidemia, unspecified: Secondary | ICD-10-CM

## 2017-11-05 DIAGNOSIS — Z23 Encounter for immunization: Secondary | ICD-10-CM

## 2017-11-05 DIAGNOSIS — I9789 Other postprocedural complications and disorders of the circulatory system, not elsewhere classified: Secondary | ICD-10-CM

## 2017-11-05 DIAGNOSIS — I2510 Atherosclerotic heart disease of native coronary artery without angina pectoris: Secondary | ICD-10-CM

## 2017-11-05 DIAGNOSIS — M79604 Pain in right leg: Secondary | ICD-10-CM

## 2017-11-05 NOTE — Patient Instructions (Signed)
Medication Instructions:  Your physician recommends that you continue on your current medications as directed. Please refer to the Current Medication list given to you today.   Labwork: Your physician recommends that you return for lab work in: TODAY - BMET.   Testing/Procedures: none  Follow-Up: Your physician recommends that you schedule a follow-up appointment in: 6 MONTHS WITH DR END.    Any Other Special Instructions Will Be Listed Below (If Applicable).  You can get Mederma over-the-counter to put on your incision.   If you need a refill on your cardiac medications before your next appointment, please call your pharmacy.

## 2017-11-05 NOTE — Progress Notes (Signed)
Follow-up Outpatient Visit Date: 11/05/2017  Primary Care Provider: Maple Hudson., MD [address] [place] [address]  Chief Complaint: Coronary artery disease  HPI:  Jon Garner is a 52 y.o. 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  I last saw him in July, at which time he was doing well other than soreness along the inferior margin of his rib cage near incisions made at the time of CABG.  He remained on amiodarone and apixaban.  We agreed to switch from aspirin and apixaban to aspirin and clopidogrel to complete 12 months of DAPT, given ACS presentation.  No further atrial fibrillation had been identified.  Amiodarone was also discontinued after 3 months of therapy.  Due to preceding AKI, we agreed to recheck labs.  However, adequate blood sample could not be obtained at that time.  He has not had any labs since then.  Today, Jon Garner reports that he is feeling quite well.  His main limiting factor is pain in his knees and calves with walking.  He is able to walk about 200 yards before needing to stop.  He attributes this to prior knee surgery.  He does not have any rest pain.  Jon Garner denies chest pain, shortness of breath, palpitations, lightheadedness, orthopnea, and edema.  We had previously discussed referral for sleep study, though he will has not been able to proceed with this due to financial constraints.  Reports being compliant with his medications.  Jon Garner has noted some soreness along his median sternotomy incision and also feels as though the incision has become elevated.  Walking some.  Limited by knee and calf pain walking 200 yards.  Never had sleep study due to lack  of insurance.  --------------------------------------------------------------------------------------------------  Past Medical History:  Diagnosis Date  . CAD (coronary artery disease)    a. LHC 4/19: pLAD 95%, mLAD 95%, m/dLAD 90%, ostD2 95%, pLCx 80%, mRCA 70%, dRCA 80%; b. 4-V CABG 05/2017: LIMA-LAD, VG-D1, VG-PDA, VG-OM2  . Essential hypertension 2014  . GERD (gastroesophageal reflux disease) 2015  . Morbid obesity (HCC) presently  . PXE (pseudoxanthoma elasticum) 1992  . Snores presently  . Stroke [facility]) 1992   a. Age 35   Past Surgical History:  Procedure Laterality Date  . CORONARY ARTERY BYPASS GRAFT N/A 05/29/2017   Procedure: CORONARY ARTERY BYPASS GRAFTING (CABG)  X 4 WITH OPEN HARVESTING OF LEFT AND RIGHT SAPHENOUS VEINS;  Surgeon: Loreli Slot, MD;  Location: [facility] OR;  Service: Open Heart Surgery;  Laterality: N/A;  . KNEE SURGERY Bilateral   . LEFT HEART CATH AND CORONARY ANGIOGRAPHY N/A 05/26/2017   Procedure: LEFT HEART CATH AND CORONARY ANGIOGRAPHY;  Surgeon: Antonieta Iba, MD;  Location: [facility] INVASIVE CV LAB;  Service: Cardiovascular;  Laterality: N/A;  . TEE WITHOUT CARDIOVERSION N/A 05/29/2017   Procedure: TRANSESOPHAGEAL ECHOCARDIOGRAM (TEE);  Surgeon: Loreli Slot, MD;  [facility];  Service: Open Heart Surgery;  Laterality: N/A;    Current Meds  Medication Sig  . aspirin EC 81 MG tablet Take 81 mg by mouth once.  Marland Kitchen atorvastatin (LIPITOR) 80 MG tablet Take 1 tablet (80 mg total) by mouth daily at 6 PM.  . clopidogrel (PLAVIX) 75 MG tablet Take 1 tablet (75  mg total) by mouth daily.  . furosemide (LASIX) 20 MG tablet Take 1 tablet (20 mg total) by mouth daily.  Marland Kitchen lisinopril (PRINIVIL,ZESTRIL) 40 MG tablet Take 1 tablet (40 mg total) by mouth daily.  . metoprolol tartrate (LOPRESSOR) 25 MG tablet Take 1 tablet (25 mg total) by mouth 2 (two) times daily.  . potassium chloride (K-DUR) 10 MEQ tablet Take 1 tablet (10 mEq total) by mouth daily.      Allergies: Penicillins  Social History   Tobacco Use  . Smoking status: Former Smoker    Packs/day: 1.50    Years: 10.00    Pack years: 15.00    Types: Cigarettes    Last attempt to quit: 12/05/2013    Years since quitting: 3.9  . Smokeless tobacco: Never Used  Substance Use Topics  . Alcohol use: Not Currently  . Drug use: Never    Family History  Problem Relation Age of Onset  . Dementia Mother   . Other Father        died in his 44's  . COPD Father   . Multiple sclerosis Sister     Review of Systems: A 12-system review of systems was performed and was negative except as noted in the HPI.  --------------------------------------------------------------------------------------------------  Physical Exam: BP 100/68 (BP Location: Left Arm, Patient Position: Sitting, Cuff Size: Large)   Pulse 69   Ht 5\' 8"  (1.727 m)   Wt 281 lb 4 oz (127.6 kg)   BMI 42.76 kg/m   General: NAD. HEENT: No conjunctival pallor or scleral icterus. Moist mucous membranes.  OP clear. Neck: Supple without lymphadenopathy, thyromegaly, JVD, or HJR. Lungs: Normal work of breathing. Clear to auscultation bilaterally without wheezes or crackles. Heart: Regular rate and rhythm without murmurs, rubs, or gallops.  Unable to assess PMI due to body habitus. Abd: Bowel sounds present. Soft, NT/ND without hepatosplenomegaly Ext: 1+ pretibial edema bilaterally.  2+ pedal pulses bilaterally. Skin: Median sternotomy incision is well-healed with prominent granulation tissue and keloid formation.  EKG: Normal sinus rhythm with inferior Q waves.  Lab Results  Component Value Date   WBC 7.3 06/19/2017   HGB 12.9 (L) 06/19/2017   HCT 38.3 06/19/2017   MCV 86 06/19/2017   PLT 507 (H) 06/19/2017    Lab Results  Component Value Date   NA 140 11/05/2017   K 4.3 11/05/2017   CL 105 11/05/2017   CO2 21 11/05/2017   BUN 16 11/05/2017   CREATININE 1.22 11/05/2017   GLUCOSE 91 11/05/2017   ALT 20  07/29/2017    Lab Results  Component Value Date   CHOL 91 (L) 07/29/2017   HDL 26 (L) 07/29/2017   LDLCALC 47 07/29/2017   TRIG 91 07/29/2017   CHOLHDL 3.5 07/29/2017    --------------------------------------------------------------------------------------------------  ASSESSMENT AND PLAN: Coronary artery disease without angina Jon Garner has recovered well from his CABG.  He does not have symptoms of unstable coronary disease.  We will continue 12 months of aspirin and clopidogrel, given ACS presentation leading up to CABG.  We will also continue with aggressive secondary prevention.  I encouraged Mr. Sprunk to exercise as tolerated.  Raised sternotomy incision demonstrates prominent granulation tissue.  He may have some degree of keloid formation.  I have suggested he apply Mederma to the wound to see if this helps.  Postoperative atrial fibrillation No symptoms to suggest recurrence.  EKG today shows sinus rhythm.  Patient discontinued apixaban and amiodarone after our last visit.  We will  continue to hold these medications.  Leg pain Most likely musculoskeletal, though calf discomfort with ambulation raises the possibility of claudication.  Pedal pulses are palpable on exam today.  Pre-CABG vascular studies noted triphasic waveforms in the PT/DP arteries prior to CABG, though ABI's are not reported.  We discussed performing ABIs but have agreed to defer this as Mr. Alessio does not have clinical evidence of critical limb ischemia.  I encouraged him to increase his walking, as tolerated.  Hyperlipidemia LDL well controlled on last check.  Continue high intensity statin therapy.  HFpEF Mr. Pons has mild lower extremity edema but otherwise appears grossly euvolemic.  I will recheck a basic metabolic panel today, given his acute kidney injury this summer.  We will continue furosemide 20 mg daily.  I encouraged sodium restriction.  Hypertension Blood pressure low normal today but without  symptoms.  Continue lisinopril and metoprolol.  Influenza immunization Seasonal flu vaccine administered today at the patient's request.  Follow-up: Return to clinic in 6 months.  Yvonne Kendall, MD 11/06/2017 7:05 AM

## 2017-11-06 ENCOUNTER — Encounter: Payer: Self-pay | Admitting: Internal Medicine

## 2017-11-06 DIAGNOSIS — M79605 Pain in left leg: Secondary | ICD-10-CM

## 2017-11-06 DIAGNOSIS — M79604 Pain in right leg: Secondary | ICD-10-CM | POA: Insufficient documentation

## 2017-11-06 LAB — BASIC METABOLIC PANEL
BUN/Creatinine Ratio: 13 (ref 9–20)
BUN: 16 mg/dL (ref 6–24)
CALCIUM: 9 mg/dL (ref 8.7–10.2)
CHLORIDE: 105 mmol/L (ref 96–106)
CO2: 21 mmol/L (ref 20–29)
Creatinine, Ser: 1.22 mg/dL (ref 0.76–1.27)
GFR calc non Af Amer: 68 mL/min/{1.73_m2} (ref >59)
GFR, EST AFRICAN AMERICAN: 78 mL/min/{1.73_m2} (ref >59)
Glucose: 91 mg/dL (ref 65–99)
POTASSIUM: 4.3 mmol/L (ref 3.5–5.2)
Sodium: 140 mmol/L (ref 134–144)

## 2017-12-22 ENCOUNTER — Telehealth: Payer: Self-pay | Admitting: Internal Medicine

## 2017-12-22 NOTE — Telephone Encounter (Signed)
Patient verbalized understanding of Dr Serita Kyle recommendations and was very appreciative.

## 2017-12-22 NOTE — Telephone Encounter (Signed)
This will be up to the [employer] or blood collection service.  However, I suspect that his medications will preclude him from donating.  I would prefer him waiting to donate until he is at least 1 year out from his CABG.  Yvonne Kendall, MD [facility] Pager: [phone]

## 2017-12-22 NOTE — Telephone Encounter (Signed)
Please call and advise if pt is able to give blood.

## 2017-12-22 NOTE — Telephone Encounter (Signed)
Routing to Dr End for advice.  

## 2018-02-16 ENCOUNTER — Telehealth: Payer: Self-pay | Admitting: Internal Medicine

## 2018-02-16 ENCOUNTER — Telehealth: Payer: Self-pay | Admitting: Family Medicine

## 2018-02-16 MED ORDER — OMEPRAZOLE 20 MG PO CPDR: 20.0000 mg | DELAYED_RELEASE_CAPSULE | Freq: Every day | ORAL | 3 refills | Status: DC

## 2018-02-16 NOTE — Telephone Encounter (Signed)
Pt wanting to get back on metoprolol tartrate (LOPRESSOR) 25 MG tablet.  He is now having some heartburn.  Please fill at:  [facility] #[id number] [place], [place] - [address] AT [facility] OF [address] [phone] (Phone) [phone] (Fax)   Thanks, [facility]

## 2018-02-16 NOTE — Telephone Encounter (Signed)
°*  STAT* If patient is at the pharmacy, call can be transferred to refill team.   1. Which medications need to be refilled? (please list name of each medication and dose if known) Omeprazole 20 MG  2. Which pharmacy/location (including street and city if local pharmacy) is medication to be sent to? [facility] [address] and [place]   3. Do they need a 30 day or 90 day supply? 90 day   Patient states he has not been on it for a while but would like to know if he can start again. Please advise.

## 2018-02-16 NOTE — Telephone Encounter (Signed)
Patient states he was prescribed omeprazole after leaving the hospital. States he had stopped taking it but would like another prescription to have it when he needs it. Advised that he should call his PCP for ongoing prescription. He verbalized understanding.

## 2018-02-16 NOTE — Telephone Encounter (Signed)
Patient meant Omeprazole.

## 2018-03-09 ENCOUNTER — Other Ambulatory Visit: Payer: Self-pay | Admitting: Internal Medicine

## 2018-04-22 ENCOUNTER — Telehealth: Payer: Self-pay

## 2018-04-22 NOTE — Telephone Encounter (Signed)
Left a message on patient's home answering machine the Eliquis patient assistance foundation forms are available to pick up. Forms placed at the front desk.

## 2018-05-15 ENCOUNTER — Other Ambulatory Visit: Payer: Self-pay | Admitting: *Deleted

## 2018-05-15 MED ORDER — LISINOPRIL 40 MG PO TABS: 40.0000 mg | ORAL_TABLET | Freq: Every day | ORAL | 0 refills | Status: DC

## 2018-05-15 MED ORDER — CLOPIDOGREL BISULFATE 75 MG PO TABS: 75.0000 mg | ORAL_TABLET | Freq: Every day | ORAL | 0 refills | Status: DC

## 2018-05-15 MED ORDER — ATORVASTATIN CALCIUM 80 MG PO TABS: ORAL_TABLET | ORAL | 0 refills | Status: DC

## 2018-05-15 MED ORDER — METOPROLOL TARTRATE 25 MG PO TABS: 25.0000 mg | ORAL_TABLET | Freq: Two times a day (BID) | ORAL | 0 refills | Status: DC

## 2018-05-20 ENCOUNTER — Other Ambulatory Visit: Payer: Self-pay | Admitting: Family Medicine

## 2018-05-20 MED ORDER — OMEPRAZOLE 20 MG PO CPDR: 20.0000 mg | DELAYED_RELEASE_CAPSULE | Freq: Every day | ORAL | 0 refills | Status: DC

## 2018-05-20 NOTE — Telephone Encounter (Signed)
[facility] faxed refill request for the following medications: ° °omeprazole (PRILOSEC) 20 MG capsule ° ° °Please advise. °

## 2018-06-08 ENCOUNTER — Other Ambulatory Visit: Payer: Self-pay | Admitting: Family Medicine

## 2018-06-16 ENCOUNTER — Telehealth: Payer: Self-pay

## 2018-06-16 NOTE — Telephone Encounter (Signed)
Before I call the patient, will it be ok to have meds refilled before his new patient appt? Please advise. Thanks!

## 2018-06-16 NOTE — Telephone Encounter (Signed)
For 1 month but no controlled meds.

## 2018-06-16 NOTE — Telephone Encounter (Signed)
Patient called office requesting CMA give him a call back, he states that Dr. Sullivan Lone agreed to take his brother as a new patient and states that he has questions/concerns about getting prescriptions filled prior to appointment. He states that he will be accompanying patient at his appointment due to his disability. KW

## 2018-07-06 ENCOUNTER — Telehealth: Payer: Self-pay

## 2018-07-06 NOTE — Telephone Encounter (Signed)
Received request from patient to schedule appointment with Dr. Okey Dupre. Left voicemail message requesting for patient to call back to schedule.

## 2018-07-07 ENCOUNTER — Telehealth: Payer: Self-pay

## 2018-07-07 NOTE — Telephone Encounter (Signed)
Second call attempted to schedule appointment for patient at his request through My Chart. Left voicemail message requesting for patient to call back to schedule.

## 2018-07-08 ENCOUNTER — Telehealth: Payer: Self-pay

## 2018-07-08 NOTE — Telephone Encounter (Signed)
Virtual Visit Pre-Appointment Phone Call  "Jon Garner, I am calling you today to discuss your upcoming appointment. We are currently trying to limit exposure to the virus that causes COVID-19 by seeing patients at home rather than in the office."  1. "What is the BEST phone number to call the day of the visit?" - include this in appointment notes  2. "Do you have or have access to (through a family member/friend) a smartphone with video capability that we can use for your visit?" a. If yes - list this number in appt notes as "cell" (if different from BEST phone #) and list the appointment type as a VIDEO visit in appointment notes b. If no - list the appointment type as a PHONE visit in appointment notes  3. Confirm consent - "In the setting of the current Covid19 crisis, you are scheduled for a video visit with your provider on 07/22/2018 at 9:30AM.  Just as we do with many in-office visits, in order for you to participate in this visit, we must obtain consent.  If you'd like, I can send this to your mychart (if signed up) or email for you to review.  Otherwise, I can obtain your verbal consent now.  All virtual visits are billed to your insurance company just like a normal visit would be.  By agreeing to a virtual visit, we'd like you to understand that the technology does not allow for your provider to perform an examination, and thus may limit your provider's ability to fully assess your condition. If your provider identifies any concerns that need to be evaluated in person, we will make arrangements to do so.  Finally, though the technology is pretty good, we cannot assure that it will always work on either your or our end, and in the setting of a video visit, we may have to convert it to a phone-only visit.  In either situation, we cannot ensure that we have a secure connection.  Are you willing to proceed?" STAFF: Did the patient verbally acknowledge consent to telehealth visit? Document YES/NO  here: YES  4. Advise patient to be prepared - "Two hours prior to your appointment, go ahead and check your blood pressure, pulse, oxygen saturation, and your weight (if you have the equipment to check those) and write them all down. When your visit starts, your provider will ask you for this information. If you have an Apple Watch or Kardia device, please plan to have heart rate information ready on the day of your appointment. Please have a pen and paper handy nearby the day of the visit as well."  5. Give patient instructions for MyChart download to smartphone OR Doximity/Doxy.me as below if video visit (depending on what platform provider is using)  6. Inform patient they will receive a phone call 15 minutes prior to their appointment time (may be from unknown caller ID) so they should be prepared to answer    TELEPHONE CALL NOTE  Jon Love. has been deemed a candidate for a follow-up tele-health visit to limit community exposure during the Covid-19 pandemic. I spoke with the patient via phone to ensure availability of phone/video source, confirm preferred email & phone number, and discuss instructions and expectations.  I reminded Jon Love. to be prepared with any vital sign and/or heart rhythm information that could potentially be obtained via home monitoring, at the time of his visit. I reminded Jon Love. to expect a phone call prior  to his visit.  Tommie Sams McClain 07/08/2018 10:06 AM   INSTRUCTIONS FOR DOWNLOADING THE MYCHART APP TO SMARTPHONE  - The patient must first make sure to have activated MyChart and know their login information - If Apple, go to [employer] and type in MyChart in the search bar and download the app. If Android, ask patient to go to [employer] and type in [facility] in the search bar and download the app. The app is free but as with any other app downloads, their phone may require them to verify saved payment information or  Apple/Android password.  - The patient will need to then log into the app with their MyChart username and password, and select [facility] as their healthcare provider to link the account. When it is time for your visit, go to the MyChart app, find appointments, and click Begin Video Visit. Be sure to Select Allow for your device to access the Microphone and Camera for your visit. You will then be connected, and your provider will be with you shortly.  **If they have any issues connecting, or need assistance please contact MyChart service desk [phone])**  **If using a computer, in order to ensure the best quality for their visit they will need to use either of the following Internet Browsers: [employer] or Google Chrome**  IF USING DOXIMITY or DOXY.ME - The patient will receive a link just prior to their visit by text.     FULL LENGTH CONSENT FOR TELE-HEALTH VISIT   I hereby voluntarily request, consent and authorize [facility] and its employed or contracted physicians, physician assistants, nurse practitioners or other licensed health care professionals (the Practitioner), to provide me with telemedicine health care services (the "Services") as deemed necessary by the treating Practitioner. I acknowledge and consent to receive the Services by the Practitioner via telemedicine. I understand that the telemedicine visit will involve communicating with the Practitioner through live audiovisual communication technology and the disclosure of certain medical information by electronic transmission. I acknowledge that I have been given the opportunity to request an in-person assessment or other available alternative prior to the telemedicine visit and am voluntarily participating in the telemedicine visit.  I understand that I have the right to withhold or withdraw my consent to the use of telemedicine in the course of my care at any time, without affecting my right to future care  or treatment, and that the Practitioner or I may terminate the telemedicine visit at any time. I understand that I have the right to inspect all information obtained and/or recorded in the course of the telemedicine visit and may receive copies of available information for a reasonable fee.  I understand that some of the potential risks of receiving the Services via telemedicine include:  Marland Kitchen Delay or interruption in medical evaluation due to technological equipment failure or disruption; . Information transmitted may not be sufficient (e.g. poor resolution of images) to allow for appropriate medical decision making by the Practitioner; and/or  . In rare instances, security protocols could fail, causing a breach of personal health information.  Furthermore, I acknowledge that it is my responsibility to provide information about my medical history, conditions and care that is complete and accurate to the best of my ability. I acknowledge that Practitioner's advice, recommendations, and/or decision may be based on factors not within their control, such as incomplete or inaccurate data provided by me or distortions of diagnostic images or specimens that may result from electronic  transmissions. I understand that the practice of medicine is not an exact science and that Practitioner makes no warranties or guarantees regarding treatment outcomes. I acknowledge that I will receive a copy of this consent concurrently upon execution via email to the email address I last provided but may also request a printed copy by calling the office of [facility].    I understand that my insurance will be billed for this visit.   I have read or had this consent read to me. . I understand the contents of this consent, which adequately explains the benefits and risks of the Services being provided via telemedicine.  . I have been provided ample opportunity to ask questions regarding this consent and the Services and have had  my questions answered to my satisfaction. . I give my informed consent for the services to be provided through the use of telemedicine in my medical care  By participating in this telemedicine visit I agree to the above.

## 2018-07-21 NOTE — Progress Notes (Signed)
Virtual Visit via Video Note   This visit type was conducted due to national recommendations for restrictions regarding the COVID-19 Pandemic (e.g. social distancing) in an effort to limit this patient's exposure and mitigate transmission in our community.  Due to his co-morbid illnesses, this patient is at least at moderate risk for complications without adequate follow up.  This format is felt to be most appropriate for this patient at this time.  All issues noted in this document were discussed and addressed.  A limited physical exam was performed with this format.  Please refer to the patient's chart for his consent to telehealth for [facility].   Date:  07/22/2018   ID:  Jon Love., DOB [birth year], MRN [id number]  Patient Location: Home Provider Location: Office  PCP:  Maple Hudson., MD  Cardiologist:  Yvonne Kendall, MD Electrophysiologist:  None   Evaluation Performed:  Follow-Up Visit  Chief Complaint: Follow-up coronary artery disease  History of Present Illness:    Jon Shafiq. is a 53 y.o. 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  I last saw him in October, at which time he was doing well other than pain in his knees and calves.  Given palpable pedal pulses and pre-CABG Doppler study showing triphasic waveforms in both legs (ABI's not reported), further testing was not performed.  Today, Jon Garner reports that he has been feeling very well.  He denies chest pain, shortness of breath, palpitations, lightheadedness, and edema.  The calf tightness that he noted with walking at our prior visit has improved.  He is trying to walk at least a few 100 yards a day.  He has also lost almost 40 pounds since our last visit by changing his  diet.  Some of this was brought on by his brother having poorly controlled diabetes and a recent stroke (they live together) he is tolerating his current medications well.  He notes that he is only taking furosemide on an as-needed basis, as he felt like regular use was dehydrating him.  The patient does not have symptoms concerning for COVID-19 infection (fever, chills, cough, or new shortness of breath).    Past Medical History:  Diagnosis Date  . CAD (coronary artery disease)    a. LHC 4/19: pLAD 95%, mLAD 95%, m/dLAD 90%, ostD2 95%, pLCx 80%, mRCA 70%, dRCA 80%; b. 4-V CABG 05/2017: LIMA-LAD, VG-D1, VG-PDA, VG-OM2  . Essential hypertension 2014  . GERD (gastroesophageal reflux disease) 2015  . Morbid obesity (HCC) presently  . PXE (pseudoxanthoma elasticum) 1992  . Snores presently  . Stroke [facility]) 1992   a. Age 59   Past Surgical History:  Procedure Laterality Date  . CORONARY ARTERY BYPASS GRAFT N/A 05/29/2017   Procedure: CORONARY ARTERY BYPASS GRAFTING (CABG)  X 4 WITH OPEN HARVESTING OF LEFT AND RIGHT SAPHENOUS VEINS;  Surgeon: Loreli Slot, MD;  Location: [facility] OR;  Service: Open Heart Surgery;  Laterality: N/A;  . KNEE SURGERY Bilateral   . LEFT HEART CATH AND CORONARY ANGIOGRAPHY N/A 05/26/2017   Procedure: LEFT HEART CATH AND CORONARY ANGIOGRAPHY;  Surgeon: Antonieta Iba, MD;  Location: [facility] INVASIVE CV LAB;  Service: Cardiovascular;  Laterality: N/A;  . TEE WITHOUT CARDIOVERSION N/A 05/29/2017   Procedure: TRANSESOPHAGEAL ECHOCARDIOGRAM (TEE);  Surgeon: Loreli Slot, MD;  Location: [facility] OR;  Service: Open Heart Surgery;  Laterality: N/A;     Current Meds  Medication Sig  . aspirin EC 81 MG tablet Take 81 mg by mouth once.  Marland Kitchen atorvastatin (LIPITOR) 80 MG tablet TAKE 1 TABLET(80 MG) BY MOUTH DAILY AT 6 PM  . clopidogrel (PLAVIX) 75 MG tablet Take 1 tablet (75 mg total) by mouth daily.  . furosemide (LASIX) 20 MG tablet Take 1 tablet (20 mg total) by mouth  daily. (Patient taking differently: Take 20 mg by mouth daily as needed. )  . lisinopril (PRINIVIL,ZESTRIL) 40 MG tablet Take 1 tablet (40 mg total) by mouth daily. Pt needs to contact office to schedule future appointment and refills, Thank you. [phone]  . metoprolol tartrate (LOPRESSOR) 25 MG tablet Take 1 tablet (25 mg total) by mouth 2 (two) times daily.  Marland Kitchen omeprazole (PRILOSEC) 20 MG capsule TAKE 1 CAPSULE(20 MG) BY MOUTH DAILY  . potassium chloride (K-DUR) 10 MEQ tablet Take 1 tablet (10 mEq total) by mouth daily.  . vitamin C (ASCORBIC ACID) 500 MG tablet Take 500 mg by mouth daily as needed.     Allergies:   Penicillins   Social History   Tobacco Use  . Smoking status: Former Smoker    Packs/day: 1.50    Years: 10.00    Pack years: 15.00    Types: Cigarettes    Quit date: 12/05/2013    Years since quitting: 4.6  . Smokeless tobacco: Never Used  Substance Use Topics  . Alcohol use: Not Currently  . Drug use: Never     Family Hx: The patient's family history includes COPD in his father; Dementia in his mother; Multiple sclerosis in his sister; Other in his father.  ROS:   Please see the history of present illness.   All other systems reviewed and are negative.   Prior CV studies:   The following studies were reviewed today:  LHC (05/26/2017): LMCA normal. LAD with 95% proximal, 95% mid, and 90% distal stenoses, D2 with 95% ostial lesion, LCX with 80% mid vessel stenosis, and RCA with sequential 70% mid and 80% distal stenoses.  LVEF 55-65%.  LVEDP ~20 mmHg.  Echo (05/26/2017): Normal LV size with LVEF 55-65%.  Grade 1 diastolic dysfunction.  Normal RV size and function.  Normal PA pressure.  No significant valvular abnormality.  Labs/Other Tests and Data Reviewed:    EKG:  No ECG reviewed.  Recent Labs: 07/29/2017: ALT 20 11/05/2017: BUN 16; Creatinine, Ser 1.22; Potassium 4.3; Sodium 140   Recent Lipid Panel Lab Results  Component Value Date/Time   CHOL 91  (L) 07/29/2017 08:56 AM   TRIG 91 07/29/2017 08:56 AM   HDL 26 (L) 07/29/2017 08:56 AM   CHOLHDL 3.5 07/29/2017 08:56 AM   CHOLHDL 5.4 05/27/2017 04:46 AM   LDLCALC 47 07/29/2017 08:56 AM    Wt Readings from Last 3 Encounters:  07/22/18 245 lb (111.1 kg)  11/05/17 281 lb 4 oz (127.6 kg)  08/06/17 277 lb 8 oz (125.9 kg)     Objective:    Vital Signs:  BP 110/70 (BP Location: Left Arm, Patient Position: Sitting, Cuff Size: Normal)   Pulse 67   Ht 5\' 8"  (1.727 m)   Wt 245 lb (111.1 kg)   BMI 37.25 kg/m    VITAL SIGNS:  reviewed GEN:  no acute distress  ASSESSMENT & PLAN:    Coronary artery disease: Jon Garner is doing very well without any signs or symptoms to  suggest worsening coronary insufficiency.  It has been more than 1 year since his NSTEMI and CABG.  We will therefore discontinue clopidogrel and continue indefinite aspirin 81 mg daily.  I encouraged Jon Garner to continue to exercise and lose weight.  We will check a CBC and CMP at Jon Garner's convenience.  Postoperative atrial fibrillation: No symptoms to suggest recurrence.  Prior event monitor did not show evidence of recurrent atrial fibrillation, which led to discontinuation of amiodarone and apixaban.  No further work-up at this time.  Hypertension: Blood pressure well controlled.  Continue current medications.  Hyperlipidemia: LDL well controlled on last check a year ago.  Jon Garner is tolerating atorvastatin well.  We will check a CMP and fasting lipid panel at his convenience.  HFpEF: Jon Garner reports improved leg swelling and no significant edema.  He has been using furosemide on an as-needed basis, which I think is reasonable to do.  COVID-19 Education: The signs and symptoms of COVID-19 were discussed with the patient and how to seek care for testing (follow up with PCP or arrange E-visit).  The importance of social distancing was discussed today.  Time:   Today, I have spent 15 minutes with the patient  with telehealth technology discussing the above problems.     Medication Adjustments/Labs and Tests Ordered: Current medicines are reviewed at length with the patient today.  Concerns regarding medicines are outlined above.   Tests Ordered: No orders of the defined types were placed in this encounter.   Medication Changes: No orders of the defined types were placed in this encounter.   Follow Up:  In Person in 6 month(s)  Signed, Yvonne Kendallhristopher Celia Friedland, MD  07/22/2018 9:37 AM    [facility]

## 2018-07-22 ENCOUNTER — Encounter: Payer: Self-pay | Admitting: Internal Medicine

## 2018-07-22 ENCOUNTER — Other Ambulatory Visit: Payer: Self-pay

## 2018-07-22 ENCOUNTER — Telehealth: Payer: Self-pay | Admitting: *Deleted

## 2018-07-22 ENCOUNTER — Telehealth (INDEPENDENT_AMBULATORY_CARE_PROVIDER_SITE_OTHER): Payer: BC Managed Care – PPO | Admitting: Internal Medicine

## 2018-07-22 VITALS — BP 110/70 | HR 67 | Ht 68.0 in | Wt 245.0 lb

## 2018-07-22 DIAGNOSIS — E785 Hyperlipidemia, unspecified: Secondary | ICD-10-CM | POA: Diagnosis not present

## 2018-07-22 DIAGNOSIS — I9789 Other postprocedural complications and disorders of the circulatory system, not elsewhere classified: Secondary | ICD-10-CM | POA: Diagnosis not present

## 2018-07-22 DIAGNOSIS — I10 Essential (primary) hypertension: Secondary | ICD-10-CM

## 2018-07-22 DIAGNOSIS — I4891 Unspecified atrial fibrillation: Secondary | ICD-10-CM

## 2018-07-22 DIAGNOSIS — I2510 Atherosclerotic heart disease of native coronary artery without angina pectoris: Secondary | ICD-10-CM

## 2018-07-22 DIAGNOSIS — I5032 Chronic diastolic (congestive) heart failure: Secondary | ICD-10-CM

## 2018-07-22 MED ORDER — POTASSIUM CHLORIDE ER 10 MEQ PO TBCR: 10.0000 meq | EXTENDED_RELEASE_TABLET | Freq: Every day | ORAL | 3 refills | Status: DC

## 2018-07-22 MED ORDER — FUROSEMIDE 20 MG PO TABS: 20.0000 mg | ORAL_TABLET | Freq: Every day | ORAL | Status: DC | PRN

## 2018-07-22 MED ORDER — FUROSEMIDE 20 MG PO TABS: 20.0000 mg | ORAL_TABLET | Freq: Every day | ORAL | 2 refills | Status: DC | PRN

## 2018-07-22 MED ORDER — NITROGLYCERIN 0.4 MG SL SUBL: 0.4000 mg | SUBLINGUAL_TABLET | SUBLINGUAL | 3 refills | Status: DC | PRN

## 2018-07-22 MED ORDER — LISINOPRIL 40 MG PO TABS: 40.0000 mg | ORAL_TABLET | Freq: Every day | ORAL | 2 refills | Status: DC

## 2018-07-22 MED ORDER — CLOPIDOGREL BISULFATE 75 MG PO TABS: 75.0000 mg | ORAL_TABLET | Freq: Every day | ORAL | 2 refills | Status: DC

## 2018-07-22 MED ORDER — METOPROLOL TARTRATE 25 MG PO TABS: 25.0000 mg | ORAL_TABLET | Freq: Two times a day (BID) | ORAL | 2 refills | Status: DC

## 2018-07-22 MED ORDER — ATORVASTATIN CALCIUM 80 MG PO TABS: ORAL_TABLET | ORAL | 2 refills | Status: DC

## 2018-07-22 NOTE — Patient Instructions (Addendum)
Medication Instructions:  Your physician has recommended you make the following change in your medication:  1- STOP Clopidogrel. 2- Continue Aspirin 81 mg by mouth once a day.  3- Nitroglycerin 0.4 mg tablet AS NEEDED - Dissolve 1 tablet under your tongue every 5 minutes as needed for chest pain. Maximum of 3 doses. If chest pain does not subside, call 911 or go to the Emergency Room.  If you need a refill on your cardiac medications before your next appointment, please call your pharmacy.   Lab work: Your physician recommends that you return for lab work in: at your earliest convenience.  - We will be checking CBC, CMP, and fasting lipid panel.  - You will need to be fasting. - Please go to the [facility]. You will check in at the front desk to the right as you walk into the atrium. Valet Parking is offered if needed. - No appointment needed. You may go anytime between 07:00 am and 6 pm.    If you have labs (blood work) drawn today and your tests are completely normal, you will receive your results only by: Marland Kitchen MyChart Message (if you have MyChart) OR . A paper copy in the mail If you have any lab test that is abnormal or we need to change your treatment, we will call you to review the results.  Testing/Procedures: - None ordered.   Follow-Up: At [facility], you and your health needs are our priority.  As part of our continuing mission to provide you with exceptional heart care, we have created designated Provider Care Teams.  These Care Teams include your primary Cardiologist (physician) and Advanced Practice Providers (APPs -  Physician Assistants and Nurse Practitioners) who all work together to provide you with the care you need, when you need it. You will need a follow up appointment in 6 months.  Please call our office 2 months in advance to schedule this appointment.  You may see Ida Rogue, MD or one of the following Advanced Practice Providers on your designated  Care Team:   Murray Hodgkins, NP Christell Faith, PA-C . Marrianne Mood, PA-C

## 2018-07-22 NOTE — Telephone Encounter (Signed)
Called patient and reviewed AVS. He is aware it is on his MyChart to view. He is aware of the medication instructions, lab work and follow up as printed on the AVS.

## 2018-08-22 ENCOUNTER — Other Ambulatory Visit: Payer: Self-pay | Admitting: Family Medicine

## 2018-12-23 ENCOUNTER — Other Ambulatory Visit: Payer: Self-pay | Admitting: Internal Medicine

## 2018-12-23 ENCOUNTER — Other Ambulatory Visit: Payer: Self-pay

## 2018-12-23 MED ORDER — LISINOPRIL 40 MG PO TABS: 40.0000 mg | ORAL_TABLET | Freq: Every day | ORAL | 2 refills | Status: DC

## 2019-02-07 ENCOUNTER — Other Ambulatory Visit: Payer: Self-pay | Admitting: Internal Medicine

## 2019-02-08 ENCOUNTER — Other Ambulatory Visit: Payer: Self-pay | Admitting: *Deleted

## 2019-02-08 MED ORDER — ATORVASTATIN CALCIUM 80 MG PO TABS: 80.0000 mg | ORAL_TABLET | Freq: Every day | ORAL | 0 refills | Status: DC

## 2019-02-08 MED ORDER — ATORVASTATIN CALCIUM 80 MG PO TABS: ORAL_TABLET | ORAL | 0 refills | Status: DC

## 2019-02-22 NOTE — Progress Notes (Signed)
Follow-up Outpatient Visit Date: 02/24/2019  Primary Care Provider: Maple Hudson., MD [address] [place] [address]  Chief Complaint: Follow-up coronary artery disease  HPI:  Jon Garner is a 54 y.o. 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  We last spoke via virtual visit in June, at which time Jon Garner was feeling very well.  He had last 40 pounds over the preceding 8 months through diet changes and exercise.  Amiodarone and apixaban were previously stopped, as there was no evidence of recurrent a-fib by symptoms or event monitoring.  No medication changes were made.  Today, Jon Garner reports that he is feeling fairly well from a heart standpoint.  He is discouraged that he has regained a lot of the weight that he had previously lost, though he is trying to change his diet and exercise more.  He has not had any chest pain, shortness of breath, palpitations, or lightheadedness.  He has noticed some swelling in his legs as well as a wound along the left medial calf that developed 2 months ago.  He believes he may have had some minor trauma in this area but that the wound has yet to heal.  It continues to have a small amount of yellowish drainage.  At times, he feels a stinging or pulling sensation in the area.  Jon Garner notes that he was previously referred for a sleep study by Dr. Nicholos Johns but was unable to do this due to insurance issues.  He now has insurance and wishes to proceed with this.  Overall, his fatigue and snoring are unchanged.  --------------------------------------------------------------------------------------------------  Past Medical History:  Diagnosis Date  . CAD  (coronary artery disease)    a. LHC 4/19: pLAD 95%, mLAD 95%, m/dLAD 90%, ostD2 95%, pLCx 80%, mRCA 70%, dRCA 80%; b. 4-V CABG 05/2017: LIMA-LAD, VG-D1, VG-PDA, VG-OM2  . Essential hypertension 2014  . GERD (gastroesophageal reflux disease) 2015  . Morbid obesity (HCC) presently  . PXE (pseudoxanthoma elasticum) 1992  . Snores presently  . Stroke [facility]) 1992   a. Age 89   Past Surgical History:  Procedure Laterality Date  . CORONARY ARTERY BYPASS GRAFT N/A 05/29/2017   Procedure: CORONARY ARTERY BYPASS GRAFTING (CABG)  X 4 WITH OPEN HARVESTING OF LEFT AND RIGHT SAPHENOUS VEINS;  Surgeon: Loreli Slot, MD;  Location: [facility] OR;  Service: Open Heart Surgery;  Laterality: N/A;  . KNEE SURGERY Bilateral   . LEFT HEART CATH AND CORONARY ANGIOGRAPHY N/A 05/26/2017   Procedure: LEFT HEART CATH AND CORONARY ANGIOGRAPHY;  Surgeon: Antonieta Iba, MD;  Location: [facility] INVASIVE CV LAB;  Service: Cardiovascular;  Laterality: N/A;  . TEE WITHOUT CARDIOVERSION N/A 05/29/2017   Procedure: TRANSESOPHAGEAL ECHOCARDIOGRAM (TEE);  Surgeon: Loreli Slot, MD;  [facility];  Service: Open Heart Surgery;  Laterality: N/A;    Current Meds  Medication Sig  . aspirin EC 81 MG tablet Take 81 mg by mouth once.  Marland Kitchen atorvastatin (LIPITOR) 80 MG tablet Take 1 tablet (80 mg total) by mouth daily.  Marland Kitchen lisinopril (ZESTRIL) 40 MG tablet Take 1 tablet (40 mg total) by mouth daily.  . metoprolol tartrate (LOPRESSOR) 25 MG tablet TAKE 1 TABLET(25 MG) BY MOUTH TWICE DAILY  . nitroGLYCERIN (NITROSTAT) 0.4  MG SL tablet Place 1 tablet (0.4 mg total) under the tongue every 5 (five) minutes as needed for chest pain. Maximum of 3 doses.  Marland Kitchen omeprazole (PRILOSEC) 20 MG capsule TAKE 1 CAPSULE DAILY (DUE FOR OFFICE VISIT BEFORE NEXT REFILL)  . potassium chloride (K-DUR) 10 MEQ tablet Take 1 tablet (10 mEq total) by mouth daily.  . vitamin C (ASCORBIC ACID) 500 MG tablet Take 500 mg by mouth daily as needed.  .  [DISCONTINUED] furosemide (LASIX) 20 MG tablet Take 1 tablet (20 mg total) by mouth daily as needed.    Allergies: Penicillins  Social History   Tobacco Use  . Smoking status: Former Smoker    Packs/day: 1.50    Years: 10.00    Pack years: 15.00    Types: Cigarettes    Quit date: 12/05/2013    Years since quitting: 5.2  . Smokeless tobacco: Never Used  Substance Use Topics  . Alcohol use: Not Currently  . Drug use: Never    Family History  Problem Relation Age of Onset  . Dementia Mother   . Other Father        died in his 1's  . COPD Father   . Multiple sclerosis Sister     Review of Systems: A 12-system review of systems was performed and was negative except as noted in the HPI.  --------------------------------------------------------------------------------------------------  Physical Exam: BP 110/70 (BP Location: Left Arm, Patient Position: Sitting, Cuff Size: Large)   Pulse 66   Ht 5\' 7"  (1.702 m)   Wt 288 lb 8 oz (130.9 kg)   SpO2 98%   BMI 45.19 kg/m   General: NAD. HEENT: No conjunctival pallor or scleral icterus. Facemask in place. Neck: Supple without lymphadenopathy, thyromegaly, JVD, or HJR. Lungs: Normal work of breathing. Clear to auscultation bilaterally without wheezes or crackles. Heart: Regular rate and rhythm without murmurs, rubs, or gallops. Non-displaced PMI. Abd: Bowel sounds present. Soft, NT/ND without hepatosplenomegaly Ext: 2+ edema in both calves.  Radial pulses are 2+.  Pedal pulses are diminished bilaterally. Skin: Warm and dry.  Both calves demonstrate changes consistent with venous stasis.  There is a superficial ulceration along the left medial calf with a small amount of yellowish discharge.  EKG: Normal sinus rhythm with inferior Q waves and a low voltage.  No significant change since 11/05/2017.  Lab Results  Component Value Date   WBC 8.4 02/24/2019   HGB 14.5 02/24/2019   HCT 43.0 02/24/2019   MCV 88 02/24/2019   PLT  271 02/24/2019    Lab Results  Component Value Date   NA 140 02/24/2019   K 4.4 02/24/2019   CL 105 02/24/2019   CO2 20 02/24/2019   BUN 18 02/24/2019   CREATININE 0.98 02/24/2019   GLUCOSE 109 (H) 02/24/2019   ALT 17 02/24/2019    Lab Results  Component Value Date   CHOL 103 02/24/2019   HDL 33 (L) 02/24/2019   LDLCALC 54 02/24/2019   TRIG 81 02/24/2019   CHOLHDL 3.1 02/24/2019    --------------------------------------------------------------------------------------------------  ASSESSMENT AND PLAN: Coronary artery disease: No symptoms to suggest worsening coronary insufficiency.  EKG today is stable with evidence of prior inferior MI.  Continue current medications for secondary prevention, including aspirin, atorvastatin, and metoprolol.  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  There is a shallow ulcer along the left medial calf that has been present for at  least 2 months and is not healing well per Jon Garner.  I suspect venous insufficiency and underlying edema, possibly from a component of heart failure, or contributing to this.  I have recommended adding furosemide 40 mg daily.  We will obtain an echocardiogram to assess LVEF.  I will check a CBC and CMP today as well.  I have encouraged Jon Garner to elevate his legs when possible.  We will also obtain ABIs with lower extremity Doppler study when he comes in for his echo to exclude arterial insufficiency.  I will refer Jon Garner to the wound care clinic to assist with management of his nonhealing left calf ulcer.  Hyperlipidemia: I will check a lipid panel and CMP today.  We will plan to continue high intensity statin therapy.  Hypertension: Blood pressure is well controlled.  Continue metoprolol and lisinopril.  Fatigue and snoring: Jon Garner was previously seen by Dr. Ashby Dawes and was advised to undergo polysomnography.   However, he was unable to do this due to lack of insurance.  He is now insured and wishes to proceed with sleep study.  We will refer him for this.  Follow-up: Return to clinic in 2 weeks.  Nelva Bush, MD 02/26/2019 7:08 AM

## 2019-02-24 ENCOUNTER — Ambulatory Visit: Payer: BC Managed Care – PPO | Admitting: Internal Medicine

## 2019-02-24 ENCOUNTER — Other Ambulatory Visit: Payer: Self-pay

## 2019-02-24 ENCOUNTER — Encounter: Payer: Self-pay | Admitting: Internal Medicine

## 2019-02-24 ENCOUNTER — Other Ambulatory Visit: Payer: Self-pay | Admitting: *Deleted

## 2019-02-24 VITALS — BP 110/70 | HR 66 | Ht 67.0 in | Wt 288.5 lb

## 2019-02-24 DIAGNOSIS — I2510 Atherosclerotic heart disease of native coronary artery without angina pectoris: Secondary | ICD-10-CM | POA: Diagnosis not present

## 2019-02-24 DIAGNOSIS — E785 Hyperlipidemia, unspecified: Secondary | ICD-10-CM

## 2019-02-24 DIAGNOSIS — R600 Localized edema: Secondary | ICD-10-CM

## 2019-02-24 DIAGNOSIS — S81802A Unspecified open wound, left lower leg, initial encounter: Secondary | ICD-10-CM

## 2019-02-24 DIAGNOSIS — I10 Essential (primary) hypertension: Secondary | ICD-10-CM | POA: Diagnosis not present

## 2019-02-24 DIAGNOSIS — R0683 Snoring: Secondary | ICD-10-CM

## 2019-02-24 DIAGNOSIS — R5383 Other fatigue: Secondary | ICD-10-CM

## 2019-02-24 MED ORDER — FUROSEMIDE 40 MG PO TABS: 40.0000 mg | ORAL_TABLET | Freq: Every day | ORAL | 3 refills | Status: DC

## 2019-02-24 NOTE — Patient Instructions (Addendum)
Medication Instructions:  Your physician has recommended you make the following change in your medication:  1- START Lasix 40 mg by mouth once a day.  *If you need a refill on your cardiac medications before your next appointment, please call your pharmacy*  Lab Work: Your physician recommends that you return for lab work in: TODAY - CBC no diff, CMP, LIPID.  If you have labs (blood work) drawn today and your tests are completely normal, you will receive your results only by: Marland Kitchen MyChart Message (if you have MyChart) OR . A paper copy in the mail If you have any lab test that is abnormal or we need to change your treatment, we will call you to review the results.  Testing/Procedures: 1- Your physician has requested that you have an echocardiogram. Echocardiography is a painless test that uses sound waves to create images of your heart. It provides your doctor with information about the size and shape of your heart and how well your heart's chambers and valves are working. This procedure takes approximately one hour. There are no restrictions for this procedure. You may get an IV, if needed, to receive an ultrasound enhancing agent through to better visualize your heart.   2- Your physician has requested that you have an ankle brachial index (ABI). During this test an ultrasound and blood pressure cuff are used to evaluate the arteries that supply the arms and legs with blood. Allow thirty minutes for this exam. There are no restrictions or special instructions.  3- Your physician has requested that you have a lower extremity arterial doppler- During this test, ultrasound is used to evaluate arterial blood flow in the legs. Allow approximately one hour for this exam.   Follow-Up: You have been referred to [facility]. Please call them if you do not receive a call.   Your physician recommends that you schedule a follow-up appointment with Pulmonology for Sleep study. Please call them if you do  not receive a call.     At [facility], you and your health needs are our priority.  As part of our continuing mission to provide you with exceptional heart care, we have created designated Provider Care Teams.  These Care Teams include your primary Cardiologist (physician) and Advanced Practice Providers (APPs -  Physician Assistants and Nurse Practitioners) who all work together to provide you with the care you need, when you need it.  Your next appointment:   2 week(s)  The format for your next appointment:   In Person  Provider:    You may see Jon Nordmann, MD or one of the following Advanced Practice Providers on your designated Care Team:    Jon Ducking, NP  Jon Listen, PA-C  Jon Ivan, PA-C

## 2019-02-25 LAB — COMPREHENSIVE METABOLIC PANEL
ALT: 17 IU/L (ref 0–44)
AST: 18 IU/L (ref 0–40)
Albumin/Globulin Ratio: 1.7 (ref 1.2–2.2)
Albumin: 4.2 g/dL (ref 3.8–4.9)
Alkaline Phosphatase: 91 IU/L (ref 39–117)
BUN/Creatinine Ratio: 18 (ref 9–20)
BUN: 18 mg/dL (ref 6–24)
Bilirubin Total: 0.5 mg/dL (ref 0.0–1.2)
CO2: 20 mmol/L (ref 20–29)
Calcium: 8.8 mg/dL (ref 8.7–10.2)
Chloride: 105 mmol/L (ref 96–106)
Creatinine, Ser: 0.98 mg/dL (ref 0.76–1.27)
GFR calc Af Amer: 101 mL/min/{1.73_m2} (ref >59)
GFR calc non Af Amer: 87 mL/min/{1.73_m2} (ref >59)
Globulin, Total: 2.5 g/dL (ref 1.5–4.5)
Glucose: 109 mg/dL — ABNORMAL HIGH (ref 65–99)
Potassium: 4.4 mmol/L (ref 3.5–5.2)
Sodium: 140 mmol/L (ref 134–144)
Total Protein: 6.7 g/dL (ref 6.0–8.5)

## 2019-02-25 LAB — LIPID PANEL
Chol/HDL Ratio: 3.1 ratio (ref 0.0–5.0)
Cholesterol, Total: 103 mg/dL (ref 100–199)
HDL: 33 mg/dL — ABNORMAL LOW (ref >39)
LDL Chol Calc (NIH): 54 mg/dL (ref 0–99)
Triglycerides: 81 mg/dL (ref 0–149)
VLDL Cholesterol Cal: 16 mg/dL (ref 5–40)

## 2019-02-25 LAB — CBC
Hematocrit: 43 % (ref 37.5–51.0)
Hemoglobin: 14.5 g/dL (ref 13.0–17.7)
MCH: 29.8 pg (ref 26.6–33.0)
MCHC: 33.7 g/dL (ref 31.5–35.7)
MCV: 88 fL (ref 79–97)
Platelets: 271 10*3/uL (ref 150–450)
RBC: 4.87 x10E6/uL (ref 4.14–5.80)
RDW: 13.1 % (ref 11.6–15.4)
WBC: 8.4 10*3/uL (ref 3.4–10.8)

## 2019-02-26 ENCOUNTER — Encounter: Payer: Self-pay | Admitting: Internal Medicine

## 2019-02-26 DIAGNOSIS — S81802A Unspecified open wound, left lower leg, initial encounter: Secondary | ICD-10-CM | POA: Insufficient documentation

## 2019-02-26 DIAGNOSIS — R600 Localized edema: Secondary | ICD-10-CM | POA: Insufficient documentation

## 2019-03-01 ENCOUNTER — Encounter: Payer: Managed Care, Other (non HMO) | Attending: Physician Assistant | Admitting: Physician Assistant

## 2019-03-01 ENCOUNTER — Ambulatory Visit (INDEPENDENT_AMBULATORY_CARE_PROVIDER_SITE_OTHER): Payer: Managed Care, Other (non HMO)

## 2019-03-01 ENCOUNTER — Other Ambulatory Visit: Payer: Self-pay

## 2019-03-01 DIAGNOSIS — I872 Venous insufficiency (chronic) (peripheral): Secondary | ICD-10-CM | POA: Diagnosis not present

## 2019-03-01 DIAGNOSIS — I2510 Atherosclerotic heart disease of native coronary artery without angina pectoris: Secondary | ICD-10-CM | POA: Insufficient documentation

## 2019-03-01 DIAGNOSIS — Z880 Allergy status to penicillin: Secondary | ICD-10-CM | POA: Diagnosis not present

## 2019-03-01 DIAGNOSIS — L97822 Non-pressure chronic ulcer of other part of left lower leg with fat layer exposed: Secondary | ICD-10-CM | POA: Diagnosis present

## 2019-03-01 DIAGNOSIS — I10 Essential (primary) hypertension: Secondary | ICD-10-CM | POA: Diagnosis not present

## 2019-03-01 DIAGNOSIS — S81802A Unspecified open wound, left lower leg, initial encounter: Secondary | ICD-10-CM | POA: Diagnosis not present

## 2019-03-01 DIAGNOSIS — R600 Localized edema: Secondary | ICD-10-CM

## 2019-03-01 DIAGNOSIS — I252 Old myocardial infarction: Secondary | ICD-10-CM | POA: Diagnosis not present

## 2019-03-01 NOTE — Progress Notes (Signed)
DAVIDJAMES, BLANSETT ([id number]) Visit Report for 03/01/2019 Chief Complaint Document Details Patient Name: Jon Garner, Jon Garner. Date of Service: 03/01/2019 9:45 AM Medical Record Number: [id number] Patient Account Number: [id number] Date of Birth/Sex: [birth year] (54 y.o. M) Treating RN: Huel Coventry Primary Care Provider: Wendelyn Breslow, Gerlene Burdock Other Clinician: Referring Provider: END, CHRISTOPHER Treating Provider/Extender: Linwood Dibbles, Dexter Signor Weeks in Treatment: 0 Information Obtained from: Patient Chief Complaint Left LE Ulcer Electronic Signature(s) Signed: 03/01/2019 10:01:03 AM By: Lenda Kelp PA-C Entered By: Lenda Kelp on 03/01/2019 10:01:02 Jon Garner ([id number]) -------------------------------------------------------------------------------- Debridement Details Patient Name: Jon Garner Date of Service: 03/01/2019 9:45 AM Medical Record Number: [id number] Patient Account Number: [id number] Date of Birth/Sex: 11-03-1965 (54 y.o. M) Treating RN: Rodell Perna Primary Care Provider: Megan Mans Other Clinician: Referring Provider: END, CHRISTOPHER Treating Provider/Extender: STONE III, Susette Seminara Weeks in Treatment: 0 Debridement Performed for Wound #1 Left Lower Leg Assessment: Performed By: Physician STONE III, Ji Feldner E., PA-C Debridement Type: Chemical/Enzymatic/Mechanical Agent Used: saline and gauze Severity of Tissue Pre Fat layer exposed Debridement: Level of Consciousness (Pre- Awake and Alert procedure): Pre-procedure Verification/Time Yes - 10:05 Out Taken: Start Time: 10:06 Pain Control: Lidocaine Instrument: Other : saline and gauze Bleeding: Moderate End Time: 10:07 Response to Treatment: Procedure was tolerated well Level of Consciousness Awake and Alert (Post-procedure): Post Debridement Measurements of Total Wound Length: (cm) 1.5 Width: (cm) 1.5 Depth: (cm) 0.1 Volume: (cm) 0.177 Character of Wound/Ulcer Post Debridement:  Stable Severity of Tissue Post Debridement: Fat layer exposed Post Procedure Diagnosis Same as Pre-procedure Electronic Signature(s) Signed: 03/01/2019 12:59:59 PM By: Rodell Perna Signed: 03/01/2019 4:16:35 PM By: Lenda Kelp PA-C Entered By: Rodell Perna on 03/01/2019 10:09:56 Jon Garner ([id number]) -------------------------------------------------------------------------------- HPI Details Patient Name: Jon Garner Date of Service: 03/01/2019 9:45 AM Medical Record Number: [id number] Patient Account Number: [id number] Date of Birth/Sex: May 15, 1965 (54 y.o. M) Treating RN: Huel Coventry Primary Care Provider: Megan Mans Other Clinician: Referring Provider: END, CHRISTOPHER Treating Provider/Extender: Linwood Dibbles, Roc Streett Weeks in Treatment: 0 History of Present Illness HPI Description: 03/01/2019 upon evaluation today patient presents for initial inspection here in our clinic concerning issues he has been having for the past 2 months with a wound over the left lower leg anteriorly. He states that this just came up he really did not have any injury he is not sure what exactly caused that other than his legs are always "very tight". He has been referred by Dr. Cristal Deer End for arterial studies and actually has those later today. He also has a history of chronic venous insufficiency, hypertension, and coronary artery disease. Subsequently he is also gotten compression stockings in the past 2 weeks although obviously somewhat hard to put that on on the left side being that again he has significant issues with swelling on top of the fact has been having put a dressing over this area as well that makes even that much more difficult. Nonetheless he does need good compression wrap possibly even a 4-layer compression wrap to get some of the edema down but we need to make sure his arterial flow is good prior to that. Fortunately there is no evidence of active infection.  No fevers, chills, nausea, vomiting, or diarrhea. Electronic Signature(s) Signed: 03/01/2019 3:14:51 PM By: Lenda Kelp PA-C Entered By: Lenda Kelp on 03/01/2019 15:14:51 Jon Garner ([id number]) -------------------------------------------------------------------------------- Physical Exam Details Patient Name: Jon Garner Date of Service: 03/01/2019 9:45 AM Medical Record Number: [id number] Patient Account  Number: [id number] Date of Birth/Sex: 1965/08/22 (54 y.o. M) Treating RN: Huel Coventry Primary Care Provider: Wendelyn Breslow, Gerlene Burdock Other Clinician: Referring Provider: END, CHRISTOPHER Treating Provider/Extender: Linwood Dibbles, Caytlyn Evers Weeks in Treatment: 0 Constitutional patient is hypertensive.. pulse regular and within target range for patient.Marland Kitchen respirations regular, non-labored and within target range for patient.Marland Kitchen temperature within target range for patient.. Well-nourished and well-hydrated in no acute distress. Eyes conjunctiva clear no eyelid edema noted. pupils equal round and reactive to light and accommodation. Ears, Nose, Mouth, and Throat no gross abnormality of ear auricles or external auditory canals. normal hearing noted during conversation. mucus membranes moist. Respiratory normal breathing without difficulty. Cardiovascular 2+ dorsalis pedis/posterior tibialis pulses. 1+ pitting edema of the bilateral lower extremities. Gastrointestinal (GI) soft, non-tender, non-distended, +BS. Musculoskeletal normal gait and posture. no significant deformity or arthritic changes, no loss or range of motion, no clubbing. Psychiatric this patient is able to make decisions and demonstrates good insight into disease process. Alert and Oriented x 3. pleasant and cooperative. Notes Upon inspection patient's wound bed actually showed signs of good granulation at this time. Fortunately there is no evidence of significant or active infection. I do believe that the main  issue that I see right now is simply that of edema and the fact that this needs to be much better controlled in order to see things improve. With that being said I think we can need to likely initiate compression therapy to get things under control. With that being said I do believe that the patient would likely tolerate a compression wrap but we need to make sure that there is no signs of arterial obstruction prior to initiation. Electronic Signature(s) Signed: 03/01/2019 3:15:52 PM By: Lenda Kelp PA-C Entered By: Lenda Kelp on 03/01/2019 15:15:52 Jon Garner ([id number]) -------------------------------------------------------------------------------- Physician Orders Details Patient Name: Jon Garner Date of Service: 03/01/2019 9:45 AM Medical Record Number: [id number] Patient Account Number: [id number] Date of Birth/Sex: January 07, 1966 (54 y.o. M) Treating RN: Rodell Perna Primary Care Provider: Megan Mans Other Clinician: Referring Provider: END, CHRISTOPHER Treating Provider/Extender: Linwood Dibbles, Zaydyn Havey Weeks in Treatment: 0 Verbal / Phone Orders: No Diagnosis Coding ICD-10 Coding Code Description I87.2 Venous insufficiency (chronic) (peripheral) L97.822 Non-pressure chronic ulcer of other part of left lower leg with fat layer exposed I10 Essential (primary) hypertension I25.10 Atherosclerotic heart disease of native coronary artery without angina pectoris Wound Cleansing Wound #1 Left Lower Leg o Clean wound with Normal Saline. - in office o Cleanse wound with mild soap and water Primary Wound Dressing Wound #1 Left Lower Leg o Silver Collagen Secondary Dressing Wound #1 Left Lower Leg o ABD pad o ABD and Kerlix/Conform - conform Dressing Change Frequency Wound #1 Left Lower Leg o Change dressing every other day. Follow-up Appointments Wound #1 Left Lower Leg o Return Appointment in 1 week. Edema Control Wound #1 Left Lower Leg o  Other: - tubi grip F Electronic Signature(s) Signed: 03/01/2019 12:59:59 PM By: Rodell Perna Signed: 03/01/2019 4:16:35 PM By: Lenda Kelp PA-C Entered By: Rodell Perna on 03/01/2019 10:07:14 Jon Garner ([id number]) -------------------------------------------------------------------------------- Problem List Details Patient Name: Jon Garner Date of Service: 03/01/2019 9:45 AM Medical Record Number: [id number] Patient Account Number: [id number] Date of Birth/Sex: 1965/04/17 (54 y.o. M) Treating RN: Huel Coventry Primary Care Provider: Wendelyn Breslow, Gerlene Burdock Other Clinician: Referring Provider: END, CHRISTOPHER Treating Provider/Extender: Linwood Dibbles, Andy Allende Weeks in Treatment: 0 Active Problems ICD-10 Evaluated Encounter Code Description Active Date Today Diagnosis I87.2 Venous insufficiency (  chronic) (peripheral) 03/01/2019 No Yes L97.822 Non-pressure chronic ulcer of other part of left lower leg with 03/01/2019 No Yes fat layer exposed I10 Essential (primary) hypertension 03/01/2019 No Yes I25.10 Atherosclerotic heart disease of native coronary artery 03/01/2019 No Yes without angina pectoris Inactive Problems Resolved Problems Electronic Signature(s) Signed: 03/01/2019 10:00:24 AM By: Lenda Kelp PA-C Entered By: Lenda Kelp on 03/01/2019 10:00:22 Jon Garner ([id number]) -------------------------------------------------------------------------------- Progress Note Details Patient Name: Jon Garner. Date of Service: 03/01/2019 9:45 AM Medical Record Number: [id number] Patient Account Number: [id number] Date of Birth/Sex: 05-02-1965 (54 y.o. M) Treating RN: Huel Coventry Primary Care Provider: Megan Mans Other Clinician: Referring Provider: END, CHRISTOPHER Treating Provider/Extender: Linwood Dibbles, Laiyah Exline Weeks in Treatment: 0 Subjective Chief Complaint Information obtained from Patient Left LE Ulcer History of Present Illness (HPI) 03/01/2019 upon  evaluation today patient presents for initial inspection here in our clinic concerning issues he has been having for the past 2 months with a wound over the left lower leg anteriorly. He states that this just came up he really did not have any injury he is not sure what exactly caused that other than his legs are always "very tight". He has been referred by Dr. Cristal Deer End for arterial studies and actually has those later today. He also has a history of chronic venous insufficiency, hypertension, and coronary artery disease. Subsequently he is also gotten compression stockings in the past 2 weeks although obviously somewhat hard to put that on on the left side being that again he has significant issues with swelling on top of the fact has been having put a dressing over this area as well that makes even that much more difficult. Nonetheless he does need good compression wrap possibly even a 4-layer compression wrap to get some of the edema down but we need to make sure his arterial flow is good prior to that. Fortunately there is no evidence of active infection. No fevers, chills, nausea, vomiting, or diarrhea. Patient History Information obtained from Patient. Allergies penicillin Family History Cancer - Siblings, Diabetes - Siblings, Heart Disease - Father, Hypertension - Siblings,Mother,Father, Kidney Disease - Siblings, Stroke - Maternal Grandparents, Thyroid Problems - Siblings, No family history of Hereditary Spherocytosis, Lung Disease, Seizures, Tuberculosis. Social History Former smoker - 6 years ago, Marital Status - Single, Alcohol Use - Rarely, Drug Use - No History, Caffeine Use - Daily. Medical History Eyes Denies history of Cataracts, Glaucoma, Optic Neuritis Ear/Nose/Mouth/Throat Denies history of Chronic sinus problems/congestion, Middle ear problems Hematologic/Lymphatic Denies history of Anemia, Hemophilia, Human Immunodeficiency Virus, Lymphedema, Sickle Cell  Disease Respiratory Denies history of Aspiration, Asthma, Chronic Obstructive Pulmonary Disease (COPD), Pneumothorax, Sleep Apnea, Tuberculosis Cardiovascular Patient has history of Coronary Artery Disease, Hypertension, Myocardial Infarction - 2019 Denies history of Angina, Arrhythmia, Congestive Heart Failure, Deep Vein Thrombosis, Hypotension, Peripheral Arterial Disease, Peripheral Venous Disease, Phlebitis, Vasculitis BRAILEN, MACNEAL ([id number]) Gastrointestinal Denies history of Cirrhosis , Colitis, Crohn s, Hepatitis A, Hepatitis B, Hepatitis C Endocrine Denies history of Type I Diabetes, Type II Diabetes Genitourinary Denies history of End Stage Renal Disease Immunological Denies history of Lupus Erythematosus, Raynaud s, Scleroderma Integumentary (Skin) Denies history of History of Burn, History of pressure wounds Musculoskeletal Denies history of Gout, Rheumatoid Arthritis, Osteoarthritis, Osteomyelitis Neurologic Denies history of Dementia, Neuropathy, Quadriplegia, Paraplegia, Seizure Disorder Oncologic Denies history of Received Chemotherapy, Received Radiation Psychiatric Denies history of Anorexia/bulimia Review of Systems (ROS) Constitutional Symptoms (General Health) Denies complaints or symptoms of Fatigue, Fever, Chills, Marked  Weight Change. Eyes Complains or has symptoms of Glasses / Contacts - glasses. Denies complaints or symptoms of Dry Eyes, Vision Changes, macular degeneration Ear/Nose/Mouth/Throat Denies complaints or symptoms of Difficult clearing ears, Sinusitis. Hematologic/Lymphatic Denies complaints or symptoms of Bleeding / Clotting Disorders, Human Immunodeficiency Virus. Respiratory Denies complaints or symptoms of Chronic or frequent coughs, Shortness of Breath. Cardiovascular Denies complaints or symptoms of Chest pain, LE edema. Gastrointestinal Denies complaints or symptoms of Frequent diarrhea, Nausea, Vomiting. Endocrine Denies  complaints or symptoms of Hepatitis, Thyroid disease, Polydypsia (Excessive Thirst). Genitourinary Denies complaints or symptoms of Kidney failure/ Dialysis, Incontinence/dribbling. Immunological Denies complaints or symptoms of Hives, Itching. Integumentary (Skin) Complains or has symptoms of Wounds. Denies complaints or symptoms of Bleeding or bruising tendency, Breakdown, Swelling. Musculoskeletal Denies complaints or symptoms of Muscle Pain, Muscle Weakness. Neurologic Denies complaints or symptoms of Numbness/parasthesias, Focal/Weakness. Psychiatric Denies complaints or symptoms of Anxiety, Claustrophobia. BURDELL, PEED ([id number]) Objective Constitutional patient is hypertensive.. pulse regular and within target range for patient.Marland Kitchen respirations regular, non-labored and within target range for patient.Marland Kitchen temperature within target range for patient.. Well-nourished and well-hydrated in no acute distress. Vitals Time Taken: 9:45 AM, Height: 67 in, Source: Stated, Weight: 280 lbs, Source: Stated, BMI: 43.8, Temperature: 99.2 F, Pulse: 78 bpm, Respiratory Rate: 16 breaths/min, Blood Pressure: 157/57 mmHg. Eyes conjunctiva clear no eyelid edema noted. pupils equal round and reactive to light and accommodation. Ears, Nose, Mouth, and Throat no gross abnormality of ear auricles or external auditory canals. normal hearing noted during conversation. mucus membranes moist. Respiratory normal breathing without difficulty. Cardiovascular 2+ dorsalis pedis/posterior tibialis pulses. 1+ pitting edema of the bilateral lower extremities. Gastrointestinal (GI) soft, non-tender, non-distended, +BS. Musculoskeletal normal gait and posture. no significant deformity or arthritic changes, no loss or range of motion, no clubbing. Psychiatric this patient is able to make decisions and demonstrates good insight into disease process. Alert and Oriented x 3. pleasant and cooperative. General  Notes: Upon inspection patient's wound bed actually showed signs of good granulation at this time. Fortunately there is no evidence of significant or active infection. I do believe that the main issue that I see right now is simply that of edema and the fact that this needs to be much better controlled in order to see things improve. With that being said I think we can need to likely initiate compression therapy to get things under control. With that being said I do believe that the patient would likely tolerate a compression wrap but we need to make sure that there is no signs of arterial obstruction prior to initiation. Integumentary (Hair, Skin) Wound #1 status is Open. Original cause of wound was Blister. The wound is located on the Left Lower Leg. The wound measures 1.5cm length x 1.5cm width x 0.1cm depth; 1.767cm^2 area and 0.177cm^3 volume. There is Fat Layer (Subcutaneous Tissue) Exposed exposed. There is no tunneling or undermining noted. There is a medium amount of serous drainage noted. There is small (1-33%) pink granulation within the wound bed. There is a large (67-100%) amount of necrotic tissue within the wound bed including Adherent Slough. Assessment Active Problems ICD-10 Venous insufficiency (chronic) (peripheral) Non-pressure chronic ulcer of other part of left lower leg with fat layer exposed [name] ([id number]) Essential (primary) hypertension Atherosclerotic heart disease of native coronary artery without angina pectoris Procedures Wound #1 Pre-procedure diagnosis of Wound #1 is a Venous Leg Ulcer located on the Left Lower Leg .Severity of Tissue Pre Debridement is: Fat layer  exposed. There was a Chemical/Enzymatic/Mechanical debridement performed by STONE III, Alisea Matte E., PA-C. With the following instrument(s): saline and gauze after achieving pain control using Lidocaine. Other agent used was saline and gauze. A time out was conducted at 10:05, prior to the  start of the procedure. A Moderate amount of bleeding was controlled with N/A. The procedure was tolerated well. Post Debridement Measurements: 1.5cm length x 1.5cm width x 0.1cm depth; 0.177cm^3 volume. Character of Wound/Ulcer Post Debridement is stable. Severity of Tissue Post Debridement is: Fat layer exposed. Post procedure Diagnosis Wound #1: Same as Pre-Procedure Plan Wound Cleansing: Wound #1 Left Lower Leg: Clean wound with Normal Saline. - in office Cleanse wound with mild soap and water Primary Wound Dressing: Wound #1 Left Lower Leg: Silver Collagen Secondary Dressing: Wound #1 Left Lower Leg: ABD pad ABD and Kerlix/Conform - conform Dressing Change Frequency: Wound #1 Left Lower Leg: Change dressing every other day. Follow-up Appointments: Wound #1 Left Lower Leg: Return Appointment in 1 week. Edema Control: Wound #1 Left Lower Leg: Other: - tubi grip F 1. My suggestion at this time is good to be that we initiate treatment with wound care measures including silver collagen to the wound bed. I think that an ABD pad over top and con form to secure in place will be appropriate. 2. I really think the patient needs a compression wrap. However for now we will go ahead and recommend that we just use Tubigrip since he is going to have an arterial studies later today we may be able to wrap him next week. 3. I am in a suggest as well elevation is much as possible to keep edema under control. Obviously I think this is of utmost importance as far as preventing further breakdown issues going forward. TAMAJ, JURGENS ([id number]) We will see patient back for reevaluation in 1 week here in the clinic. If anything worsens or changes patient will contact our office for additional recommendations. Electronic Signature(s) Signed: 03/01/2019 3:16:54 PM By: Lenda Kelp PA-C Entered By: Lenda Kelp on 03/01/2019 15:16:54 Jon Garner  ([id number]) -------------------------------------------------------------------------------- ROS/PFSH Details Patient Name: Jon Garner Date of Service: 03/01/2019 9:45 AM Medical Record Number: [id number] Patient Account Number: [id number] Date of Birth/Sex: 24-Mar-1965 (54 y.o. M) Treating RN: Rodell Perna Primary Care Provider: Megan Mans Other Clinician: Referring Provider: END, CHRISTOPHER Treating Provider/Extender: STONE III, Liana Camerer Weeks in Treatment: 0 Information Obtained From Patient Constitutional Symptoms (General Health) Complaints and Symptoms: Negative for: Fatigue; Fever; Chills; Marked Weight Change Eyes Complaints and Symptoms: Positive for: Glasses / Contacts - glasses Negative for: Dry Eyes; Vision Changes Review of System Notes: macular degeneration Medical History: Negative for: Cataracts; Glaucoma; Optic Neuritis Ear/Nose/Mouth/Throat Complaints and Symptoms: Negative for: Difficult clearing ears; Sinusitis Medical History: Negative for: Chronic sinus problems/congestion; Middle ear problems Hematologic/Lymphatic Complaints and Symptoms: Negative for: Bleeding / Clotting Disorders; Human Immunodeficiency Virus Medical History: Negative for: Anemia; Hemophilia; Human Immunodeficiency Virus; Lymphedema; Sickle Cell Disease Respiratory Complaints and Symptoms: Negative for: Chronic or frequent coughs; Shortness of Breath Medical History: Negative for: Aspiration; Asthma; Chronic Obstructive Pulmonary Disease (COPD); Pneumothorax; Sleep Apnea; Tuberculosis Cardiovascular Complaints and Symptoms: Negative for: Chest pain; LE edema Medical History: Positive for: Coronary Artery Disease; Hypertension; Myocardial Infarction - 2019 ARNO, CULLERS ([id number]) Negative for: Angina; Arrhythmia; Congestive Heart Failure; Deep Vein Thrombosis; Hypotension; Peripheral Arterial Disease; Peripheral Venous Disease; Phlebitis;  Vasculitis Gastrointestinal Complaints and Symptoms: Negative for: Frequent diarrhea; Nausea; Vomiting Medical History: Negative for: Cirrhosis ;  Colitis; Crohnos; Hepatitis A; Hepatitis B; Hepatitis C Endocrine Complaints and Symptoms: Negative for: Hepatitis; Thyroid disease; Polydypsia (Excessive Thirst) Medical History: Negative for: Type I Diabetes; Type II Diabetes Genitourinary Complaints and Symptoms: Negative for: Kidney failure/ Dialysis; Incontinence/dribbling Medical History: Negative for: End Stage Renal Disease Immunological Complaints and Symptoms: Negative for: Hives; Itching Medical History: Negative for: Lupus Erythematosus; Raynaudos; Scleroderma Integumentary (Skin) Complaints and Symptoms: Positive for: Wounds Negative for: Bleeding or bruising tendency; Breakdown; Swelling Medical History: Negative for: History of Burn; History of pressure wounds Musculoskeletal Complaints and Symptoms: Negative for: Muscle Pain; Muscle Weakness Medical History: Negative for: Gout; Rheumatoid Arthritis; Osteoarthritis; Osteomyelitis Neurologic Complaints and Symptoms: Negative for: Numbness/parasthesias; Focal/Weakness Medical History: Negative for: Dementia; Neuropathy; Quadriplegia; Paraplegia; Seizure Disorder MINDY, BEHNKEN ([id number]) Psychiatric Complaints and Symptoms: Negative for: Anxiety; Claustrophobia Medical History: Negative for: Anorexia/bulimia Oncologic Medical History: Negative for: Received Chemotherapy; Received Radiation Immunizations Pneumococcal Vaccine: Received Pneumococcal Vaccination: No Tetanus Vaccine: Last tetanus shot: 02/05/2012 Implantable Devices None Family and Social History Cancer: Yes - Siblings; Diabetes: Yes - Siblings; Heart Disease: Yes - Father; Hereditary Spherocytosis: No; Hypertension: Yes - Siblings,Mother,Father; Kidney Disease: Yes - Siblings; Lung Disease: No; Seizures: No; Stroke: Yes -  Maternal Grandparents; Thyroid Problems: Yes - Siblings; Tuberculosis: No; Former smoker - 6 years ago; Marital Status - Single; Alcohol Use: Rarely; Drug Use: No History; Caffeine Use: Daily; Financial Concerns: No; Food, Clothing or Shelter Needs: No; Support System Lacking: No; Transportation Concerns: No Electronic Signature(s) Signed: 03/01/2019 12:59:59 PM By: Rodell Perna Signed: 03/01/2019 4:16:35 PM By: Lenda Kelp PA-C Entered By: Rodell Perna on 03/01/2019 09:57:02 Jon Garner ([id number]) -------------------------------------------------------------------------------- SuperBill Details Patient Name: Jon Garner. Date of Service: 03/01/2019 Medical Record Number: [id number] Patient Account Number: [id number] Date of Birth/Sex: 21-May-1965 (54 y.o. M) Treating RN: Huel Coventry Primary Care Provider: Wendelyn Breslow, Gerlene Burdock Other Clinician: Referring Provider: END, CHRISTOPHER Treating Provider/Extender: Linwood Dibbles, Semaj Coburn Weeks in Treatment: 0 Diagnosis Coding ICD-10 Codes Code Description I87.2 Venous insufficiency (chronic) (peripheral) L97.822 Non-pressure chronic ulcer of other part of left lower leg with fat layer exposed I10 Essential (primary) hypertension I25.10 Atherosclerotic heart disease of native coronary artery without angina pectoris Facility Procedures CPT4 Code: 75883254 Description: 99214 - WOUND CARE VISIT-LEV 4 EST PT Modifier: Quantity: 1 Physician Procedures CPT4 Code Description: 9826415 WC PHYS LEVEL 3 o NEW PT ICD-10 Diagnosis Description I87.2 Venous insufficiency (chronic) (peripheral) 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

## 2019-03-01 NOTE — Progress Notes (Signed)
SELDEN, NOTEBOOM ([id number]) Visit Report for 03/01/2019 Abuse/Suicide Risk Screen Details Patient Name: Jon Garner, Jon Garner. Date of Service: 03/01/2019 9:45 AM Medical Record Number: [id number] Patient Account Number: [id number] Date of Birth/Sex: [birth year] (54 y.o. M) Treating RN: Rodell Perna Primary Care Elisabel Hanover: Megan Mans Other Clinician: Referring Martez Weiand: END, CHRISTOPHER Treating Jacinta Penalver/Extender: STONE III, HOYT Weeks in Treatment: 0 Abuse/Suicide Risk Screen Items Answer ABUSE RISK SCREEN: Has anyone close to you tried to hurt or harm you recentlyo No Do you feel uncomfortable with anyone in your familyo No Has anyone forced you do things that you didnot want to doo No Electronic Signature(s) Signed: 03/01/2019 12:59:59 PM By: Rodell Perna Entered By: Rodell Perna on 03/01/2019 09:57:11 Jon Garner ([id number]) -------------------------------------------------------------------------------- Activities of Daily Living Details Patient Name: Jon Garner. Date of Service: 03/01/2019 9:45 AM Medical Record Number: [id number] Patient Account Number: [id number] Date of Birth/Sex: 1965/10/28 (54 y.o. M) Treating RN: Rodell Perna Primary Care Shayma Pfefferle: Megan Mans Other Clinician: Referring Detrell Umscheid: END, CHRISTOPHER Treating Zyra Parrillo/Extender: STONE III, HOYT Weeks in Treatment: 0 Activities of Daily Living Items Answer Activities of Daily Living (Please select one for each item) Drive Automobile Completely Able Take Medications Completely Able Use Telephone Completely Able Care for Appearance Completely Able Use Toilet Completely Able Bath / Shower Completely Able Dress Self Completely Able Feed Self Completely Able Walk Completely Able Get In / Out Bed Completely Able Housework Completely Able Prepare Meals Completely Able Handle Money Completely Able Shop for Self Completely Able Electronic Signature(s) Signed: 03/01/2019 12:59:59 PM By:  Rodell Perna Entered By: Rodell Perna on 03/01/2019 09:57:24 Jon Garner ([id number]) -------------------------------------------------------------------------------- Education Screening Details Patient Name: Jon Garner Date of Service: 03/01/2019 9:45 AM Medical Record Number: [id number] Patient Account Number: [id number] Date of Birth/Sex: 1966-01-06 (54 y.o. M) Treating RN: Rodell Perna Primary Care Mykel Mohl: Megan Mans Other Clinician: Referring Zanae Kuehnle: END, CHRISTOPHER Treating Maryjean Corpening/Extender: Linwood Dibbles, HOYT Weeks in Treatment: 0 Primary Learner Assessed: Patient Learning Preferences/Education Level/Primary Language Learning Preference: Explanation Highest Education Level: College or Above Preferred Language: English Cognitive Barrier Language Barrier: No Translator Needed: No Memory Deficit: No Emotional Barrier: No Cultural/Religious Beliefs Affecting Medical Care: No Physical Barrier Impaired Vision: No Impaired Hearing: No Decreased Hand dexterity: No Knowledge/Comprehension Knowledge Level: High Comprehension Level: High Ability to understand written High instructions: Ability to understand verbal High instructions: Motivation Anxiety Level: Calm Cooperation: Cooperative Education Importance: Acknowledges Need Interest in Health Problems: Asks Questions Perception: Coherent Willingness to Engage in Self- High Management Activities: Readiness to Engage in Self- High Management Activities: Electronic Signature(s) Signed: 03/01/2019 12:59:59 PM By: Rodell Perna Entered By: Rodell Perna on 03/01/2019 09:57:46 Jon Garner ([id number]) -------------------------------------------------------------------------------- Fall Risk Assessment Details Patient Name: Jon Garner. Date of Service: 03/01/2019 9:45 AM Medical Record Number: [id number] Patient Account Number: [id number] Date of Birth/Sex: 10-02-1965 (54 y.o.  M) Treating RN: Rodell Perna Primary Care Celese Banner: Megan Mans Other Clinician: Referring Darrold Bezek: END, CHRISTOPHER Treating Jovon Winterhalter/Extender: Linwood Dibbles, HOYT Weeks in Treatment: 0 Fall Risk Assessment Items Have you had 2 or more falls in the last 12 monthso 0 No Have you had any fall that resulted in injury in the last 12 monthso 0 No FALLS RISK SCREEN History of falling - immediate or within 3 months 0 No Secondary diagnosis (Do you have 2 or more medical diagnoseso) 0 No Ambulatory aid None/bed rest/wheelchair/nurse 0 No Crutches/cane/walker 0 No Furniture 0 No Intravenous therapy Access/Saline/Heparin Lock 0 No Gait/Transferring Normal/ bed rest/  wheelchair 0 No Weak (short steps with or without shuffle, stooped but able to lift head while 0 No walking, may seek support from furniture) Impaired (short steps with shuffle, may have difficulty arising from chair, head 0 No down, impaired balance) Mental Status Oriented to own ability 0 No Electronic Signature(s) Signed: 03/01/2019 12:59:59 PM By: Army Melia Entered By: Army Melia on 03/01/2019 09:57:54 Jon Garner ([id number]) -------------------------------------------------------------------------------- Foot Assessment Details Patient Name: Jon Garner. Date of Service: 03/01/2019 9:45 AM Medical Record Number: [id number] Patient Account Number: [id number] Date of Birth/Sex: 1965/07/07 (54 y.o. M) Treating RN: Army Melia Primary Care Jordie Skalsky: Wilhemena Durie Other Clinician: Referring Kalli Greenfield: END, CHRISTOPHER Treating Lovena Kluck/Extender: STONE III, HOYT Weeks in Treatment: 0 Foot Assessment Items Site Locations + = Sensation present, - = Sensation absent, C = Callus, U = Ulcer R = Redness, W = Warmth, M = Maceration, PU = Pre-ulcerative lesion F = Fissure, S = Swelling, D = Dryness Assessment Right: Left: Other Deformity: No No Prior Foot Ulcer: No No Prior Amputation: No  No Charcot Joint: No No Ambulatory Status: Ambulatory Without Help Gait: Steady Electronic Signature(s) Signed: 03/01/2019 12:59:59 PM By: Army Melia Entered By: Army Melia on 03/01/2019 09:58:13 Jon Garner ([id number]) -------------------------------------------------------------------------------- Nutrition Risk Screening Details Patient Name: Jon Garner. Date of Service: 03/01/2019 9:45 AM Medical Record Number: [id number] Patient Account Number: [id number] Date of Birth/Sex: September 11, 1965 (54 y.o. M) Treating RN: Army Melia Primary Care Vong Garringer: Wilhemena Durie Other Clinician: Referring Clyde Zarrella: END, CHRISTOPHER Treating Minard Millirons/Extender: STONE III, HOYT Weeks in Treatment: 0 Height (in): 67 Weight (lbs): 280 Body Mass Index (BMI): 43.8 Nutrition Risk Screening Items Score Screening NUTRITION RISK SCREEN: I have an illness or condition that made me change the kind and/or amount of 0 No food I eat I eat fewer than two meals per day 0 No I eat few fruits and vegetables, or milk products 0 No I have three or more drinks of beer, liquor or wine almost every day 0 No I have tooth or mouth problems that make it hard for me to eat 0 No I don't always have enough money to buy the food I need 0 No I eat alone most of the time 0 No I take three or more different prescribed or over-the-counter drugs a day 0 No Without wanting to, I have lost or gained 10 pounds in the last six months 0 No I am not always physically able to shop, cook and/or feed myself 0 No Nutrition Protocols Good Risk Protocol 0 No interventions needed Moderate Risk Protocol High Risk Proctocol Risk Level: Good Risk Score: 0 Electronic Signature(s) Signed: 03/01/2019 12:59:59 PM By: Army Melia Entered By: Army Melia on 03/01/2019 09:57:59

## 2019-03-01 NOTE — Progress Notes (Signed)
Jon Garner, Jon Garner ([id number]) Visit Report for 03/01/2019 Allergy List Details Patient Name: Jon Garner, Jon Garner. Date of Service: 03/01/2019 9:45 AM Medical Record Number: [id number] Patient Account Number: [id number] Date of Birth/Sex: 08-13-1965 (54 y.o. M) Treating RN: Rodell Perna Primary Care Neesa Knapik: Megan Mans Other Clinician: Referring Ranbir Chew: END, CHRISTOPHER Treating Tonisha Silvey/Extender: STONE III, HOYT Weeks in Treatment: 0 Allergies Active Allergies penicillin Allergy Notes Electronic Signature(s) Signed: 03/01/2019 12:59:59 PM By: Rodell Perna Entered By: Rodell Perna on 03/01/2019 09:52:31 Jon Garner ([id number]) -------------------------------------------------------------------------------- Arrival Information Details Patient Name: Jon Garner Date of Service: 03/01/2019 9:45 AM Medical Record Number: [id number] Patient Account Number: [id number] Date of Birth/Sex: 1965/03/04 (54 y.o. M) Treating RN: Rodell Perna Primary Care Mckenzy Salazar: Megan Mans Other Clinician: Referring Braniyah Besse: END, CHRISTOPHER Treating Geovanna Simko/Extender: Linwood Dibbles, HOYT Weeks in Treatment: 0 Visit Information Patient Arrived: Ambulatory Arrival Time: 09:45 Accompanied By: self Transfer Assistance: None Patient Identification Verified: Yes Electronic Signature(s) Signed: 03/01/2019 12:59:59 PM By: Rodell Perna Entered By: Rodell Perna on 03/01/2019 09:45:27 Jon Garner ([id number]) -------------------------------------------------------------------------------- Clinic Level of Care Assessment Details Patient Name: Jon Garner. Date of Service: 03/01/2019 9:45 AM Medical Record Number: [id number] Patient Account Number: [id number] Date of Birth/Sex: [birth year] (54 y.o. M) Treating RN: Rodell Perna Primary Care Kriss Ishler: Megan Mans Other Clinician: Referring Otis Burress: END, CHRISTOPHER Treating Hien Perreira/Extender: STONE III, HOYT Weeks in  Treatment: 0 Clinic Level of Care Assessment Items TOOL 2 Quantity Score []  - Use when only an EandM is performed on the INITIAL visit 0 ASSESSMENTS - Nursing Assessment / Reassessment X - General Physical Exam (combine w/ comprehensive assessment (listed just below) when 1 20 performed on new pt. evals) X- 1 25 Comprehensive Assessment (HX, ROS, Risk Assessments, Wounds Hx, etc.) ASSESSMENTS - Wound and Skin Assessment / Reassessment X - Simple Wound Assessment / Reassessment - one wound 1 5 []  - 0 Complex Wound Assessment / Reassessment - multiple wounds []  - 0 Dermatologic / Skin Assessment (not related to wound area) ASSESSMENTS - Ostomy and/or Continence Assessment and Care []  - Incontinence Assessment and Management 0 []  - 0 Ostomy Care Assessment and Management (repouching, etc.) PROCESS - Coordination of Care X - Simple Patient / Family Education for ongoing care 1 15 []  - 0 Complex (extensive) Patient / Family Education for ongoing care X- 1 10 Staff obtains , Records, Test Results / Process Orders []  - 0 Staff telephones HHA, Nursing Homes / Clarify orders / etc []  - 0 Routine Transfer to another Facility (non-emergent condition) []  - 0 Routine Hospital Admission (non-emergent condition) X- 1 15 New Admissions / / Ordering NPWT, Apligraf, etc. []  - 0 Emergency Hospital Admission (emergent condition) X- 1 10 Simple Discharge Coordination []  - 0 Complex (extensive) Discharge Coordination PROCESS - Special Needs []  - Pediatric / Minor Patient Management 0 []  - 0 Isolation Patient Management Jon Garner, Jon Garner ( ) []  - 0 Hearing / Language / Visual special needs []  - 0 Assessment of Community assistance (transportation, D/C planning, etc.) []  - 0 Additional assistance / Altered mentation []  - 0 Support Surface(s) Assessment (bed, cushion, seat, etc.) INTERVENTIONS - Wound Cleansing / Measurement X - Wound Imaging  (photographs - any number of wounds) 1 5 []  - 0 Wound Tracing (instead of photographs) X- 1 5 Simple Wound Measurement - one wound []  - 0 Complex Wound Measurement - multiple wounds X- 1 5 Simple Wound Cleansing - one wound []  - 0 Complex Wound Cleansing - multiple wounds  INTERVENTIONS - Wound Dressings []  - Small Wound Dressing one or multiple wounds 0 X- 1 15 Medium Wound Dressing one or multiple wounds []  - 0 Large Wound Dressing one or multiple wounds []  - 0 Application of Medications - injection INTERVENTIONS - Miscellaneous []  - External ear exam 0 []  - 0 Specimen Collection (cultures, biopsies, blood, body fluids, etc.) []  - 0 Specimen(s) / Culture(s) sent or taken to Lab for analysis []  - 0 Patient Transfer (multiple staff / Harrel Lemon Lift / Similar devices) []  - 0 Simple Staple / Suture removal (25 or less) []  - 0 Complex Staple / Suture removal (26 or more) []  - 0 Hypo / Hyperglycemic Management (close monitor of Blood Glucose) []  - 0 Ankle / Brachial Index (ABI) - do not check if billed separately Has the patient been seen at the hospital within the last three years: Yes Total Score: 130 Level Of Care: New/Established - Level 4 Electronic Signature(s) Signed: 03/01/2019 12:59:59 PM By: Army Melia Entered By: Army Melia on 03/01/2019 10:08:28 Jon Garner ([id number]) -------------------------------------------------------------------------------- Encounter Discharge Information Details Patient Name: Jon Garner. Date of Service: 03/01/2019 9:45 AM Medical Record Number: [id number] Patient Account Number: [id number] Date of Birth/Sex: 07-14-1965 (54 y.o. M) Treating RN: Army Melia Primary Care Kolina Kube: Wilhemena Durie Other Clinician: Referring Kambrey Hagger: END, CHRISTOPHER Treating Yamilet Mcfayden/Extender: Melburn Hake, HOYT Weeks in Treatment: 0 Encounter Discharge Information Items Post Procedure Vitals Discharge Condition: Stable Temperature (F):  99.2 Ambulatory Status: Ambulatory Pulse (bpm): 78 Discharge Destination: Home Respiratory Rate (breaths/min): 16 Transportation: Private Auto Blood Pressure (mmHg): 151/57 Accompanied By: self Schedule Follow-up Appointment: Yes Clinical Summary of Care: Electronic Signature(s) Signed: 03/01/2019 12:59:59 PM By: Army Melia Entered By: Army Melia on 03/01/2019 10:12:13 Jon Garner ([id number]) -------------------------------------------------------------------------------- Lower Extremity Assessment Details Patient Name: Jon Garner. Date of Service: 03/01/2019 9:45 AM Medical Record Number: [id number] Patient Account Number: [id number] Date of Birth/Sex: 1965/09/04 (54 y.o. M) Treating RN: Army Melia Primary Care Reubin Bushnell: Wilhemena Durie Other Clinician: Referring Lelah Rennaker: END, CHRISTOPHER Treating Larina Lieurance/Extender: STONE III, HOYT Weeks in Treatment: 0 Edema Assessment Assessed: [Left: No] [Right: No] Edema: [Left: Yes] [Right: No] Calf Left: Right: Point of Measurement: 32 cm From Medial Instep 47 cm 43 cm Ankle Left: Right: Point of Measurement: 13 cm From Medial Instep 29 cm 26 cm Vascular Assessment Pulses: Dorsalis Pedis Palpable: [Left:Yes] [Right:Yes] Electronic Signature(s) Signed: 03/01/2019 12:59:59 PM By: Army Melia Entered By: Army Melia on 03/01/2019 09:52:24 Jon Garner ([id number]) -------------------------------------------------------------------------------- Multi Wound Chart Details Patient Name: Jon Garner. Date of Service: 03/01/2019 9:45 AM Medical Record Number: [id number] Patient Account Number: [id number] Date of Birth/Sex: 03/06/1965 (54 y.o. M) Treating RN: Army Melia Primary Care Laraine Samet: Wilhemena Durie Other Clinician: Referring Rohen Kimes: END, CHRISTOPHER Treating London Nonaka/Extender: STONE III, HOYT Weeks in Treatment: 0 Vital Signs Height(in): 67 Pulse(bpm): 78 Weight(lbs): 280 Blood  Pressure(mmHg): 157/57 Body Mass Index(BMI): 44 Temperature(F): 99.2 Respiratory Rate 16 (breaths/min): Photos: [N/A:N/A] Wound Location: Left Lower Leg N/A N/A Wounding Event: Blister N/A N/A Primary Etiology: Venous Leg Ulcer N/A N/A Date Acquired: 12/30/2018 N/A N/A Weeks of Treatment: 0 N/A N/A Wound Status: Open N/A N/A Measurements L x W x D 1.5x1.5x0.1 N/A N/A (cm) Area (cm) : 1.767 N/A N/A Volume (cm) : 0.177 N/A N/A Classification: Full Thickness Without N/A N/A Exposed Support Structures Exudate Amount: Medium N/A N/A Exudate Type: Serous N/A N/A Exudate Color: amber N/A N/A Granulation Amount: Small (1-33%) N/A N/A Granulation Quality: Pink N/A N/A  Necrotic Amount: Large (67-100%) N/A N/A Exposed Structures: Fat Layer (Subcutaneous N/A N/A Tissue) Exposed: Yes Fascia: No Tendon: No Muscle: No Joint: No Bone: No Epithelialization: None N/A N/A Treatment Notes Jon Garner, Jon Garner ([id number]) Electronic Signature(s) Signed: 03/01/2019 12:59:59 PM By: Rodell Perna Entered By: Rodell Perna on 03/01/2019 10:03:12 Jon Garner ([id number]) -------------------------------------------------------------------------------- Multi-Disciplinary Care Plan Details Patient Name: Jon Garner. Date of Service: 03/01/2019 9:45 AM Medical Record Number: [id number] Patient Account Number: [id number] Date of Birth/Sex: January 28, 1966 (54 y.o. M) Treating RN: Rodell Perna Primary Care Neoma Uhrich: Megan Mans Other Clinician: Referring Malcom Selmer: END, CHRISTOPHER Treating Jeaneen Cala/Extender: Linwood Dibbles, HOYT Weeks in Treatment: 0 Active Inactive Orientation to the Wound Care Program Nursing Diagnoses: Knowledge deficit related to the wound healing center program Goals: Patient/caregiver will verbalize understanding of the Wound Healing Center Program Date Initiated: 03/01/2019 Target Resolution Date: 03/12/2019 Goal Status: Active Interventions: Provide education on  orientation to the wound center Notes: Venous Leg Ulcer Nursing Diagnoses: Potential for venous Insuffiency (use before diagnosis confirmed) Goals: Patient will maintain optimal edema control Date Initiated: 03/01/2019 Target Resolution Date: 03/12/2019 Goal Status: Active Interventions: Assess peripheral edema status every visit. Notes: Wound/Skin Impairment Nursing Diagnoses: Impaired tissue integrity Goals: Ulcer/skin breakdown will have a volume reduction of 30% by week 4 Date Initiated: 03/01/2019 Target Resolution Date: 03/19/2019 Goal Status: Active Interventions: Assess ulceration(s) every visit Jon Garner, Jon Garner ([id number]) Notes: Electronic Signature(s) Signed: 03/01/2019 12:59:59 PM By: Rodell Perna Entered By: Rodell Perna on 03/01/2019 10:02:43 Jon Garner ([id number]) -------------------------------------------------------------------------------- Pain Assessment Details Patient Name: Jon Garner. Date of Service: 03/01/2019 9:45 AM Medical Record Number: [id number] Patient Account Number: [id number] Date of Birth/Sex: 02-26-1965 (54 y.o. M) Treating RN: Rodell Perna Primary Care Callan Norden: Megan Mans Other Clinician: Referring Penni Penado: END, CHRISTOPHER Treating Xzavian Semmel/Extender: Linwood Dibbles, HOYT Weeks in Treatment: 0 Active Problems Location of Pain Severity and Description of Pain Patient Has Paino No Site Locations Pain Management and Medication Current Pain Management: Electronic Signature(s) Signed: 03/01/2019 12:59:59 PM By: Rodell Perna Entered By: Rodell Perna on 03/01/2019 09:45:35 Jon Garner ([id number]) -------------------------------------------------------------------------------- Patient/Caregiver Education Details Patient Name: Jon Garner. Date of Service: 03/01/2019 9:45 AM Medical Record Number: [id number] Patient Account Number: [id number] Date of Birth/Gender: 1965/05/15 (54 y.o. M) Treating RN: Rodell Perna Primary Care Physician: Megan Mans Other Clinician: Referring Physician: END, CHRISTOPHER Treating Physician/Extender: Skeet Simmer in Treatment: 0 Education Assessment Education Provided To: Patient Education Topics Provided Wound/Skin Impairment: Handouts: Caring for Your Ulcer Methods: Demonstration, Explain/Verbal Responses: State content correctly Electronic Signature(s) Signed: 03/01/2019 12:59:59 PM By: Rodell Perna Entered By: Rodell Perna on 03/01/2019 10:08:43 Jon Garner ([id number]) -------------------------------------------------------------------------------- Wound Assessment Details Patient Name: Jon Garner. Date of Service: 03/01/2019 9:45 AM Medical Record Number: [id number] Patient Account Number: [id number] Date of Birth/Sex: 07/15/1965 (54 y.o. M) Treating RN: Rodell Perna Primary Care Clary Meeker: Megan Mans Other Clinician: Referring Kilie Rund: END, CHRISTOPHER Treating Jacklyne Baik/Extender: STONE III, HOYT Weeks in Treatment: 0 Wound Status Wound Number: 1 Primary Etiology: Venous Leg Ulcer Wound Location: Left Lower Leg Wound Status: Open Wounding Event: Blister Date Acquired: 12/30/2018 Weeks Of Treatment: 0 Clustered Wound: No Photos Wound Measurements Length: (cm) 1.5 Width: (cm) 1.5 Depth: (cm) 0.1 Area: (cm) 1.767 Volume: (cm) 0.177 % Reduction in Area: % Reduction in Volume: Epithelialization: None Tunneling: No Undermining: No Wound Description Full Thickness Without Exposed Support Foul Classification: Structures Sloug Exudate Medium Amount: Exudate Type: Serous Exudate Color: amber Odor After Cleansing: No  h/Fibrino Yes Wound Bed Granulation Amount: Small (1-33%) Exposed Structure Granulation Quality: Pink Fascia Exposed: No Necrotic Amount: Large (67-100%) Fat Layer (Subcutaneous Tissue) Exposed: Yes Necrotic Quality: Adherent Slough Tendon Exposed: No Muscle Exposed: No Joint  Exposed: No Bone Exposed: No Treatment Notes Jon Garner, Jon Garner ([id number]) Wound #1 (Left Lower Leg) Notes prisma, ADB, conform, Joyce Gross f Electronic Signature(s) Signed: 03/01/2019 12:59:59 PM By: Rodell Perna Entered By: Rodell Perna on 03/01/2019 09:50:08 Jon Garner ([id number]) -------------------------------------------------------------------------------- Vitals Details Patient Name: Jon Garner. Date of Service: 03/01/2019 9:45 AM Medical Record Number: [id number] Patient Account Number: [id number] Date of Birth/Sex: 11/03/1965 (54 y.o. M) Treating RN: Rodell Perna Primary Care Jordyn Doane: Megan Mans Other Clinician: Referring Brooklen Runquist: END, CHRISTOPHER Treating Kharizma Lesnick/Extender: STONE III, HOYT Weeks in Treatment: 0 Vital Signs Time Taken: 09:45 Temperature (F): 99.2 Height (in): 67 Pulse (bpm): 78 Source: Stated Respiratory Rate (breaths/min): 16 Weight (lbs): 280 Blood Pressure (mmHg): 157/57 Source: Stated Reference Range: 80 - 120 mg / dl Body Mass Index (BMI): 43.8 Electronic Signature(s) Signed: 03/01/2019 12:59:59 PM By: Rodell Perna Entered By: Rodell Perna on 03/01/2019 09:46:10

## 2019-03-08 ENCOUNTER — Encounter: Payer: Managed Care, Other (non HMO) | Attending: Physician Assistant | Admitting: Physician Assistant

## 2019-03-08 ENCOUNTER — Other Ambulatory Visit: Payer: Self-pay

## 2019-03-08 DIAGNOSIS — I872 Venous insufficiency (chronic) (peripheral): Secondary | ICD-10-CM | POA: Insufficient documentation

## 2019-03-08 DIAGNOSIS — L97822 Non-pressure chronic ulcer of other part of left lower leg with fat layer exposed: Secondary | ICD-10-CM | POA: Insufficient documentation

## 2019-03-08 DIAGNOSIS — I10 Essential (primary) hypertension: Secondary | ICD-10-CM | POA: Insufficient documentation

## 2019-03-08 DIAGNOSIS — I2510 Atherosclerotic heart disease of native coronary artery without angina pectoris: Secondary | ICD-10-CM | POA: Diagnosis not present

## 2019-03-08 NOTE — Progress Notes (Addendum)
Jon, Garner ([id number]) Visit Report for 03/08/2019 Chief Complaint Document Details Patient Name: Jon Garner, Jon Garner. Date of Service: 03/08/2019 9:30 AM Medical Record Number: [id number] Patient Account Number: [id number] Date of Birth/Sex: [birth year] (54 y.o. M) Treating RN: Cornell Barman Primary Care Provider: Cranford Mon, Delfino Lovett Other Clinician: Referring Provider: Cranford Mon, Delfino Lovett Treating Provider/Extender: Sharalyn Ink in Treatment: 1 Information Obtained from: Patient Chief Complaint Left LE Ulcer Electronic Signature(s) Signed: 03/08/2019 9:43:11 AM By: Worthy Keeler PA-C Entered By: Worthy Keeler on 03/08/2019 09:43:11 Jon Garner ([id number]) -------------------------------------------------------------------------------- HPI Details Patient Name: Jon Garner Date of Service: 03/08/2019 9:30 AM Medical Record Number: [id number] Patient Account Number: [id number] Date of Birth/Sex: May 15, 1965 (54 y.o. M) Treating RN: Cornell Barman Primary Care Provider: Cranford Mon, Delfino Lovett Other Clinician: Referring Provider: Cranford Mon, Delfino Lovett Treating Provider/Extender: Worthy Keeler Weeks in Treatment: 1 History of Present Illness HPI Description: 03/01/2019 upon evaluation today patient presents for initial inspection here in our clinic concerning issues he has been having for the past 2 months with a wound over the left lower leg anteriorly. He states that this just came up he really did not have any injury he is not sure what exactly caused that other than his legs are always "very tight". He has been referred by Dr. Harrell Gave End for arterial studies and actually has those later today. He also has a history of chronic venous insufficiency, hypertension, and coronary artery disease. Subsequently he is also gotten compression stockings in the past 2 weeks although obviously somewhat hard to put that on on the left side being that again he has significant issues  with swelling on top of the fact has been having put a dressing over this area as well that makes even that much more difficult. Nonetheless he does need good compression wrap possibly even a 4-layer compression wrap to get some of the edema down but we need to make sure his arterial flow is good prior to that. Fortunately there is no evidence of active infection. No fevers, chills, nausea, vomiting, or diarrhea. 03/08/2019 upon evaluation today patient appears to be doing excellent with regard to his lower extremity ulcers. In fact when we were managing last week seems to be doing much better and in fact is dramatically improved even compared to previous. With that being said his current issue is that he has a couple of the blisters that have reopened and that is more recent. With that being said I do believe that we can continue to manage this and I believe now that we know he has sufficient arterial flow we can even increase the compression to a 3 layer compression wrap which should be of benefit. Electronic Signature(s) Signed: 03/08/2019 1:01:53 PM By: Worthy Keeler PA-C Entered By: Worthy Keeler on 03/08/2019 13:01:52 Jon Garner ([id number]) -------------------------------------------------------------------------------- Physical Exam Details Patient Name: Jon Garner Date of Service: 03/08/2019 9:30 AM Medical Record Number: [id number] Patient Account Number: [id number] Date of Birth/Sex: 08/06/1965 (54 y.o. M) Treating RN: Cornell Barman Primary Care Provider: Cranford Mon, Delfino Lovett Other Clinician: Referring Provider: Cranford Mon, RICHARD Treating Provider/Extender: Melburn Hake, Demetry Bendickson Weeks in Treatment: 1 Constitutional Well-nourished and well-hydrated in no acute distress. Respiratory normal breathing without difficulty. Psychiatric this patient is able to make decisions and demonstrates good insight into disease process. Alert and Oriented x 3. pleasant and  cooperative. Notes Patient's wounds currently are very superficial do not need any sharp debridement and show no signs of significant  worsening all which is great news. I am very pleased with the progress up to this point. I think with appropriate compression he will do even better obtaining complete healing. Electronic Signature(s) Signed: 03/08/2019 1:02:15 PM By: Lenda Kelp PA-C Entered By: Lenda Kelp on 03/08/2019 13:02:15 [name] ([id number]) -------------------------------------------------------------------------------- Physician Orders Details Patient Name: [name] Date of Service: 03/08/2019 9:30 AM Medical Record Number: [id number] Patient Account Number: [id number] Date of Birth/Sex: [birth year] (54 y.o. M) Treating RN: Huel Coventry Primary Care Provider: Wendelyn Breslow, Gerlene Burdock Other Clinician: Referring Provider: Wendelyn Breslow, Gerlene Burdock Treating Provider/Extender: Linwood Dibbles, Caroll Cunnington Weeks in Treatment: 1 Verbal / Phone Orders: No Diagnosis Coding ICD-10 Coding Code Description I87.2 Venous insufficiency (chronic) (peripheral) L97.822 Non-pressure chronic ulcer of other part of left lower leg with fat layer exposed I10 Essential (primary) hypertension I25.10 Atherosclerotic heart disease of native coronary artery without angina pectoris Wound Cleansing Wound #1 Left Lower Leg o Clean wound with Normal Saline. - in office o Cleanse wound with mild soap and water Primary Wound Dressing Wound #1 Left Lower Leg o Silver Collagen Secondary Dressing Wound #1 Left Lower Leg o ABD pad Dressing Change Frequency Wound #1 Left Lower Leg o Change dressing every week Follow-up Appointments Wound #1 Left Lower Leg o Return Appointment in 1 week. o Nurse Visit as needed Edema Control Wound #1 Left Lower Leg o 3 Layer Compression System - Left Lower Extremity Electronic Signature(s) Signed: 03/08/2019 4:06:16 PM By: Lenda Kelp PA-C Signed:  03/11/2019 5:22:08 PM By: Elliot Gurney, BSN, RN, CWS, Kim RN, BSN Entered By: Elliot Gurney, BSN, RN, CWS, Kim on 03/08/2019 10:06:27 [name] ([id number]) -------------------------------------------------------------------------------- Problem List Details Patient Name: KYMONI, MONDAY. Date of Service: 03/08/2019 9:30 AM Medical Record Number: [id number] Patient Account Number: [id number] Date of Birth/Sex: 11/26/1965 (54 y.o. M) Treating RN: Huel Coventry Primary Care Provider: Wendelyn Breslow, Gerlene Burdock Other Clinician: Referring Provider: Wendelyn Breslow, Gerlene Burdock Treating Provider/Extender: Skeet Simmer in Treatment: 1 Active Problems ICD-10 Evaluated Encounter Code Description Active Date Today Diagnosis I87.2 Venous insufficiency (chronic) (peripheral) 03/01/2019 No Yes L97.822 Non-pressure chronic ulcer of other part of left lower leg with 03/01/2019 No Yes fat layer exposed I10 Essential (primary) hypertension 03/01/2019 No Yes I25.10 Atherosclerotic heart disease of native coronary artery 03/01/2019 No Yes without angina pectoris Inactive Problems Resolved Problems Electronic Signature(s) Signed: 03/08/2019 9:43:05 AM By: Lenda Kelp PA-C Entered By: Lenda Kelp on 03/08/2019 09:43:05 [name] ([id number]) -------------------------------------------------------------------------------- Progress Note Details Patient Name: [name] Date of Service: 03/08/2019 9:30 AM Medical Record Number: [id number] Patient Account Number: [id number] Date of Birth/Sex: April 18, 1965 (54 y.o. M) Treating RN: Huel Coventry Primary Care Provider: Wendelyn Breslow, Gerlene Burdock Other Clinician: Referring Provider: Wendelyn Breslow, Gerlene Burdock Treating Provider/Extender: Skeet Simmer in Treatment: 1 Subjective Chief Complaint Information obtained from Patient Left LE Ulcer History of Present Illness (HPI) 03/01/2019 upon evaluation today patient presents for initial inspection here in our clinic  concerning issues he has been having for the past 2 months with a wound over the left lower leg anteriorly. He states that this just came up he really did not have any injury he is not sure what exactly caused that other than his legs are always "very tight". He has been referred by Dr. Cristal Deer End for arterial studies and actually has those later today. He also has a history of chronic venous insufficiency, hypertension, and coronary artery disease. Subsequently he is also gotten compression stockings in  the past 2 weeks although obviously somewhat hard to put that on on the left side being that again he has significant issues with swelling on top of the fact has been having put a dressing over this area as well that makes even that much more difficult. Nonetheless he does need good compression wrap possibly even a 4-layer compression wrap to get some of the edema down but we need to make sure his arterial flow is good prior to that. Fortunately there is no evidence of active infection. No fevers, chills, nausea, vomiting, or diarrhea. 03/08/2019 upon evaluation today patient appears to be doing excellent with regard to his lower extremity ulcers. In fact when we were managing last week seems to be doing much better and in fact is dramatically improved even compared to previous. With that being said his current issue is that he has a couple of the blisters that have reopened and that is more recent. With that being said I do believe that we can continue to manage this and I believe now that we know he has sufficient arterial flow we can even increase the compression to a 3 layer compression wrap which should be of benefit. Objective Constitutional Well-nourished and well-hydrated in no acute distress. Vitals Time Taken: 9:45 AM, Height: 67 in, Weight: 280 lbs, BMI: 43.8, Temperature: 98.6 F, Pulse: 78 bpm, Respiratory Rate: 16 breaths/min, Blood Pressure: 130/63 mmHg. Respiratory normal  breathing without difficulty. Psychiatric this patient is able to make decisions and demonstrates good insight into disease process. Alert and Oriented x 3. pleasant and cooperative. AMIL, BOUWMAN ([id number]) General Notes: Patient's wounds currently are very superficial do not need any sharp debridement and show no signs of significant worsening all which is great news. I am very pleased with the progress up to this point. I think with appropriate compression he will do even better obtaining complete healing. Integumentary (Hair, Skin) Wound #1 status is Open. Original cause of wound was Blister. The wound is located on the Left Lower Leg. The wound measures 0.4cm length x 0.3cm width x 0.1cm depth; 0.094cm^2 area and 0.009cm^3 volume. There is Fat Layer (Subcutaneous Tissue) Exposed exposed. There is no tunneling or undermining noted. There is a medium amount of serous drainage noted. The wound margin is flat and intact. There is large (67-100%) pink granulation within the wound bed. There is a small (1-33%) amount of necrotic tissue within the wound bed including Adherent Slough. Wound #2 status is Open. Original cause of wound was Gradually Appeared. The wound is located on the Left,Proximal,Anterior Lower Leg. The wound measures 1.7cm length x 2.8cm width x 0.1cm depth; 3.738cm^2 area and 0.374cm^3 volume. There is Fat Layer (Subcutaneous Tissue) Exposed exposed. There is no tunneling or undermining noted. There is a medium amount of serous drainage noted. The wound margin is flat and intact. There is large (67-100%) pink granulation within the wound bed. There is no necrotic tissue within the wound bed. 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 - in office Cleanse wound with mild soap and water Primary Wound Dressing: Wound #1 Left Lower Leg: Silver Collagen Secondary Dressing: Wound #1 Left Lower Leg: ABD pad Dressing Change Frequency: Wound #1 Left Lower Leg: Change dressing every week Follow-up Appointments: Wound #  1 Left Lower Leg: Return Appointment in 1 week. Nurse Visit as needed Edema Control: Wound #1 Left Lower Leg: 3 Layer Compression System - Left Lower Extremity CAMERAN, PETTEY ([id number]) 1. My suggestion at this time is good immediately go to continue with the current wound care measures. That is with regard to the actual dressing we will use silver collagen. 2. I am also going to suggest that we increase to 3 layer compression wrapping although no he has sufficient arterial flow there was no signs of arterial insufficiency which is great news. 3. I recommend the patient continue to elevate his legs much as possible I still think this is a very important aspect of his treatment. We will see patient back for reevaluation in 1 week here in the clinic. If anything worsens or changes patient will contact our office for additional recommendations. Electronic Signature(s) Signed: 03/08/2019 1:02:54 PM By: Lenda Kelp PA-C Entered By: Lenda Kelp on 03/08/2019 13:02:54 [name] ([id number]) -------------------------------------------------------------------------------- SuperBill Details Patient Name: [name] Date of Service: 03/08/2019 Medical Record Number: [id number] Patient Account Number: [id number] Date of Birth/Sex: March 12, 1965 (54 y.o. M) Treating RN: Huel Coventry Primary Care Provider: Wendelyn Breslow, Gerlene Burdock Other Clinician: Referring Provider: Wendelyn Breslow, Gerlene Burdock Treating Provider/Extender: Linwood Dibbles, Ruchi Stoney Weeks in Treatment: 1 Diagnosis Coding ICD-10 Codes Code Description I87.2 Venous  insufficiency (chronic) (peripheral) L97.822 Non-pressure chronic ulcer of other part of left lower leg with fat layer exposed I10 Essential (primary) hypertension I25.10 Atherosclerotic heart disease of native coronary artery without angina pectoris Facility Procedures CPT4 Code: [id number] Description: (Facility Use Only) [phone] - APPLY MULTLAY COMPRS LWR LT LEG Modifier: Quantity: 1 Physician Procedures CPT4 Code Description: [id number] - WC PHYS LEVEL 3 - EST PT ICD-10 Diagnosis Description I87.2 Venous insufficiency (chronic) (peripheral) L97.822 Non-pressure chronic ulcer of other part of left lower leg wit I10 Essential (primary) hypertension  I25.10 Atherosclerotic heart disease of native coronary artery withou Modifier: h fat layer expos t angina pectoris Quantity: 1 ed Electronic Signature(s) Signed: 03/08/2019 1:03:10 PM By: Lenda Kelp PA-C Entered By: Lenda Kelp on 03/08/2019 13:03:10

## 2019-03-08 NOTE — Progress Notes (Addendum)
Jon Garner, Jon Garner ([id number]) Visit Report for 03/08/2019 Arrival Information Details Patient Name: Jon Garner, Jon Garner. Date of Service: 03/08/2019 9:30 AM Medical Record Number: [id number] Patient Account Number: [id number] Date of Birth/Sex: 06-Apr-1965 (54 y.o. M) Treating RN: Cornell Barman Primary Care Devansh Riese: Cranford Mon, Delfino Lovett Other Clinician: Referring Yohanna Tow: Cranford Mon, Delfino Lovett Treating Lyndell Allaire/Extender: Melburn Hake, HOYT Weeks in Treatment: 1 Visit Information History Since Last Visit Added or deleted any medications: No Patient Arrived: Ambulatory Any new allergies or adverse reactions: No Arrival Time: 09:43 Had a fall or experienced change in No Accompanied By: self activities of daily living that may affect Transfer Assistance: None risk of falls: Patient Identification Verified: Yes Signs or symptoms of abuse/neglect since last visito No Secondary Verification Process Completed: Yes Hospitalized since last visit: No Implantable device outside of the clinic excluding No cellular tissue based products placed in the center since last visit: Has Dressing in Place as Prescribed: Yes Pain Present Now: No Electronic Signature(s) Signed: 03/08/2019 3:33:21 PM By: Lorine Bears RCP, RRT, CHT Entered By: Lorine Bears on 03/08/2019 09:43:59 Jon Garner ([id number]) -------------------------------------------------------------------------------- Encounter Discharge Information Details Patient Name: Jon Garner. Date of Service: 03/08/2019 9:30 AM Medical Record Number: [id number] Patient Account Number: [id number] Date of Birth/Sex: 10-25-1965 (54 y.o. M) Treating RN: Cornell Barman Primary Care Britlee Skolnik: Cranford Mon, Delfino Lovett Other Clinician: Referring Dolan Xia: Wilhemena Durie Treating Chia Rock/Extender: Sharalyn Ink in Treatment: 1 Encounter Discharge Information Items Discharge Condition: Stable Ambulatory Status:  Ambulatory Discharge Destination: Home Transportation: Private Auto Accompanied By: self Schedule Follow-up Appointment: Yes Clinical Summary of Care: Electronic Signature(s) Signed: 03/11/2019 5:22:08 PM By: Gretta Cool, BSN, RN, CWS, Kim RN, BSN Entered By: Gretta Cool, BSN, RN, CWS, Kim on 03/08/2019 10:08:04 Jon Garner ([id number]) -------------------------------------------------------------------------------- Lower Extremity Assessment Details Patient Name: Jon Garner, Jon Garner. Date of Service: 03/08/2019 9:30 AM Medical Record Number: [id number] Patient Account Number: [id number] Date of Birth/Sex: 1965-07-31 (54 y.o. M) Treating RN: Montey Hora Primary Care Mariaha Ellington: Cranford Mon, RICHARD Other Clinician: Referring Jewell Haught: Cranford Mon, RICHARD Treating Janelis Stelzer/Extender: Melburn Hake, HOYT Weeks in Treatment: 1 Edema Assessment Assessed: [Left: No] [Right: No] Edema: [Left: Ye] [Right: s] Calf Left: Right: Point of Measurement: 32 cm From Medial Instep 45 cm cm Ankle Left: Right: Point of Measurement: 13 cm From Medial Instep 25 cm cm Vascular Assessment Pulses: Dorsalis Pedis Palpable: [Left:Yes] Blood Pressure: Brachial: [Left:125] [Right:126] Dorsalis Pedis: 124 [Left:Dorsalis Pedis: 122] Ankle: Posterior Tibial: 113 [Left:Posterior Tibial: 122 0.98] [Right:0.97] Notes ABI's from [facility] 03/01/2019 Electronic Signature(s) Signed: 03/08/2019 4:01:28 PM By: Montey Hora Signed: 03/11/2019 5:22:08 PM By: Gretta Cool, BSN, RN, CWS, Kim RN, BSN Entered By: Gretta Cool, BSN, RN, CWS, Kim on 03/08/2019 10:04:23 Jon Garner ([id number]) -------------------------------------------------------------------------------- Multi Wound Chart Details Patient Name: Jon Garner. Date of Service: 03/08/2019 9:30 AM Medical Record Number: [id number] Patient Account Number: [id number] Date of Birth/Sex: 1965/06/22 (54 y.o. M) Treating RN: Cornell Barman Primary Care Amity Roes: Cranford Mon, Delfino Lovett Other  Clinician: Referring Sukhman Kocher: Cranford Mon, Delfino Lovett Treating Karanvir Balderston/Extender: Melburn Hake, HOYT Weeks in Treatment: 1 Vital Signs Height(in): 67 Pulse(bpm): 78 Weight(lbs): 280 Blood Pressure(mmHg): 130/63 Body Mass Index(BMI): 44 Temperature(F): 98.6 Respiratory Rate 16 (breaths/min): Photos: [N/A:N/A] Wound Location: Left Lower Leg Left Lower Leg - Anterior, N/A Proximal Wounding Event: Blister Gradually Appeared N/A Primary Etiology: Venous Leg Ulcer Venous Leg Ulcer N/A Comorbid History: Coronary Artery Disease, Coronary Artery Disease, N/A Hypertension, Myocardial Hypertension, Myocardial Infarction Infarction Date Acquired: 12/30/2018 03/08/2019 N/A Weeks of Treatment: 1 0  N/A Wound Status: Open Open N/A Clustered Wound: No Yes N/A Clustered Quantity: N/A 2 N/A Measurements L x W x D 0.4x0.3x0.1 1.7x2.8x0.1 N/A (cm) Area (cm) : 0.094 3.738 N/A Volume (cm) : 0.009 0.374 N/A % Reduction in Area: 94.70% N/A N/A % Reduction in Volume: 94.90% N/A N/A Classification: Full Thickness Without Full Thickness Without N/A Exposed Support Structures Exposed Support Structures Exudate Amount: Medium Medium N/A Exudate Type: Serous Serous N/A Exudate Color: amber amber N/A Wound Margin: Flat and Intact Flat and Intact N/A Granulation Amount: Large (67-100%) Large (67-100%) N/A Granulation Quality: Pink Pink N/A Necrotic Amount: Small (1-33%) None Present (0%) N/A Exposed Structures: Fat Layer (Subcutaneous Fat Layer (Subcutaneous N/A Tissue) Exposed: Yes Tissue) Exposed: Yes WISSAM, RESOR ([id number]) Fascia: No Fascia: No Tendon: No Tendon: No Muscle: No Muscle: No Joint: No Joint: No Bone: No Bone: No Epithelialization: Medium (34-66%) None N/A Treatment Notes Electronic Signature(s) Signed: 03/11/2019 5:22:08 PM By: Elliot Gurney, BSN, RN, CWS, Kim RN, BSN Entered By: Elliot Gurney, BSN, RN, CWS, Kim on 03/08/2019 10:04:47 Jon Garner  ([id number]) -------------------------------------------------------------------------------- Multi-Disciplinary Care Plan Details Patient Name: Jon Garner, Jon Garner. Date of Service: 03/08/2019 9:30 AM Medical Record Number: [id number] Patient Account Number: [id number] Date of Birth/Sex: 10/06/1965 (54 y.o. M) Treating RN: Huel Coventry Primary Care Jerlene Rockers: Wendelyn Breslow, Gerlene Burdock Other Clinician: Referring Dianely Krehbiel: Megan Mans Treating Mirha Brucato/Extender: Linwood Dibbles, HOYT Weeks in Treatment: 1 Active Inactive Orientation to the Wound Care Program Nursing Diagnoses: Knowledge deficit related to the wound healing center program Goals: Patient/caregiver will verbalize understanding of the Wound Healing Center Program Date Initiated: 03/01/2019 Target Resolution Date: 03/12/2019 Goal Status: Active Interventions: Provide education on orientation to the wound center Notes: Venous Leg Ulcer Nursing Diagnoses: Potential for venous Insuffiency (use before diagnosis confirmed) Goals: Patient will maintain optimal edema control Date Initiated: 03/01/2019 Target Resolution Date: 03/12/2019 Goal Status: Active Interventions: Assess peripheral edema status every visit. Notes: Wound/Skin Impairment Nursing Diagnoses: Impaired tissue integrity Goals: Ulcer/skin breakdown will have a volume reduction of 30% by week 4 Date Initiated: 03/01/2019 Target Resolution Date: 03/19/2019 Goal Status: Active Interventions: Assess ulceration(s) every visit Jon Garner, Jon Garner ([id number]) Notes: Electronic Signature(s) Signed: 03/11/2019 5:22:08 PM By: Elliot Gurney, BSN, RN, CWS, Kim RN, BSN Entered By: Elliot Gurney, BSN, RN, CWS, Kim on 03/08/2019 10:04:40 Jon Garner ([id number]) -------------------------------------------------------------------------------- Pain Assessment Details Patient Name: Jon Garner. Date of Service: 03/08/2019 9:30 AM Medical Record Number: [id number] Patient Account Number:  [id number] Date of Birth/Sex: 1965/12/09 (54 y.o. M) Treating RN: Huel Coventry Primary Care Darrah Dredge: Wendelyn Breslow, Gerlene Burdock Other Clinician: Referring Karletta Millay: Wendelyn Breslow, Gerlene Burdock Treating Serita Degroote/Extender: Linwood Dibbles, HOYT Weeks in Treatment: 1 Active Problems Location of Pain Severity and Description of Pain Patient Has Paino No Site Locations Pain Management and Medication Current Pain Management: Electronic Signature(s) Signed: 03/08/2019 3:33:21 PM By: Dayton Martes RCP, RRT, CHT Signed: 03/11/2019 5:22:08 PM By: Elliot Gurney, BSN, RN, CWS, Kim RN, BSN Entered By: Dayton Martes on 03/08/2019 09:44:05 Jon Garner ([id number]) -------------------------------------------------------------------------------- Patient/Caregiver Education Details Patient Name: Jon Garner. Date of Service: 03/08/2019 9:30 AM Medical Record Number: [id number] Patient Account Number: [id number] Date of Birth/Gender: 11-19-1965 (54 y.o. M) Treating RN: Huel Coventry Primary Care Physician: Wendelyn Breslow, Gerlene Burdock Other Clinician: Referring Physician: Megan Mans Treating Physician/Extender: Skeet Simmer in Treatment: 1 Education Assessment Education Provided To: Patient Education Topics Provided Venous: Handouts: Controlling Swelling with Multilayered Compression Wraps Methods: Demonstration, Explain/Verbal Responses: State content correctly Electronic Signature(s) Signed: 03/11/2019 5:22:08  PM By: Elliot Gurney, BSN, RN, CWS, Kim RN, BSN Entered By: Elliot Gurney, BSN, RN, CWS, Kim on 03/08/2019 10:06:56 Jon Garner ([id number]) -------------------------------------------------------------------------------- Wound Assessment Details Patient Name: Jon Garner, Jon Garner. Date of Service: 03/08/2019 9:30 AM Medical Record Number: [id number] Patient Account Number: [id number] Date of Birth/Sex: 05/31/1965 (54 y.o. M) Treating RN: Curtis Sites Primary Care Janaisa Birkland: Wendelyn Breslow,  RICHARD Other Clinician: Referring Makinsley Schiavi: Megan Mans Treating Fama Muenchow/Extender: Linwood Dibbles, HOYT Weeks in Treatment: 1 Wound Status Wound Number: 1 Primary Venous Leg Ulcer Etiology: Wound Location: Left Lower Leg Wound Status: Open Wounding Event: Blister Comorbid Coronary Artery Disease, Hypertension, Date Acquired: 12/30/2018 History: Myocardial Infarction Weeks Of Treatment: 1 Clustered Wound: No Photos Wound Measurements Length: (cm) 0.4 Width: (cm) 0.3 Depth: (cm) 0.1 Area: (cm) 0.094 Volume: (cm) 0.009 % Reduction in Area: 94.7% % Reduction in Volume: 94.9% Epithelialization: Medium (34-66%) Tunneling: No Undermining: No Wound Description Full Thickness Without Exposed Support Foul Odo Classification: Structures Slough/F Wound Margin: Flat and Intact Exudate Medium Amount: Exudate Type: Serous Exudate Color: amber r After Cleansing: No ibrino Yes Wound Bed Granulation Amount: Large (67-100%) Exposed Structure Granulation Quality: Pink Fascia Exposed: No Necrotic Amount: Small (1-33%) Fat Layer (Subcutaneous Tissue) Exposed: Yes Necrotic Quality: Adherent Slough Tendon Exposed: No Muscle Exposed: No Joint Exposed: No Bone Exposed: No Jon Garner, Jon Garner ([id number]) Treatment Notes Wound #1 (Left Lower Leg) Notes prisma, ADB, 3LL Electronic Signature(s) Signed: 03/08/2019 4:01:28 PM By: Curtis Sites Entered By: Curtis Sites on 03/08/2019 09:54:44 Jon Garner ([id number]) -------------------------------------------------------------------------------- Wound Assessment Details Patient Name: Jon Garner. Date of Service: 03/08/2019 9:30 AM Medical Record Number: [id number] Patient Account Number: [id number] Date of Birth/Sex: 08-25-1965 (54 y.o. M) Treating RN: Curtis Sites Primary Care Soila Printup: Wendelyn Breslow, RICHARD Other Clinician: Referring Jenna Routzahn: Megan Mans Treating Meri Pelot/Extender: Linwood Dibbles, HOYT Weeks in  Treatment: 1 Wound Status Wound Number: 2 Primary Venous Leg Ulcer Etiology: Wound Location: Left Lower Leg - Anterior, Proximal Wound Status: Open Wounding Event: Gradually Appeared Comorbid Coronary Artery Disease, Hypertension, Date Acquired: 03/08/2019 History: Myocardial Infarction Weeks Of Treatment: 0 Clustered Wound: Yes Photos Wound Measurements Length: (cm) 1.7 Width: (cm) 2.8 Depth: (cm) 0.1 Clustered Quantity: 2 Area: (cm) 3.738 Volume: (cm) 0.374 % Reduction in Area: % Reduction in Volume: Epithelialization: None Tunneling: No Undermining: No Wound Description Full Thickness Without Exposed Support Foul O Classification: Structures Slough Wound Margin: Flat and Intact Exudate Medium Amount: Exudate Type: Serous Exudate Color: amber dor After Cleansing: No /Fibrino No Wound Bed Granulation Amount: Large (67-100%) Exposed Structure Granulation Quality: Pink Fascia Exposed: No Necrotic Amount: None Present (0%) Fat Layer (Subcutaneous Tissue) Exposed: Yes Tendon Exposed: No Muscle Exposed: No Joint Exposed: No Bone Exposed: No Jon Garner ([id number]) Treatment Notes Wound #2 (Left, Proximal, Anterior Lower Leg) Notes prisma, ADB, 3LL Electronic Signature(s) Signed: 03/08/2019 4:01:28 PM By: Curtis Sites Entered By: Curtis Sites on 03/08/2019 09:53:10 Jon Garner ([id number]) -------------------------------------------------------------------------------- Vitals Details Patient Name: Jon Garner. Date of Service: 03/08/2019 9:30 AM Medical Record Number: [id number] Patient Account Number: [id number] Date of Birth/Sex: 08/21/1965 (54 y.o. M) Treating RN: Huel Coventry Primary Care Jalina Blowers: Wendelyn Breslow, Gerlene Burdock Other Clinician: Referring Harlen Danford: Wendelyn Breslow, Gerlene Burdock Treating Takima Encina/Extender: Linwood Dibbles, HOYT Weeks in Treatment: 1 Vital Signs Time Taken: 09:45 Temperature (F): 98.6 Height (in): 67 Pulse (bpm): 78 Weight  (lbs): 280 Respiratory Rate (breaths/min): 16 Body Mass Index (BMI): 43.8 Blood Pressure (mmHg): 130/63 Reference Range: 80 - 120 mg / dl Electronic Signature(s) Signed: 03/08/2019  3:33:21 PM By: Dayton Martes RCP, RRT, CHT Entered By: Dayton Martes on 03/08/2019 09:47:11

## 2019-03-09 ENCOUNTER — Other Ambulatory Visit: Payer: Self-pay | Admitting: Internal Medicine

## 2019-03-09 DIAGNOSIS — R600 Localized edema: Secondary | ICD-10-CM

## 2019-03-09 DIAGNOSIS — I509 Heart failure, unspecified: Secondary | ICD-10-CM

## 2019-03-10 ENCOUNTER — Other Ambulatory Visit: Payer: Self-pay

## 2019-03-10 ENCOUNTER — Ambulatory Visit (INDEPENDENT_AMBULATORY_CARE_PROVIDER_SITE_OTHER): Payer: Managed Care, Other (non HMO)

## 2019-03-10 DIAGNOSIS — I509 Heart failure, unspecified: Secondary | ICD-10-CM

## 2019-03-10 DIAGNOSIS — R600 Localized edema: Secondary | ICD-10-CM | POA: Diagnosis not present

## 2019-03-15 ENCOUNTER — Encounter: Payer: Managed Care, Other (non HMO) | Admitting: Physician Assistant

## 2019-03-15 ENCOUNTER — Other Ambulatory Visit: Payer: Self-pay

## 2019-03-15 DIAGNOSIS — L97822 Non-pressure chronic ulcer of other part of left lower leg with fat layer exposed: Secondary | ICD-10-CM | POA: Diagnosis not present

## 2019-03-15 NOTE — Progress Notes (Addendum)
BARNEY, RUSSOMANNO ([id number]) Visit Report for 03/15/2019 Chief Complaint Document Details Patient Name: Jon Garner, Jon Garner. Date of Service: 03/15/2019 9:30 AM Medical Record Number: [id number] Patient Account Number: [id number] Date of Birth/Sex: 05-20-1965 (54 y.o. M) Treating RN: Primary Care Provider: Wilhemena Durie Other Clinician: Referring Provider: Wilhemena Durie Treating Provider/Extender: Sharalyn Ink in Treatment: 2 Information Obtained from: Patient Chief Complaint Left LE Ulcer Electronic Signature(s) Signed: 03/15/2019 9:47:28 AM By: Worthy Keeler PA-C Entered By: Worthy Keeler on 03/15/2019 09:47:28 Jon Garner ([id number]) -------------------------------------------------------------------------------- HPI Details Patient Name: Jon Garner Date of Service: 03/15/2019 9:30 AM Medical Record Number: [id number] Patient Account Number: [id number] Date of Birth/Sex: 1965-05-20 (54 y.o. M) Treating RN: Primary Care Provider: Wilhemena Durie Other Clinician: Referring Provider: Wilhemena Durie Treating Provider/Extender: Sharalyn Ink in Treatment: 2 History of Present Illness HPI Description: 03/01/2019 upon evaluation today patient presents for initial inspection here in our clinic concerning issues he has been having for the past 2 months with a wound over the left lower leg anteriorly. He states that this just came up he really did not have any injury he is not sure what exactly caused that other than his legs are always "very tight". He has been referred by Dr. Harrell Gave End for arterial studies and actually has those later today. He also has a history of chronic venous insufficiency, hypertension, and coronary artery disease. Subsequently he is also gotten compression stockings in the past 2 weeks although obviously somewhat hard to put that on on the left side being that again he has significant issues with swelling on top of  the fact has been having put a dressing over this area as well that makes even that much more difficult. Nonetheless he does need good compression wrap possibly even a 4-layer compression wrap to get some of the edema down but we need to make sure his arterial flow is good prior to that. Fortunately there is no evidence of active infection. No fevers, chills, nausea, vomiting, or diarrhea. 03/08/2019 upon evaluation today patient appears to be doing excellent with regard to his lower extremity ulcers. In fact when we were managing last week seems to be doing much better and in fact is dramatically improved even compared to previous. With that being said his current issue is that he has a couple of the blisters that have reopened and that is more recent. With that being said I do believe that we can continue to manage this and I believe now that we know he has sufficient arterial flow we can even increase the compression to a 3 layer compression wrap which should be of benefit. 03/15/2019 upon evaluation today patient appears to be doing excellent with regard to his wound. In fact the last remaining area is almost completely healed this is just a small blister that came up last week and again has almost closed back over. The original sore is completely healed and in general I think he has done excellent. I see no signs of active infection at this point. The patient is very pleased with how things seem to be progressing. Electronic Signature(s) Signed: 03/15/2019 5:32:04 PM By: Worthy Keeler PA-C Entered By: Worthy Keeler on 03/15/2019 17:32:03 Jon Garner ([id number]) -------------------------------------------------------------------------------- Physical Exam Details Patient Name: Jon Garner Date of Service: 03/15/2019 9:30 AM Medical Record Number: [id number] Patient Account Number: [id number] Date of Birth/Sex: May 12, 1965 (54 y.o. M) Treating RN: Primary Care Provider: Rosanna Randy  Emilio Aspen Other Clinician: Referring Provider: Wendelyn Breslow, RICHARD Treating Provider/Extender: Linwood Dibbles, Rickardo Brinegar Weeks in Treatment: 2 Constitutional Well-nourished and well-hydrated in no acute distress. Respiratory normal breathing without difficulty. Psychiatric this patient is able to make decisions and demonstrates good insight into disease process. Alert and Oriented x 3. pleasant and cooperative. Notes Upon inspection patient's wound bed again showed signs of almost complete resolution he just has 1 small area that had blistered last week remaining open and even this is barely visible. I think that given 1 more week of wrapping this will likely completely resolve. Electronic Signature(s) Signed: 03/15/2019 5:32:20 PM By: Lenda Kelp PA-C Entered By: Lenda Kelp on 03/15/2019 17:32:20 [name] ([id number]) -------------------------------------------------------------------------------- Physician Orders Details Patient Name: [name] Date of Service: 03/15/2019 9:30 AM Medical Record Number: [id number] Patient Account Number: [id number] Date of Birth/Sex: 04-20-1965 (54 y.o. M) Treating RN: Huel Coventry Primary Care Provider: Wendelyn Breslow, Gerlene Burdock Other Clinician: Referring Provider: Wendelyn Breslow, Gerlene Burdock Treating Provider/Extender: Skeet Simmer in Treatment: 2 Verbal / Phone Orders: No Diagnosis Coding ICD-10 Coding Code Description I87.2 Venous insufficiency (chronic) (peripheral) L97.822 Non-pressure chronic ulcer of other part of left lower leg with fat layer exposed I10 Essential (primary) hypertension I25.10 Atherosclerotic heart disease of native coronary artery without angina pectoris Wound Cleansing Wound #2 Left,Proximal,Anterior Lower Leg o Clean wound with Normal Saline. o Cleanse wound with mild soap and water o May Shower, gently pat wound dry prior to applying new dressing. Dressing Change Frequency Wound #2  Left,Proximal,Anterior Lower Leg o Change dressing every week Follow-up Appointments Wound #2 Left,Proximal,Anterior Lower Leg o Return Appointment in 1 week. Edema Control Wound #2 Left,Proximal,Anterior Lower Leg o 3 Layer Compression System - Right Lower Extremity o Elevate legs to the level of the heart and pump ankles as often as possible Electronic Signature(s) Signed: 03/15/2019 5:41:25 PM By: Lenda Kelp PA-C Signed: 03/16/2019 4:33:39 PM By: Elliot Gurney, BSN, RN, CWS, Kim RN, BSN Entered By: Elliot Gurney, BSN, RN, CWS, Kim on 03/15/2019 10:13:17 [name] ([id number]) -------------------------------------------------------------------------------- Problem List Details Patient Name: MARICUS, TANZI. Date of Service: 03/15/2019 9:30 AM Medical Record Number: [id number] Patient Account Number: [id number] Date of Birth/Sex: 01-24-1966 (54 y.o. M) Treating RN: Primary Care Provider: Megan Mans Other Clinician: Referring Provider: Wendelyn Breslow, Gerlene Burdock Treating Provider/Extender: Skeet Simmer in Treatment: 2 Active Problems ICD-10 Evaluated Encounter Code Description Active Date Today Diagnosis I87.2 Venous insufficiency (chronic) (peripheral) 03/01/2019 No Yes L97.822 Non-pressure chronic ulcer of other part of left lower leg with 03/01/2019 No Yes fat layer exposed I10 Essential (primary) hypertension 03/01/2019 No Yes I25.10 Atherosclerotic heart disease of native coronary artery 03/01/2019 No Yes without angina pectoris Inactive Problems Resolved Problems Electronic Signature(s) Signed: 03/15/2019 9:47:22 AM By: Lenda Kelp PA-C Entered By: Lenda Kelp on 03/15/2019 09:47:22 [name] ([id number]) -------------------------------------------------------------------------------- Progress Note Details Patient Name: [name] Date of Service: 03/15/2019 9:30 AM Medical Record Number: [id number] Patient Account Number: [id number] Date of  Birth/Sex: Jul 27, 1965 (54 y.o. M) Treating RN: Primary Care Provider: Megan Mans Other Clinician: Referring Provider: Megan Mans Treating Provider/Extender: Skeet Simmer in Treatment: 2 Subjective Chief Complaint Information obtained from Patient Left LE Ulcer History of Present Illness (HPI) 03/01/2019 upon evaluation today patient presents for initial inspection here in our clinic concerning issues he has been having for the past 2 months with a wound over the left lower leg anteriorly. He states that  this just came up he really did not have any injury he is not sure what exactly caused that other than his legs are always "very tight". He has been referred by Dr. Cristal Deer End for arterial studies and actually has those later today. He also has a history of chronic venous insufficiency, hypertension, and coronary artery disease. Subsequently he is also gotten compression stockings in the past 2 weeks although obviously somewhat hard to put that on on the left side being that again he has significant issues with swelling on top of the fact has been having put a dressing over this area as well that makes even that much more difficult. Nonetheless he does need good compression wrap possibly even a 4-layer compression wrap to get some of the edema down but we need to make sure his arterial flow is good prior to that. Fortunately there is no evidence of active infection. No fevers, chills, nausea, vomiting, or diarrhea. 03/08/2019 upon evaluation today patient appears to be doing excellent with regard to his lower extremity ulcers. In fact when we were managing last week seems to be doing much better and in fact is dramatically improved even compared to previous. With that being said his current issue is that he has a couple of the blisters that have reopened and that is more recent. With that being said I do believe that we can continue to manage this and I believe now  that we know he has sufficient arterial flow we can even increase the compression to a 3 layer compression wrap which should be of benefit. 03/15/2019 upon evaluation today patient appears to be doing excellent with regard to his wound. In fact the last remaining area is almost completely healed this is just a small blister that came up last week and again has almost closed back over. The original sore is completely healed and in general I think he has done excellent. I see no signs of active infection at this point. The patient is very pleased with how things seem to be progressing. Objective Constitutional Well-nourished and well-hydrated in no acute distress. Vitals Time Taken: 9:44 AM, Height: 67 in, Weight: 280 lbs, BMI: 43.8, Temperature: 98.2 F, Pulse: 87 bpm, Respiratory Rate: 16 breaths/min, Blood Pressure: 141/70 mmHg. Respiratory normal breathing without difficulty. SHAWNTE, DEMAREST ([id number]) Psychiatric this patient is able to make decisions and demonstrates good insight into disease process. Alert and Oriented x 3. pleasant and cooperative. General Notes: Upon inspection patient's wound bed again showed signs of almost complete resolution he just has 1 small area that had blistered last week remaining open and even this is barely visible. I think that given 1 more week of wrapping this will likely completely resolve. Integumentary (Hair, Skin) Wound #1 status is Healed - Epithelialized. Original cause of wound was Blister. The wound is located on the Left Lower Leg. The wound measures 0cm length x 0cm width x 0cm depth; 0cm^2 area and 0cm^3 volume. The wound is limited to skin breakdown. There is no tunneling or undermining noted. There is a none present amount of drainage noted. The wound margin is flat and intact. There is no granulation within the wound bed. There is no necrotic tissue within the wound bed. Wound #2 status is Open. Original cause of wound was Gradually  Appeared. The wound is located on the Left,Proximal,Anterior Lower Leg. The wound measures 0.4cm length x 0.3cm width x 0.1cm depth; 0.094cm^2 area and 0.009cm^3 volume. There is Fat Layer (Subcutaneous Tissue) Exposed  exposed. There is no tunneling or undermining noted. There is a medium amount of serous drainage noted. The wound margin is flat and intact. There is large (67-100%) pink granulation within the wound bed. There is no necrotic tissue within the wound bed. Assessment Active Problems ICD-10 Venous insufficiency (chronic) (peripheral) Non-pressure chronic ulcer of other part of left lower leg with fat layer exposed Essential (primary) hypertension Atherosclerotic heart disease of native coronary artery without angina pectoris Procedures Wound #2 Pre-procedure diagnosis of Wound #2 is a Venous Leg Ulcer located on the Left,Proximal,Anterior Lower Leg . There was a Three Layer Compression Therapy Procedure with a pre-treatment ABI of 1 by Huel Coventry, RN. Post procedure Diagnosis Wound #2: Same as Pre-Procedure Plan Wound Cleansing: Wound #2 Left,Proximal,Anterior Lower Leg: Clean wound with Normal Saline. Cleanse wound with mild soap and water ADELBERT, GASPARD ([id number]) May Shower, gently pat wound dry prior to applying new dressing. Dressing Change Frequency: Wound #2 Left,Proximal,Anterior Lower Leg: Change dressing every week Follow-up Appointments: Wound #2 Left,Proximal,Anterior Lower Leg: Return Appointment in 1 week. Edema Control: Wound #2 Left,Proximal,Anterior Lower Leg: 3 Layer Compression System - Left Lower Extremity Elevate legs to the level of the heart and pump ankles as often as possible 1. I recommend currently that we continue with the compression wrap. We have been utilizing a 3 layer compression wrap at this point on the right lower extremity we will continue as such. Left lower extremity and we will continue as such. 2. I am also going to suggest  that we continue with the elevation which has done excellent for him up to this point I think he can be active but when he is sedentary I think elevating is good. 3. We will not apply any specific dressing will just use a contact layer to prevent this from sticking to the wound bed and the compression wrap. We will see patient back for reevaluation in 1 week here in the clinic. If anything worsens or changes patient will contact our office for additional recommendations. Electronic Signature(s) Signed: 03/15/2019 5:33:32 PM By: Lenda Kelp PA-C Entered By: Lenda Kelp on 03/15/2019 17:33:31 [name] ([id number]) -------------------------------------------------------------------------------- SuperBill Details Patient Name: [name] Date of Service: 03/15/2019 Medical Record Number: [id number] Patient Account Number: [id number] Date of Birth/Sex: 12/17/1965 (54 y.o. M) Treating RN: Huel Coventry Primary Care Provider: Wendelyn Breslow, Gerlene Burdock Other Clinician: Referring Provider: Wendelyn Breslow, Gerlene Burdock Treating Provider/Extender: Linwood Dibbles, Laythan Hayter Weeks in Treatment: 2 Diagnosis Coding ICD-10 Codes Code Description I87.2 Venous insufficiency (chronic) (peripheral) L97.822 Non-pressure chronic ulcer of other part of left lower leg with fat layer exposed I10 Essential (primary) hypertension I25.10 Atherosclerotic heart disease of native coronary artery without angina pectoris Facility Procedures CPT4 Code Description: [id number] (Facility Use Only) [phone] - APPLY MULTLAY COMPRS LWR RT LEG Modifier: Quantity: 1 Physician Procedures CPT4 Code Description: [id number] - WC PHYS LEVEL 3 - EST PT ICD-10 Diagnosis Description I87.2 Venous insufficiency (chronic) (peripheral) L97.822 Non-pressure chronic ulcer of other part of left lower leg wit I10 Essential (primary) hypertension  I25.10 Atherosclerotic heart disease of native coronary artery withou Modifier: h fat layer expos t  angina pectoris Quantity: 1 ed Electronic Signature(s) Signed: 03/15/2019 5:33:41 PM By: Lenda Kelp PA-C Entered By: Lenda Kelp on 03/15/2019 17:33:41

## 2019-03-16 NOTE — Progress Notes (Signed)
Follow-up Outpatient Visit Date: 03/17/2019  Primary Care Provider: Maple Hudson., MD [address] [place] [address]  Chief Complaint: Follow-up leg swelling  HPI:  Jon Garner is a 54 y.o. 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  I last saw Jon Garner on 02/24/19, at which time he was most concerned about leg swelling and wounds that had developed along the left calf.  He was noted to have 2+ pitting edema in both legs, prompting [place] to start furosemide 40 mg daily.  ABI's did not show evidence of significant PAD.  Jon Garner was referred to the wound clinic, where he was last seen earlier this week.  Today, Jon Garner reports that his leg swelling has improved.  Though weight on our scale is up 3 pounds, he attributes this to heavy clothing that he is wearing today.  He actually thinks that he is lost about 5 pounds since our last visit in late January.  He has minimal leg edema, helped significantly by wraps applied at wound care clinic.  Left calf wounds have essentially resolved at this point.  He denies chest pain, shortness of breath, palpitations, lightheadedness, and orthopnea.  He is scheduled for sleep study early next month.  --------------------------------------------------------------------------------------------------  Past Medical History:  Diagnosis Date  . CAD (coronary artery disease)    a. LHC 4/19: pLAD 95%, mLAD 95%, m/dLAD 90%, ostD2 95%, pLCx 80%, mRCA 70%, dRCA 80%; b. 4-V CABG 05/2017: LIMA-LAD, VG-D1, VG-PDA, VG-OM2  . Essential hypertension 2014  . GERD (gastroesophageal reflux disease) 2015  . Morbid obesity (HCC) presently  . PXE (pseudoxanthoma  elasticum) 1992  . Snores presently  . Stroke [facility]) 1992   a. Age 29   Past Surgical History:  Procedure Laterality Date  . CORONARY ARTERY BYPASS GRAFT N/A 05/29/2017   Procedure: CORONARY ARTERY BYPASS GRAFTING (CABG)  X 4 WITH OPEN HARVESTING OF LEFT AND RIGHT SAPHENOUS VEINS;  Surgeon: Loreli Slot, MD;  Location: [facility] OR;  Service: Open Heart Surgery;  Laterality: N/A;  . KNEE SURGERY Bilateral   . LEFT HEART CATH AND CORONARY ANGIOGRAPHY N/A 05/26/2017   Procedure: LEFT HEART CATH AND CORONARY ANGIOGRAPHY;  Surgeon: Antonieta Iba, MD;  Location: [facility] INVASIVE CV LAB;  Service: Cardiovascular;  Laterality: N/A;  . TEE WITHOUT CARDIOVERSION N/A 05/29/2017   Procedure: TRANSESOPHAGEAL ECHOCARDIOGRAM (TEE);  Surgeon: Loreli Slot, MD;  Location: [facility] OR;  Service: Open Heart Surgery;  Laterality: N/A;    Current Meds  Medication Sig  . aspirin EC 81 MG tablet Take 81 mg by mouth once.  Marland Kitchen atorvastatin (LIPITOR) 80 MG tablet Take 1 tablet (80 mg total) by mouth daily at 6 PM.  . furosemide (LASIX) 40 MG tablet Take 1 tablet (40 mg total) by mouth daily.  Marland Kitchen lisinopril (ZESTRIL) 40 MG tablet Take 1 tablet (40 mg total) by mouth daily.  . metoprolol tartrate (LOPRESSOR) 25 MG tablet TAKE 1 TABLET(25 MG) BY MOUTH TWICE DAILY  . nitroGLYCERIN (NITROSTAT) 0.4 MG SL tablet Place 1 tablet (0.4 mg total) under the tongue every 5 (five) minutes as needed for chest pain. Maximum of 3 doses.  Marland Kitchen omeprazole (PRILOSEC) 20 MG capsule TAKE 1 CAPSULE DAILY (DUE FOR OFFICE VISIT BEFORE  NEXT REFILL)  . potassium chloride (K-DUR) 10 MEQ tablet Take 1 tablet (10 mEq total) by mouth daily.  . vitamin C (ASCORBIC ACID) 500 MG tablet Take 500 mg by mouth daily as needed.  . [DISCONTINUED] atorvastatin (LIPITOR) 80 MG tablet Take 1 tablet (80 mg total) by mouth daily.    Allergies: Penicillins  Social History   Tobacco Use  . Smoking status: Former Smoker    Packs/day: 1.50    Years: 10.00      Pack years: 15.00    Types: Cigarettes    Quit date: 12/05/2013    Years since quitting: 5.2  . Smokeless tobacco: Never Used  Substance Use Topics  . Alcohol use: Not Currently  . Drug use: Never    Family History  Problem Relation Age of Onset  . Dementia Mother   . Other Father        died in his 75's  . COPD Father   . Multiple sclerosis Sister     Review of Systems: A 12-system review of systems was performed and was negative except as noted in the HPI.  --------------------------------------------------------------------------------------------------  Physical Exam: BP 116/72 (BP Location: Left Arm, Patient Position: Sitting, Cuff Size: Large)   Pulse 68   Ht 5\' 7"  (1.702 m)   Wt 291 lb (132 kg)   BMI 45.58 kg/m   General: NAD. HEENT: No conjunctival pallor or scleral icterus. Facemask in place. Neck: No JVD or HJR, though body habitus limits evaluation. Lungs: Normal work of breathing. Clear to auscultation bilaterally without wheezes or crackles. Heart: Distant heart sounds.  Regular rate and rhythm without murmurs, rubs, or gallops. Abd: Obese but soft and nontender. Ext: Left calf is wrapped.  There is trace right pretibial edema.    EKG: Normal sinus rhythm without significant abnormality.  Lab Results  Component Value Date   WBC 8.4 02/24/2019   HGB 14.5 02/24/2019   HCT 43.0 02/24/2019   MCV 88 02/24/2019   PLT 271 02/24/2019    Lab Results  Component Value Date   NA 140 02/24/2019   K 4.4 02/24/2019   CL 105 02/24/2019   CO2 20 02/24/2019   BUN 18 02/24/2019   CREATININE 0.98 02/24/2019   GLUCOSE 109 (H) 02/24/2019   ALT 17 02/24/2019    Lab Results  Component Value Date   CHOL 103 02/24/2019   HDL 33 (L) 02/24/2019   LDLCALC 54 02/24/2019   TRIG 81 02/24/2019   CHOLHDL 3.1 02/24/2019    --------------------------------------------------------------------------------------------------  ASSESSMENT AND PLAN: Bilateral lower  extremity edema and left calf wounds: Swelling has improved significantly since our last visit, following addition of furosemide and referral to the wound care clinic.  Jon Garner also reports that his left calf wounds have essentially healed; they were not personally examined today as his calf is wrapped.  We will continue current dose of furosemide with repeat BMP today to ensure stable renal function and potassium.  I encouraged Jon Garner to continue to elevate his legs when possible and follow-up with the wound care clinic.  Chronic HFpEF: Volume status seems to be improving, with leg swelling likely due to a combination of diastolic dysfunction and venous insufficiency.  Recent echo showed preserved LVEF with grade 2 diastolic dysfunction.  Continue current diuretic regimen.  We will check a BMP today to ensure stable renal function and potassium.  Coronary artery disease: No symptoms to suggest worsening coronary insufficiency.  Continue current medications for secondary prevention, including aspirin  81 mg daily, atorvastatin 80 mg daily, and metoprolol tartrate 25 mg twice daily.  Hyperlipidemia: LDL at goal on last check in 02/2019.  Continue atorvastatin 80 mg daily.  Essential hypertension: Blood pressure well controlled.  Continue current doses of metoprolol and lisinopril.  Morbid obesity: BMI > 40 with multiple comorbidities.  Weight loss encouraged through combination of diet and exercise.  Follow-up: Return to clinic in 3 months.  Nelva Bush, MD 03/17/2019 12:58 PM

## 2019-03-16 NOTE — Progress Notes (Signed)
KOHNER, ORLICK ([id number]) Visit Report for 03/15/2019 Arrival Information Details Patient Name: Jon Garner, Jon Garner. Date of Service: 03/15/2019 9:30 AM Medical Record Number: [id number] Patient Account Number: [id number] Date of Birth/Sex: 04-May-1965 (54 y.o. M) Treating RN: Curtis Sites Primary Care Tatyanna Cronk: Wendelyn Breslow, RICHARD Other Clinician: Referring Anabia Weatherwax: Megan Mans Treating Sharice Harriss/Extender: Linwood Dibbles, HOYT Weeks in Treatment: 2 Visit Information History Since Last Visit Added or deleted any medications: No Patient Arrived: Ambulatory Any new allergies or adverse reactions: No Arrival Time: 09:42 Had a fall or experienced change in No Accompanied By: self activities of daily living that may affect Transfer Assistance: None risk of falls: Patient Identification Verified: Yes Signs or symptoms of abuse/neglect since last visito No Secondary Verification Process Completed: Yes Hospitalized since last visit: No Implantable device outside of the clinic excluding No cellular tissue based products placed in the center since last visit: Has Dressing in Place as Prescribed: Yes Has Compression in Place as Prescribed: Yes Pain Present Now: No Electronic Signature(s) Signed: 03/15/2019 4:23:54 PM By: Curtis Sites Entered By: Curtis Sites on 03/15/2019 09:43:04 Jon Garner ([id number]) -------------------------------------------------------------------------------- Compression Therapy Details Patient Name: Jon Garner. Date of Service: 03/15/2019 9:30 AM Medical Record Number: [id number] Patient Account Number: [id number] Date of Birth/Sex: Aug 16, 1965 (54 y.o. M) Treating RN: Huel Coventry Primary Care Evangelos Paulino: Wendelyn Breslow, Gerlene Burdock Other Clinician: Referring Mosie Angus: Megan Mans Treating Janey Petron/Extender: Linwood Dibbles, HOYT Weeks in Treatment: 2 Compression Therapy Performed for Wound Assessment: Wound #2 Left,Proximal,Anterior Lower Leg Performed  By: Clinician Huel Coventry, RN Compression Type: Three Layer Pre Treatment ABI: 1 Post Procedure Diagnosis Same as Pre-procedure Electronic Signature(s) Signed: 03/16/2019 4:33:39 PM By: Elliot Gurney, BSN, RN, CWS, Kim RN, BSN Entered By: Elliot Gurney, BSN, RN, CWS, Kim on 03/15/2019 10:11:13 Jon Garner ([id number]) -------------------------------------------------------------------------------- Encounter Discharge Information Details Patient Name: Jon Garner. Date of Service: 03/15/2019 9:30 AM Medical Record Number: [id number] Patient Account Number: [id number] Date of Birth/Sex: 14-Dec-1965 (54 y.o. M) Treating RN: Huel Coventry Primary Care Boots Mcglown: Wendelyn Breslow, Gerlene Burdock Other Clinician: Referring Lisa-Marie Rueger: Megan Mans Treating Juliette Standre/Extender: Skeet Simmer in Treatment: 2 Encounter Discharge Information Items Discharge Condition: Stable Ambulatory Status: Ambulatory Discharge Destination: Home Transportation: Private Auto Accompanied By: self Schedule Follow-up Appointment: Yes Clinical Summary of Care: Electronic Signature(s) Signed: 03/16/2019 4:33:39 PM By: Elliot Gurney, BSN, RN, CWS, Kim RN, BSN Entered By: Elliot Gurney, BSN, RN, CWS, Kim on 03/15/2019 10:15:04 Jon Garner ([id number]) -------------------------------------------------------------------------------- Lower Extremity Assessment Details Patient Name: Jon Garner. Date of Service: 03/15/2019 9:30 AM Medical Record Number: [id number] Patient Account Number: [id number] Date of Birth/Sex: May 03, 1965 (54 y.o. M) Treating RN: Curtis Sites Primary Care Ashleah Valtierra: Wendelyn Breslow, Gerlene Burdock Other Clinician: Referring Judee Hennick: Wendelyn Breslow, RICHARD Treating Siomara Burkel/Extender: Linwood Dibbles, HOYT Weeks in Treatment: 2 Edema Assessment Assessed: [Left: No] [Right: No] Edema: [Left: Ye] [Right: s] Calf Left: Right: Point of Measurement: 32 cm From Medial Instep 42 cm cm Ankle Left: Right: Point of Measurement: 13 cm From  Medial Instep 24 cm cm Vascular Assessment Pulses: Dorsalis Pedis Palpable: [Left:Yes] Electronic Signature(s) Signed: 03/15/2019 4:23:54 PM By: Curtis Sites Entered By: Curtis Sites on 03/15/2019 09:50:22 Jon Garner ([id number]) -------------------------------------------------------------------------------- Multi Wound Chart Details Patient Name: Jon Garner. Date of Service: 03/15/2019 9:30 AM Medical Record Number: [id number] Patient Account Number: [id number] Date of Birth/Sex: [birth year] (54 y.o. M) Treating RN: Huel Coventry Primary Care Jalexia Lalli: Wendelyn Breslow, Gerlene Burdock Other Clinician: Referring Hamsini Verrilli: Wendelyn Breslow, Gerlene Burdock Treating Mariano Doshi/Extender: Linwood Dibbles, HOYT Weeks in  Treatment: 2 Vital Signs Height(in): 67 Pulse(bpm): 87 Weight(lbs): 280 Blood Pressure(mmHg): 141/70 Body Mass Index(BMI): 44 Temperature(F): 98.2 Respiratory Rate 16 (breaths/min): Photos: [N/A:N/A] Wound Location: Left Lower Leg Left, Proximal, Anterior Lower N/A Leg Wounding Event: Blister Gradually Appeared N/A Primary Etiology: Venous Leg Ulcer Venous Leg Ulcer N/A Comorbid History: Coronary Artery Disease, Coronary Artery Disease, N/A Hypertension, Myocardial Hypertension, Myocardial Infarction Infarction Date Acquired: 12/30/2018 03/08/2019 N/A Weeks of Treatment: 2 1 N/A Wound Status: Healed - Epithelialized Open N/A Clustered Wound: No Yes N/A Clustered Quantity: N/A 2 N/A Measurements L x W x D 0x0x0 0.4x0.3x0.1 N/A (cm) Area (cm) : 0 0.094 N/A Volume (cm) : 0 0.009 N/A % Reduction in Area: 100.00% 97.50% N/A % Reduction in Volume: 100.00% 97.60% N/A Classification: Full Thickness Without Full Thickness Without N/A Exposed Support Structures Exposed Support Structures Exudate Amount: None Present Medium N/A Exudate Type: N/A Serous N/A Exudate Color: N/A amber N/A Wound Margin: Flat and Intact Flat and Intact N/A Granulation Amount: None Present (0%) Large (67-100%)  N/A Granulation Quality: N/A Pink N/A Necrotic Amount: None Present (0%) None Present (0%) N/A Exposed Structures: Fascia: No Fat Layer (Subcutaneous N/A Fat Layer (Subcutaneous Tissue) Exposed: Yes [name] ([id number]) Tissue) Exposed: No Fascia: No Tendon: No Tendon: No Muscle: No Muscle: No Joint: No Joint: No Bone: No Bone: No Limited to Skin Breakdown Epithelialization: Large (67-100%) Medium (34-66%) N/A Treatment Notes Electronic Signature(s) Signed: 03/16/2019 4:33:39 PM By: Gretta Cool, BSN, RN, CWS, Kim RN, BSN Entered By: Gretta Cool, BSN, RN, CWS, Kim on 03/15/2019 10:10:52 [name] ([id number]) -------------------------------------------------------------------------------- Multi-Disciplinary Care Plan Details Patient Name: MOMEN, HAM. Date of Service: 03/15/2019 9:30 AM Medical Record Number: [id number] Patient Account Number: [id number] Date of Birth/Sex: [birth year] (54 y.o. M) Treating RN: Cornell Barman Primary Care Mindy Gali: Cranford Mon, Delfino Lovett Other Clinician: Referring Chi Woodham: Wilhemena Durie Treating Nicoli Nardozzi/Extender: Melburn Hake, HOYT Weeks in Treatment: 2 Active Inactive Orientation to the Wound Care Program Nursing Diagnoses: Knowledge deficit related to the wound healing center program Goals: Patient/caregiver will verbalize understanding of the [facility] Date Initiated: 03/01/2019 Target Resolution Date: 03/12/2019 Goal Status: Active Interventions: Provide education on orientation to the wound center Notes: Venous Leg Ulcer Nursing Diagnoses: Potential for venous Insuffiency (use before diagnosis confirmed) Goals: Patient will maintain optimal edema control Date Initiated: 03/01/2019 Target Resolution Date: 03/12/2019 Goal Status: Active Interventions: Assess peripheral edema status every visit. Notes: Wound/Skin Impairment Nursing Diagnoses: Impaired tissue integrity Goals: Ulcer/skin breakdown will have a  volume reduction of 30% by week 4 Date Initiated: 03/01/2019 Target Resolution Date: 03/19/2019 Goal Status: Active Interventions: Assess ulceration(s) every visit BASCOM, BIEL ([id number]) Notes: Electronic Signature(s) Signed: 03/16/2019 4:33:39 PM By: Gretta Cool, BSN, RN, CWS, Kim RN, BSN Entered By: Gretta Cool, BSN, RN, CWS, Kim on 03/15/2019 10:10:22 [name] ([id number]) -------------------------------------------------------------------------------- Pain Assessment Details Patient Name: [name]. Date of Service: 03/15/2019 9:30 AM Medical Record Number: [id number] Patient Account Number: [id number] Date of Birth/Sex: 1965/05/13 (53 y.o. M) Treating RN: Montey Hora Primary Care Winton Offord: Cranford Mon, Delfino Lovett Other Clinician: Referring Hinata Diener: Wilhemena Durie Treating Lakea Mittelman/Extender: Melburn Hake, HOYT Weeks in Treatment: 2 Active Problems Location of Pain Severity and Description of Pain Patient Has Paino No Site Locations Pain Management and Medication Current Pain Management: Electronic Signature(s) Signed: 03/15/2019 4:23:54 PM By: Montey Hora Entered By: Montey Hora on 03/15/2019 09:43:10 [name] ([id number]) -------------------------------------------------------------------------------- Patient/Caregiver Education Details Patient Name: [name]. Date of Service: 03/15/2019 9:30 AM Medical  Record Number: [id number] Patient Account Number: [id number] Date of Birth/Gender: Nov 01, 1965 (54 y.o. M) Treating RN: Huel Coventry Primary Care Physician: Wendelyn Breslow, Gerlene Burdock Other Clinician: Referring Physician: Megan Mans Treating Physician/Extender: Skeet Simmer in Treatment: 2 Education Assessment Education Provided To: Patient Education Topics Provided Venous: Handouts: Controlling Swelling with Multilayered Compression Wraps Methods: Demonstration, Explain/Verbal Responses: State content correctly Welcome To The  Wound Care Center: Wound/Skin Impairment: Handouts: Caring for Your Ulcer Methods: Demonstration, Explain/Verbal Responses: State content correctly Electronic Signature(s) Signed: 03/16/2019 4:33:39 PM By: Elliot Gurney, BSN, RN, CWS, Kim RN, BSN Entered By: Elliot Gurney, BSN, RN, CWS, Kim on 03/15/2019 10:14:25 Jon Garner ([id number]) -------------------------------------------------------------------------------- Wound Assessment Details Patient Name: Jon Garner. Date of Service: 03/15/2019 9:30 AM Medical Record Number: [id number] Patient Account Number: [id number] Date of Birth/Sex: 15-Mar-1965 (54 y.o. M) Treating RN: Huel Coventry Primary Care Lary Eckardt: Wendelyn Breslow, Gerlene Burdock Other Clinician: Referring Paublo Warshawsky: Wendelyn Breslow, Gerlene Burdock Treating Rocquel Askren/Extender: Linwood Dibbles, HOYT Weeks in Treatment: 2 Wound Status Wound Number: 1 Primary Venous Leg Ulcer Etiology: Wound Location: Left Lower Leg Wound Status: Healed - Epithelialized Wounding Event: Blister Comorbid Coronary Artery Disease, Hypertension, Date Acquired: 12/30/2018 History: Myocardial Infarction Weeks Of Treatment: 2 Clustered Wound: No Photos Wound Measurements Length: (cm) 0 % Reducti Width: (cm) 0 % Reducti Depth: (cm) 0 Epithelia Area: (cm) 0 Tunnelin Volume: (cm) 0 Undermin on in Area: 100% on in Volume: 100% lization: Large (67-100%) g: No ing: No Wound Description Full Thickness Without Exposed Support Foul Odor Classification: Structures Slough/Fi Wound Margin: Flat and Intact Exudate None Present Amount: After Cleansing: No brino No Wound Bed Granulation Amount: None Present (0%) Exposed Structure Necrotic Amount: None Present (0%) Fascia Exposed: No Fat Layer (Subcutaneous Tissue) Exposed: No Tendon Exposed: No Muscle Exposed: No Joint Exposed: No Bone Exposed: No Limited to Skin Breakdown Electronic Signature(s) CARLIN, ATTRIDGE ([id number]) Signed: 03/16/2019 4:33:39 PM By: Elliot Gurney, BSN, RN,  CWS, Kim RN, BSN Entered By: Elliot Gurney, BSN, RN, CWS, Kim on 03/15/2019 10:08:59 Jon Garner ([id number]) -------------------------------------------------------------------------------- Wound Assessment Details Patient Name: Jon Garner. Date of Service: 03/15/2019 9:30 AM Medical Record Number: [id number] Patient Account Number: [id number] Date of Birth/Sex: Aug 03, 1965 (54 y.o. M) Treating RN: Huel Coventry Primary Care Roxy Filler: Wendelyn Breslow, Gerlene Burdock Other Clinician: Referring Vishal Sandlin: Wendelyn Breslow, Gerlene Burdock Treating Michaelia Beilfuss/Extender: Linwood Dibbles, HOYT Weeks in Treatment: 2 Wound Status Wound Number: 2 Primary Venous Leg Ulcer Etiology: Wound Location: Left, Proximal, Anterior Lower Leg Wound Status: Open Wounding Event: Gradually Appeared Comorbid Coronary Artery Disease, Hypertension, Date Acquired: 03/08/2019 History: Myocardial Infarction Weeks Of Treatment: 1 Clustered Wound: Yes Photos Wound Measurements Length: (cm) 0.4 % Reduc Width: (cm) 0.3 % Reduc Depth: (cm) 0.1 Epithel Clustered Quantity: 2 Tunneli Area: (cm) 0.094 Underm Volume: (cm) 0.009 tion in Area: 97.5% tion in Volume: 97.6% ialization: Medium (34-66%) ng: No ining: No Wound Description Full Thickness Without Exposed Support Foul Od Classification: Structures Slough/ Wound Margin: Flat and Intact Exudate Medium Amount: Exudate Type: Serous Exudate Color: amber or After Cleansing: No Fibrino No Wound Bed Granulation Amount: Large (67-100%) Exposed Structure Granulation Quality: Pink Fascia Exposed: No Necrotic Amount: None Present (0%) Fat Layer (Subcutaneous Tissue) Exposed: Yes Tendon Exposed: No Muscle Exposed: No Joint Exposed: No Bone Exposed: No Jon Garner ([id number]) Treatment Notes Wound #2 (Left, Proximal, Anterior Lower Leg) Notes prisma, ADB, 3LL Electronic Signature(s) Signed: 03/16/2019 4:33:39 PM By: Elliot Gurney, BSN, RN, CWS, Kim RN, BSN Entered By: Elliot Gurney, BSN, RN, CWS,  Kim on 03/15/2019 10:09:08  DIMITRIUS, STEEDMAN ([id number]) -------------------------------------------------------------------------------- Vitals Details Patient Name: COLIE, FUGITT. Date of Service: 03/15/2019 9:30 AM Medical Record Number: [id number] Patient Account Number: [id number] Date of Birth/Sex: 08-31-1965 (54 y.o. M) Treating RN: Curtis Sites Primary Care Airyanna Dipalma: Wendelyn Breslow, RICHARD Other Clinician: Referring Lazaria Schaben: Megan Mans Treating Viviene Thurston/Extender: Linwood Dibbles, HOYT Weeks in Treatment: 2 Vital Signs Time Taken: 09:44 Temperature (F): 98.2 Height (in): 67 Pulse (bpm): 87 Weight (lbs): 280 Respiratory Rate (breaths/min): 16 Body Mass Index (BMI): 43.8 Blood Pressure (mmHg): 141/70 Reference Range: 80 - 120 mg / dl Electronic Signature(s) Signed: 03/15/2019 4:23:54 PM By: Curtis Sites Entered By: Curtis Sites on 03/15/2019 09:44:47

## 2019-03-17 ENCOUNTER — Encounter: Payer: Self-pay | Admitting: Internal Medicine

## 2019-03-17 ENCOUNTER — Other Ambulatory Visit: Payer: Self-pay

## 2019-03-17 ENCOUNTER — Ambulatory Visit (INDEPENDENT_AMBULATORY_CARE_PROVIDER_SITE_OTHER): Payer: Managed Care, Other (non HMO) | Admitting: Internal Medicine

## 2019-03-17 VITALS — BP 116/72 | HR 68 | Ht 67.0 in | Wt 291.0 lb

## 2019-03-17 DIAGNOSIS — I2510 Atherosclerotic heart disease of native coronary artery without angina pectoris: Secondary | ICD-10-CM | POA: Diagnosis not present

## 2019-03-17 DIAGNOSIS — I5032 Chronic diastolic (congestive) heart failure: Secondary | ICD-10-CM | POA: Diagnosis not present

## 2019-03-17 DIAGNOSIS — E785 Hyperlipidemia, unspecified: Secondary | ICD-10-CM

## 2019-03-17 DIAGNOSIS — M7989 Other specified soft tissue disorders: Secondary | ICD-10-CM | POA: Diagnosis not present

## 2019-03-17 DIAGNOSIS — I10 Essential (primary) hypertension: Secondary | ICD-10-CM | POA: Diagnosis not present

## 2019-03-17 MED ORDER — ATORVASTATIN CALCIUM 80 MG PO TABS: 80.0000 mg | ORAL_TABLET | Freq: Every day | ORAL | 1 refills | Status: DC

## 2019-03-17 NOTE — Patient Instructions (Signed)
Medication Instructions:  Your physician recommends that you continue on your current medications as directed. Please refer to the Current Medication list given to you today.  *If you need a refill on your cardiac medications before your next appointment, please call your pharmacy*  Lab Work: Your physician recommends that you return for lab work in: TODAY - BMP.  If you have labs (blood work) drawn today and your tests are completely normal, you will receive your results only by: Marland Kitchen MyChart Message (if you have MyChart) OR . A paper copy in the mail If you have any lab test that is abnormal or we need to change your treatment, we will call you to review the results.  Testing/Procedures: none  Follow-Up: At [facility], you and your health needs are our priority.  As part of our continuing mission to provide you with exceptional heart care, we have created designated Provider Care Teams.  These Care Teams include your primary Cardiologist (physician) and Advanced Practice Providers (APPs -  Physician Assistants and Nurse Practitioners) who all work together to provide you with the care you need, when you need it.  Your next appointment:   3 month(s)  The format for your next appointment:   In Person  Provider:    You may see Julien Nordmann, MD or one of the following Advanced Practice Providers on your designated Care Team:    Nicolasa Ducking, NP  Eula Listen, PA-C  Marisue Ivan, PA-C

## 2019-03-18 LAB — BASIC METABOLIC PANEL
BUN/Creatinine Ratio: 16 (ref 9–20)
BUN: 16 mg/dL (ref 6–24)
CO2: 18 mmol/L — ABNORMAL LOW (ref 20–29)
Calcium: 8.8 mg/dL (ref 8.7–10.2)
Chloride: 104 mmol/L (ref 96–106)
Creatinine, Ser: 1.03 mg/dL (ref 0.76–1.27)
GFR calc Af Amer: 95 mL/min/{1.73_m2} (ref >59)
GFR calc non Af Amer: 82 mL/min/{1.73_m2} (ref >59)
Glucose: 96 mg/dL (ref 65–99)
Potassium: 4.9 mmol/L (ref 3.5–5.2)
Sodium: 138 mmol/L (ref 134–144)

## 2019-03-22 ENCOUNTER — Other Ambulatory Visit: Payer: Self-pay

## 2019-03-22 ENCOUNTER — Encounter: Payer: Managed Care, Other (non HMO) | Admitting: Physician Assistant

## 2019-03-22 DIAGNOSIS — L97822 Non-pressure chronic ulcer of other part of left lower leg with fat layer exposed: Secondary | ICD-10-CM | POA: Diagnosis not present

## 2019-03-22 NOTE — Progress Notes (Signed)
SUPREME, RYBARCZYK ([id number]) Visit Report for 03/22/2019 Arrival Information Details Patient Name: Jon Garner, Jon Garner. Date of Service: 03/22/2019 9:15 AM Medical Record Number: [id number] Patient Account Number: [id number] Date of Birth/Sex: [birth year] (54 y.o. M) Treating RN: Curtis Sites Primary Care Jarrette Dehner: Wendelyn Breslow, RICHARD Other Clinician: Referring Monzerrath Mcburney: Megan Mans Treating Clearance Chenault/Extender: Linwood Dibbles, HOYT Weeks in Treatment: 3 Visit Information History Since Last Visit Added or deleted any medications: No Patient Arrived: Ambulatory Any new allergies or adverse reactions: No Arrival Time: 09:18 Had a fall or experienced change in No Accompanied By: self activities of daily living that may affect Transfer Assistance: None risk of falls: Patient Identification Verified: Yes Signs or symptoms of abuse/neglect since last visito No Secondary Verification Process Completed: Yes Hospitalized since last visit: No Implantable device outside of the clinic excluding No cellular tissue based products placed in the center since last visit: Has Dressing in Place as Prescribed: Yes Has Compression in Place as Prescribed: Yes Pain Present Now: No Electronic Signature(s) Signed: 03/22/2019 4:29:13 PM By: Curtis Sites Entered By: Curtis Sites on 03/22/2019 09:18:32 Jon Garner ([id number]) -------------------------------------------------------------------------------- Clinic Level of Care Assessment Details Patient Name: Jon Garner. Date of Service: 03/22/2019 9:15 AM Medical Record Number: [id number] Patient Account Number: [id number] Date of Birth/Sex: July 18, 1965 (54 y.o. M) Treating RN: Huel Coventry Primary Care Pihu Basil: Wendelyn Breslow, Gerlene Burdock Other Clinician: Referring Hommer Cunliffe: Wendelyn Breslow, Gerlene Burdock Treating Garnetta Fedrick/Extender: Linwood Dibbles, HOYT Weeks in Treatment: 3 Clinic Level of Care Assessment Items TOOL 4 Quantity Score []  - Use when only an  EandM is performed on FOLLOW-UP visit 0 ASSESSMENTS - Nursing Assessment / Reassessment []  - Reassessment of Co-morbidities (includes updates in patient status) 0 X- 1 5 Reassessment of Adherence to Treatment Plan ASSESSMENTS - Wound and Skin Assessment / Reassessment X - Simple Wound Assessment / Reassessment - one wound 1 5 []  - 0 Complex Wound Assessment / Reassessment - multiple wounds []  - 0 Dermatologic / Skin Assessment (not related to wound area) ASSESSMENTS - Focused Assessment []  - Circumferential Edema Measurements - multi extremities 0 []  - 0 Nutritional Assessment / Counseling / Intervention []  - 0 Lower Extremity Assessment (monofilament, tuning fork, pulses) []  - 0 Peripheral Arterial Disease Assessment (using hand held doppler) ASSESSMENTS - Ostomy and/or Continence Assessment and Care []  - Incontinence Assessment and Management 0 []  - 0 Ostomy Care Assessment and Management (repouching, etc.) PROCESS - Coordination of Care X - Simple Patient / Family Education for ongoing care 1 15 []  - 0 Complex (extensive) Patient / Family Education for ongoing care []  - 0 Staff obtains , Records, Test Results / Process Orders []  - 0 Staff telephones HHA, Nursing Homes / Clarify orders / etc []  - 0 Routine Transfer to another Facility (non-emergent condition) []  - 0 Routine Hospital Admission (non-emergent condition) []  - 0 New Admissions / / Ordering NPWT, Apligraf, etc. []  - 0 Emergency Hospital Admission (emergent condition) X- 1 10 Simple Discharge Coordination BLAIN, HUNSUCKER ( ) []  - 0 Complex (extensive) Discharge Coordination PROCESS - Special Needs []  - Pediatric / Minor Patient Management 0 []  - 0 Isolation Patient Management []  - 0 Hearing / Language / Visual special needs []  - 0 Assessment of Community assistance (transportation, D/C planning, etc.) []  - 0 Additional assistance / Altered mentation []  -  0 Support Surface(s) Assessment (bed, cushion, seat, etc.) INTERVENTIONS - Wound Cleansing / Measurement X - Simple Wound Cleansing - one wound 1 5 []  - 0 Complex  Wound Cleansing - multiple wounds X- 1 5 Wound Imaging (photographs - any number of wounds) []  - 0 Wound Tracing (instead of photographs) X- 1 5 Simple Wound Measurement - one wound []  - 0 Complex Wound Measurement - multiple wounds INTERVENTIONS - Wound Dressings []  - Small Wound Dressing one or multiple wounds 0 []  - 0 Medium Wound Dressing one or multiple wounds []  - 0 Large Wound Dressing one or multiple wounds []  - 0 Application of Medications - topical []  - 0 Application of Medications - injection INTERVENTIONS - Miscellaneous []  - External ear exam 0 []  - 0 Specimen Collection (cultures, biopsies, blood, body fluids, etc.) []  - 0 Specimen(s) / Culture(s) sent or taken to Lab for analysis []  - 0 Patient Transfer (multiple staff / / Similar devices) []  - 0 Simple Staple / Suture removal (25 or less) []  - 0 Complex Staple / Suture removal (26 or more) []  - 0 Hypo / Hyperglycemic Management (close monitor of Blood Glucose) []  - 0 Ankle / Brachial Index (ABI) - do not check if billed separately X- 1 5 Vital Signs JOSEDEJESUS, MARCUM ( ) Has the patient been seen at the hospital within the last three years: Yes Total Score: 55 Level Of Care: New/Established - Level 2 Electronic Signature(s) Signed: 03/22/2019 5:08:08 PM By: , BSN, RN, CWS, Kim RN, BSN Entered By: , BSN, RN, CWS, Kim on 03/22/2019 09:36:14 ( ) -------------------------------------------------------------------------------- Encounter Discharge Information Details Patient Name: . Date of Service: 03/22/2019 9:15 AM Medical Record Number: Nurse, adult Patient Account Number: Date of Birth/Sex: 1965/09/11 (54 y.o. M) Treating RN: Primary Care Mariabelen Pressly:  Jon Garner, [id number] Other Clinician: Referring Ansley Mangiapane: 03/24/2019 Treating Alahni Varone/Extender: Elliot Gurney in Treatment: 3 Encounter Discharge Information Items Discharge Condition: Stable Ambulatory Status: Ambulatory Discharge Destination: Home Transportation: Private Auto Accompanied By: self Schedule Follow-up Appointment: Yes Clinical Summary of Care: Electronic Signature(s) Signed: 03/22/2019 5:08:08 PM By: 03/24/2019, BSN, RN, CWS, Kim RN, BSN Entered By: Jon Garner, BSN, RN, CWS, Kim on 03/22/2019 09:36:56 Jon Garner (03/24/2019) -------------------------------------------------------------------------------- Lower Extremity Assessment Details Patient Name: OLNEY, MONIER. Date of Service: 03/22/2019 9:15 AM Medical Record Number: 01/14/1966 Patient Account Number: [id number] Date of Birth/Sex: October 26, 1965 (54 y.o. M) Treating RN: Gerlene Burdock Primary Care Selda Jalbert: Megan Mans, Skeet Simmer Other Clinician: Referring Bora Bost: 03/24/2019, RICHARD Treating Hetty Linhart/Extender: Elliot Gurney, HOYT Weeks in Treatment: 3 Edema Assessment Assessed: [Left: No] [Right: No] Edema: [Left: Ye] [Right: s] Calf Left: Right: Point of Measurement: 32 cm From Medial Instep 41.5 cm cm Ankle Left: Right: Point of Measurement: 13 cm From Medial Instep 24 cm cm Vascular Assessment Pulses: Dorsalis Pedis Palpable: [Left:Yes] Electronic Signature(s) Signed: 03/22/2019 4:29:13 PM By: 03/24/2019 Entered By: Jon Garner on 03/22/2019 09:24:38 Jon Garner (03/24/2019) -------------------------------------------------------------------------------- Multi Wound Chart Details Patient Name: [id number]. Date of Service: 03/22/2019 9:15 AM Medical Record Number: 01/14/1966 Patient Account Number: [id number] Date of Birth/Sex: 1965-02-18 (54 y.o. M) Treating RN: Gerlene Burdock Primary Care Makenzi Bannister: Wendelyn Breslow, Linwood Dibbles Other Clinician: Referring Tamberlyn Midgley: 03/24/2019,  Curtis Sites Treating Everleigh Colclasure/Extender: Curtis Sites, HOYT Weeks in Treatment: 3 Vital Signs Height(in): 67 Pulse(bpm): 79 Weight(lbs): 280 Blood Pressure(mmHg): 144/60 Body Mass Index(BMI): 44 Temperature(F): 98.8 Respiratory Rate 16 (breaths/min): Photos: [N/A:N/A] Wound Location: Left, Proximal, Anterior Lower N/A N/A Leg Wounding Event: Gradually Appeared N/A N/A Primary Etiology: Venous Leg Ulcer N/A N/A Comorbid History: Coronary Artery Disease, N/A N/A Hypertension, Myocardial Infarction Date Acquired:  03/08/2019 N/A N/A Weeks of Treatment: 2 N/A N/A Wound Status: Healed - Epithelialized N/A N/A Clustered Wound: Yes N/A N/A Clustered Quantity: 1 N/A N/A Measurements L x W x D 0x0x0 N/A N/A (cm) Area (cm) : 0 N/A N/A Volume (cm) : 0 N/A N/A % Reduction in Area: 100.00% N/A N/A % Reduction in Volume: 100.00% N/A N/A Classification: Full Thickness Without N/A N/A Exposed Support Structures Exudate Amount: None Present N/A N/A Wound Margin: Flat and Intact N/A N/A Granulation Amount: None Present (0%) N/A N/A Necrotic Amount: Large (67-100%) N/A N/A Necrotic Tissue: Eschar N/A N/A Exposed Structures: Fat Layer (Subcutaneous N/A N/A Tissue) Exposed: Yes Fascia: No Tendon: No JED, KUTCH ([id number]) Muscle: No Joint: No Bone: No Epithelialization: Large (67-100%) N/A N/A Treatment Notes Electronic Signature(s) Signed: 03/22/2019 5:08:08 PM By: Elliot Gurney, BSN, RN, CWS, Kim RN, BSN Entered By: Elliot Gurney, BSN, RN, CWS, Kim on 03/22/2019 09:34:31 Jon Garner ([id number]) -------------------------------------------------------------------------------- Multi-Disciplinary Care Plan Details Patient Name: SAMAD, THON. Date of Service: 03/22/2019 9:15 AM Medical Record Number: [id number] Patient Account Number: [id number] Date of Birth/Sex: Jan 26, 1966 (54 y.o. M) Treating RN: Huel Coventry Primary Care Kameelah Minish: Wendelyn Breslow, Gerlene Burdock Other Clinician: Referring Sergei Delo:  Wendelyn Breslow, Gerlene Burdock Treating Bridgitt Raggio/Extender: Skeet Simmer in Treatment: 3 Active Inactive Electronic Signature(s) Signed: 03/22/2019 4:56:19 PM By: Elliot Gurney, BSN, RN, CWS, Kim RN, BSN Entered By: Elliot Gurney, BSN, RN, CWS, Kim on 03/22/2019 16:56:19 Jon Garner ([id number]) -------------------------------------------------------------------------------- Pain Assessment Details Patient Name: Jon Garner Date of Service: 03/22/2019 9:15 AM Medical Record Number: [id number] Patient Account Number: [id number] Date of Birth/Sex: 1965/12/14 (54 y.o. M) Treating RN: Curtis Sites Primary Care Eller Sweis: Wendelyn Breslow, Gerlene Burdock Other Clinician: Referring Thatcher Doberstein: Megan Mans Treating Kamaljit Hizer/Extender: Linwood Dibbles, HOYT Weeks in Treatment: 3 Active Problems Location of Pain Severity and Description of Pain Patient Has Paino No Site Locations Pain Management and Medication Current Pain Management: Electronic Signature(s) Signed: 03/22/2019 4:29:13 PM By: Curtis Sites Entered By: Curtis Sites on 03/22/2019 09:18:38 Jon Garner ([id number]) -------------------------------------------------------------------------------- Patient/Caregiver Education Details Patient Name: Jon Garner. Date of Service: 03/22/2019 9:15 AM Medical Record Number: [id number] Patient Account Number: [id number] Date of Birth/Gender: 25-Apr-1965 (54 y.o. M) Treating RN: Huel Coventry Primary Care Physician: Wendelyn Breslow, Gerlene Burdock Other Clinician: Referring Physician: Megan Mans Treating Physician/Extender: Skeet Simmer in Treatment: 3 Education Assessment Education Provided To: Patient Education Topics Provided Venous: Controlling Swelling with Multilayered Compression Wraps, Other: Gave patient information for ETI with Handouts: measurements Methods: Demonstration, Explain/Verbal Responses: State content correctly Electronic Signature(s) Signed: 03/22/2019 5:08:08 PM  By: Elliot Gurney, BSN, RN, CWS, Kim RN, BSN Entered By: Elliot Gurney, BSN, RN, CWS, Kim on 03/22/2019 09:43:37 Jon Garner ([id number]) -------------------------------------------------------------------------------- Wound Assessment Details Patient Name: Jon Garner. Date of Service: 03/22/2019 9:15 AM Medical Record Number: [id number] Patient Account Number: [id number] Date of Birth/Sex: 28-Oct-1965 (54 y.o. M) Treating RN: Huel Coventry Primary Care Keyston Ardolino: Wendelyn Breslow, Gerlene Burdock Other Clinician: Referring Geofrey Silliman: Wendelyn Breslow, Gerlene Burdock Treating Cintia Gleed/Extender: Linwood Dibbles, HOYT Weeks in Treatment: 3 Wound Status Wound Number: 2 Primary Venous Leg Ulcer Etiology: Wound Location: Left, Proximal, Anterior Lower Leg Wound Status: Healed - Epithelialized Wounding Event: Gradually Appeared Comorbid Coronary Artery Disease, Hypertension, Date Acquired: 03/08/2019 History: Myocardial Infarction Weeks Of Treatment: 2 Clustered Wound: Yes Photos Wound Measurements Length: (cm) Width: (cm) Depth: (cm) Clustered Quantity: Area: (cm) Volume: (cm) 0 % Reduction in Area: 100% 0 % Reduction in Volume: 100% 0 Epithelialization: Large (67-100%) 1 Tunneling: No 0  Undermining: No 0 Wound Description Full Thickness Without Exposed Support Classification: Structures Wound Margin: Flat and Intact Exudate None Present Amount: Foul Odor After Cleansing: No Slough/Fibrino No Wound Bed Granulation Amount: None Present (0%) Exposed Structure Necrotic Amount: Large (67-100%) Fascia Exposed: No Necrotic Quality: Eschar Fat Layer (Subcutaneous Tissue) Exposed: Yes Tendon Exposed: No Muscle Exposed: No Joint Exposed: No Bone Exposed: No Electronic Signature(s) ZAHMIR, LALLA ([id number]) Signed: 03/22/2019 5:08:08 PM By: Gretta Cool, BSN, RN, CWS, Kim RN, BSN Entered By: Gretta Cool, BSN, RN, CWS, Kim on 03/22/2019 09:34:18 [name]  ([id number]) -------------------------------------------------------------------------------- [facility] Details Patient Name: [name]. Date of Service: 03/22/2019 9:15 AM Medical Record Number: [id number] Patient Account Number: [id number] Date of Birth/Sex: 1965-05-22 (54 y.o. M) Treating RN: Montey Hora Primary Care Iain Sawchuk: Cranford Mon, RICHARD Other Clinician: Referring Azariah Latendresse: Wilhemena Durie Treating Jaymi Tinner/Extender: Melburn Hake, HOYT Weeks in Treatment: 3 Vital Signs Time Taken: 09:18 Temperature (F): 98.8 Height (in): 67 Pulse (bpm): 79 Weight (lbs): 280 Respiratory Rate (breaths/min): 16 Body Mass Index (BMI): 43.8 Blood Pressure (mmHg): 144/60 Reference Range: 80 - 120 mg / dl Electronic Signature(s) Signed: 03/22/2019 4:29:13 PM By: Montey Hora Entered By: Montey Hora on 03/22/2019 09:19:37

## 2019-03-22 NOTE — Progress Notes (Addendum)
Jon Garner ([id number]) Visit Report for 03/22/2019 Chief Complaint Document Details Patient Name: Jon Garner, Jon Garner. Date of Service: 03/22/2019 9:15 AM Medical Record Number: [id number] Patient Account Number: [id number] Date of Birth/Sex: 03-25-1965 (54 y.o. M) Treating RN: Cornell Barman Primary Care Provider: Cranford Mon, Delfino Lovett Other Clinician: Referring Provider: Cranford Mon, Delfino Lovett Treating Provider/Extender: Sharalyn Ink in Treatment: 3 Information Obtained from: Patient Chief Complaint Left LE Ulcer Electronic Signature(s) Signed: 03/22/2019 9:29:49 AM By: Worthy Keeler PA-C Entered By: Worthy Keeler on 03/22/2019 09:29:48 Jon Garner ([id number]) -------------------------------------------------------------------------------- HPI Details Patient Name: Jon Garner Date of Service: 03/22/2019 9:15 AM Medical Record Number: [id number] Patient Account Number: [id number] Date of Birth/Sex: [birth year] (55 y.o. M) Treating RN: Cornell Barman Primary Care Provider: Cranford Mon, Delfino Lovett Other Clinician: Referring Provider: Cranford Mon, Delfino Lovett Treating Provider/Extender: Sharalyn Ink in Treatment: 3 History of Present Illness HPI Description: 03/01/2019 upon evaluation today patient presents for initial inspection here in our clinic concerning issues he has been having for the past 2 months with a wound over the left lower leg anteriorly. He states that this just came up he really did not have any injury he is not sure what exactly caused that other than his legs are always "very tight". He has been referred by Dr. Harrell Gave End for arterial studies and actually has those later today. He also has a history of chronic venous insufficiency, hypertension, and coronary artery disease. Subsequently he is also gotten compression stockings in the past 2 weeks although obviously somewhat hard to put that on on the left side being that again he has significant  issues with swelling on top of the fact has been having put a dressing over this area as well that makes even that much more difficult. Nonetheless he does need good compression wrap possibly even a 4-layer compression wrap to get some of the edema down but we need to make sure his arterial flow is good prior to that. Fortunately there is no evidence of active infection. No fevers, chills, nausea, vomiting, or diarrhea. 03/08/2019 upon evaluation today patient appears to be doing excellent with regard to his lower extremity ulcers. In fact when we were managing last week seems to be doing much better and in fact is dramatically improved even compared to previous. With that being said his current issue is that he has a couple of the blisters that have reopened and that is more recent. With that being said I do believe that we can continue to manage this and I believe now that we know he has sufficient arterial flow we can even increase the compression to a 3 layer compression wrap which should be of benefit. 03/15/2019 upon evaluation today patient appears to be doing excellent with regard to his wound. In fact the last remaining area is almost completely healed this is just a small blister that came up last week and again has almost closed back over. The original sore is completely healed and in general I think he has done excellent. I see no signs of active infection at this point. The patient is very pleased with how things seem to be progressing. 03/22/2019 upon evaluation today patient appears to be doing excellent in regard to his leg ulcer which indeed is actually healed. He does not seem to be having any evidence of active infection and no drainage at this point which is great news overall I am extremely pleased with the fact that he is doing  so well. The patient likewise is extremely happy to see things doing as well as they are. He did show me his knee compression brace as well as the  compression sock which was somewhat short in my opinion that he was planning to wear I think this will probably be a bad idea I think he needs more traditional compression stockings and he can still wear the brace on his knee no problems. Electronic Signature(s) Signed: 03/22/2019 4:26:52 PM By: Lenda Kelp PA-C Entered By: Lenda Kelp on 03/22/2019 16:26:52 Jon Garner ([id number]) -------------------------------------------------------------------------------- Physical Exam Details Patient Name: Jon Garner Date of Service: 03/22/2019 9:15 AM Medical Record Number: [id number] Patient Account Number: [id number] Date of Birth/Sex: [birth year] (54 y.o. M) Treating RN: Huel Coventry Primary Care Provider: Wendelyn Breslow, Gerlene Burdock Other Clinician: Referring Provider: Wendelyn Breslow, RICHARD Treating Provider/Extender: Linwood Dibbles, Abid Bolla Weeks in Treatment: 3 Constitutional Well-nourished and well-hydrated in no acute distress. Respiratory normal breathing without difficulty. Psychiatric this patient is able to make decisions and demonstrates good insight into disease process. Alert and Oriented x 3. pleasant and cooperative. Notes Upon inspection again patient's wounds appear to be completely healed there is no signs of active infection and overall I am extremely pleased with how things are doing at this point. I think he just can need to continue with appropriate compression in order to keep the edema under good control. We discussed this in a good amount of detail today as far as specifics of how to do this are concerned Electronic Signature(s) Signed: 03/22/2019 4:27:24 PM By: Lenda Kelp PA-C Entered By: Lenda Kelp on 03/22/2019 16:27:24 Jon Garner ([id number]) -------------------------------------------------------------------------------- Physician Orders Details Patient Name: Jon Garner. Date of Service: 03/22/2019 9:15 AM Medical Record Number:  [id number] Patient Account Number: [id number] Date of Birth/Sex: [birth year] (54 y.o. M) Treating RN: Huel Coventry Primary Care Provider: Wendelyn Breslow, Gerlene Burdock Other Clinician: Referring Provider: Wendelyn Breslow, Gerlene Burdock Treating Provider/Extender: Skeet Simmer in Treatment: 3 Verbal / Phone Orders: No Diagnosis Coding ICD-10 Coding Code Description I87.2 Venous insufficiency (chronic) (peripheral) L97.822 Non-pressure chronic ulcer of other part of left lower leg with fat layer exposed I10 Essential (primary) hypertension I25.10 Atherosclerotic heart disease of native coronary artery without angina pectoris Edema Control o Patient to wear own compression stockings - Tubigrip G (Double Layer) in clinic Discharge From [facility] Services o Discharge from Wound Care Center - Treatment complete Electronic Signature(s) Signed: 03/22/2019 4:43:11 PM By: Lenda Kelp PA-C Signed: 03/22/2019 5:08:08 PM By: Elliot Gurney, BSN, RN, CWS, Kim RN, BSN Entered By: Elliot Gurney, BSN, RN, CWS, Kim on 03/22/2019 09:35:45 Jon Garner ([id number]) -------------------------------------------------------------------------------- Problem List Details Patient Name: Jon Garner. Date of Service: 03/22/2019 9:15 AM Medical Record Number: [id number] Patient Account Number: [id number] Date of Birth/Sex: 05-29-1965 (54 y.o. M) Treating RN: Huel Coventry Primary Care Provider: Wendelyn Breslow, Gerlene Burdock Other Clinician: Referring Provider: Wendelyn Breslow, Gerlene Burdock Treating Provider/Extender: Skeet Simmer in Treatment: 3 Active Problems ICD-10 Evaluated Encounter Code Description Active Date Today Diagnosis I87.2 Venous insufficiency (chronic) (peripheral) 03/01/2019 No Yes L97.822 Non-pressure chronic ulcer of other part of left lower leg with 03/01/2019 No Yes fat layer exposed I10 Essential (primary) hypertension 03/01/2019 No Yes I25.10 Atherosclerotic heart disease of native coronary artery 03/01/2019 No  Yes without angina pectoris Inactive Problems Resolved Problems Electronic Signature(s) Signed: 03/22/2019 9:29:43 AM By: Lenda Kelp PA-C Entered By: Lenda Kelp on 03/22/2019 09:29:42 Jon Garner ([id number]) -------------------------------------------------------------------------------- Progress Note Details  Patient Name: JOHNMICHAEL, MELHORN. Date of Service: 03/22/2019 9:15 AM Medical Record Number: [id number] Patient Account Number: [id number] Date of Birth/Sex: [birth year] (54 y.o. M) Treating RN: Huel Coventry Primary Care Provider: Wendelyn Breslow, Gerlene Burdock Other Clinician: Referring Provider: Wendelyn Breslow, Gerlene Burdock Treating Provider/Extender: Skeet Simmer in Treatment: 3 Subjective Chief Complaint Information obtained from Patient Left LE Ulcer History of Present Illness (HPI) 03/01/2019 upon evaluation today patient presents for initial inspection here in our clinic concerning issues he has been having for the past 2 months with a wound over the left lower leg anteriorly. He states that this just came up he really did not have any injury he is not sure what exactly caused that other than his legs are always "very tight". He has been referred by Dr. Cristal Deer End for arterial studies and actually has those later today. He also has a history of chronic venous insufficiency, hypertension, and coronary artery disease. Subsequently he is also gotten compression stockings in the past 2 weeks although obviously somewhat hard to put that on on the left side being that again he has significant issues with swelling on top of the fact has been having put a dressing over this area as well that makes even that much more difficult. Nonetheless he does need good compression wrap possibly even a 4-layer compression wrap to get some of the edema down but we need to make sure his arterial flow is good prior to that. Fortunately there is no evidence of active infection. No fevers, chills,  nausea, vomiting, or diarrhea. 03/08/2019 upon evaluation today patient appears to be doing excellent with regard to his lower extremity ulcers. In fact when we were managing last week seems to be doing much better and in fact is dramatically improved even compared to previous. With that being said his current issue is that he has a couple of the blisters that have reopened and that is more recent. With that being said I do believe that we can continue to manage this and I believe now that we know he has sufficient arterial flow we can even increase the compression to a 3 layer compression wrap which should be of benefit. 03/15/2019 upon evaluation today patient appears to be doing excellent with regard to his wound. In fact the last remaining area is almost completely healed this is just a small blister that came up last week and again has almost closed back over. The original sore is completely healed and in general I think he has done excellent. I see no signs of active infection at this point. The patient is very pleased with how things seem to be progressing. 03/22/2019 upon evaluation today patient appears to be doing excellent in regard to his leg ulcer which indeed is actually healed. He does not seem to be having any evidence of active infection and no drainage at this point which is great news overall I am extremely pleased with the fact that he is doing so well. The patient likewise is extremely happy to see things doing as well as they are. He did show me his knee compression brace as well as the compression sock which was somewhat short in my opinion that he was planning to wear I think this will probably be a bad idea I think he needs more traditional compression stockings and he can still wear the brace on his knee no problems. Objective Constitutional Well-nourished and well-hydrated in no acute distress. COLEBY, YETT ([id number]) Vitals Time Taken: 9:18  AM, Height: 67 in, Weight:  280 lbs, BMI: 43.8, Temperature: 98.8 F, Pulse: 79 bpm, Respiratory Rate: 16 breaths/min, Blood Pressure: 144/60 mmHg. Respiratory normal breathing without difficulty. Psychiatric this patient is able to make decisions and demonstrates good insight into disease process. Alert and Oriented x 3. pleasant and cooperative. General Notes: Upon inspection again patient's wounds appear to be completely healed there is no signs of active infection and overall I am extremely pleased with how things are doing at this point. I think he just can need to continue with appropriate compression in order to keep the edema under good control. We discussed this in a good amount of detail today as far as specifics of how to do this are concerned Integumentary (Hair, Skin) Wound #2 status is Healed - Epithelialized. Original cause of wound was Gradually Appeared. The wound is located on the Left,Proximal,Anterior Lower Leg. The wound measures 0cm length x 0cm width x 0cm depth; 0cm^2 area and 0cm^3 volume. There is Fat Layer (Subcutaneous Tissue) Exposed exposed. There is no tunneling or undermining noted. There is a none present amount of drainage noted. The wound margin is flat and intact. There is no granulation within the wound bed. There is a large (67-100%) amount of necrotic tissue within the wound bed including Eschar. Assessment Active Problems ICD-10 Venous insufficiency (chronic) (peripheral) Non-pressure chronic ulcer of other part of left lower leg with fat layer exposed Essential (primary) hypertension Atherosclerotic heart disease of native coronary artery without angina pectoris Plan Edema Control: Patient to wear own compression stockings - Tubigrip G (Double Layer) in clinic Discharge From [facility] Services: Discharge from Wound Care Center - Treatment complete 1. My suggestion at this time is can be that we going to continue with the current recommendations for compression he will actually  see about getting some compression stockings and he was given information for elastic therapy in [place]. 2. I am also can recommend that for the time being he wear the double layer Tubigrip in order to at least have some compression until we can receive these stockings. ZACKRY, DEINES ([id number]) We will see the patient back for follow-up visit as needed Electronic Signature(s) Signed: 03/22/2019 4:27:54 PM By: Lenda Kelp PA-C Entered By: Lenda Kelp on 03/22/2019 16:27:54 Jon Garner ([id number]) -------------------------------------------------------------------------------- SuperBill Details Patient Name: Jon Garner Date of Service: 03/22/2019 Medical Record Number: [id number] Patient Account Number: [id number] Date of Birth/Sex: [birth year] (54 y.o. M) Treating RN: Huel Coventry Primary Care Provider: Wendelyn Breslow, Gerlene Burdock Other Clinician: Referring Provider: Wendelyn Breslow, Gerlene Burdock Treating Provider/Extender: Linwood Dibbles, Rahiem Schellinger Weeks in Treatment: 3 Diagnosis Coding ICD-10 Codes Code Description I87.2 Venous insufficiency (chronic) (peripheral) L97.822 Non-pressure chronic ulcer of other part of left lower leg with fat layer exposed I10 Essential (primary) hypertension I25.10 Atherosclerotic heart disease of native coronary artery without angina pectoris Facility Procedures CPT4 Code: 08657846 Description: 96295 - WOUND CARE VISIT-LEV 2 EST PT Modifier: Quantity: 1 Physician Procedures CPT4 Code Description: 2841324 99213 - WC PHYS LEVEL 3 - EST PT ICD-10 Diagnosis Description I87.2 Venous insufficiency (chronic) (peripheral) L97.822 Non-pressure chronic ulcer of other part of left lower leg wit I10 Essential (primary) hypertension  I25.10 Atherosclerotic heart disease of native coronary artery withou Modifier: h fat layer expos t angina pectoris Quantity: 1 ed Electronic Signature(s) Signed: 03/22/2019 4:28:22 PM By: Lenda Kelp PA-C Entered By: Lenda Kelp on 03/22/2019 40:10:27

## 2019-03-29 ENCOUNTER — Encounter: Payer: Managed Care, Other (non HMO) | Admitting: Physician Assistant

## 2019-04-22 ENCOUNTER — Ambulatory Visit: Payer: Managed Care, Other (non HMO) | Admitting: Internal Medicine

## 2019-05-06 ENCOUNTER — Ambulatory Visit (INDEPENDENT_AMBULATORY_CARE_PROVIDER_SITE_OTHER): Payer: Managed Care, Other (non HMO) | Admitting: Physician Assistant

## 2019-05-06 VITALS — BP 140/84 | HR 98 | Temp 97.3°F

## 2019-05-06 DIAGNOSIS — Z01818 Encounter for other preprocedural examination: Secondary | ICD-10-CM | POA: Insufficient documentation

## 2019-05-06 DIAGNOSIS — R42 Dizziness and giddiness: Secondary | ICD-10-CM | POA: Diagnosis not present

## 2019-05-06 DIAGNOSIS — M129 Arthropathy, unspecified: Secondary | ICD-10-CM | POA: Insufficient documentation

## 2019-05-06 DIAGNOSIS — E669 Obesity, unspecified: Secondary | ICD-10-CM | POA: Insufficient documentation

## 2019-05-06 DIAGNOSIS — I959 Hypotension, unspecified: Secondary | ICD-10-CM

## 2019-05-06 DIAGNOSIS — M25569 Pain in unspecified knee: Secondary | ICD-10-CM | POA: Insufficient documentation

## 2019-05-06 DIAGNOSIS — I6350 Cerebral infarction due to unspecified occlusion or stenosis of unspecified cerebral artery: Secondary | ICD-10-CM | POA: Insufficient documentation

## 2019-05-06 DIAGNOSIS — S86819A Strain of other muscle(s) and tendon(s) at lower leg level, unspecified leg, initial encounter: Secondary | ICD-10-CM | POA: Insufficient documentation

## 2019-05-06 DIAGNOSIS — Z87442 Personal history of urinary calculi: Secondary | ICD-10-CM | POA: Insufficient documentation

## 2019-05-06 NOTE — Progress Notes (Signed)
Patient: Jon Garner. Male    DOB: Sep 24, 1965   54 y.o.   MRN: [id number] Visit Date: 05/06/2019  Today's Provider: Trey Sailors, PA-C   Chief Complaint  Patient presents with  . Fever  . Shortness of Breath  . Diarrhea  . Cough   Subjective:    Virtual Visit via Telephone Note  I connected with Jon Garner. on 05/06/19 at  9:40 AM EDT by telephone and verified that I am speaking with the correct person using two identifiers.  Location: Patient: Home Provider: Office    I discussed the limitations, risks, security and privacy concerns of performing an evaluation and management service by telephone and the availability of in person appointments. I also discussed with the patient that there may be a patient responsible charge related to this service. The patient expressed understanding and agreed to proceed.  Shortness of Breath The current episode started yesterday. The problem occurs intermittently. Associated symptoms include a fever and rhinorrhea. The symptoms are aggravated by exercise and any activity. He has tried rest for the symptoms. The treatment provided mild relief. His past medical history is significant for a heart failure.   Patient reports episode a couple of days ago where he was light headed, SOB, and had diarrhea. He has a history of NSTEMI, HTD, HLD. He is taking metoprolol, lisinopril, and Lasix PRN. He did take Lasix around the time of the episode. Reports feeling better today. He did not have chest pain/pressure radiating down to his arms or jaw. He denies nausea and vomiting. He checked his blood pressure when he felt light headed and it was 77/58. He was eating and drinking that day. He did not pass out. He checked his blood pressure today and it was 129/66. He reports the light headedness has resolved as well. Reports intermittent SOB walking for a distance. Has not had a bowel movement today. Did not get tested for COVID.   Allergies    Allergen Reactions  . Penicillins Other (See Comments)    Has patient had a PCN reaction causing immediate rash, facial/tongue/throat swelling, SOB or lightheadedness with hypotension: Unknown Has patient had a PCN reaction causing severe rash involving mucus membranes or skin necrosis: Unknown Has patient had a PCN reaction that required hospitalization: Unknown Has patient had a PCN reaction occurring within the last 10 years: Unknown If all of the above answers are "NO", then may proceed with Cephalosporin use.      Current Outpatient Medications:  .  aspirin EC 81 MG tablet, Take 81 mg by mouth once., Disp: , Rfl:  .  atorvastatin (LIPITOR) 80 MG tablet, Take 1 tablet (80 mg total) by mouth daily at 6 PM., Disp: 90 tablet, Rfl: 1 .  furosemide (LASIX) 40 MG tablet, Take 1 tablet (40 mg total) by mouth daily., Disp: 90 tablet, Rfl: 3 .  lisinopril (ZESTRIL) 40 MG tablet, Take 1 tablet (40 mg total) by mouth daily., Disp: 90 tablet, Rfl: 2 .  metoprolol tartrate (LOPRESSOR) 25 MG tablet, TAKE 1 TABLET(25 MG) BY MOUTH TWICE DAILY, Disp: 180 tablet, Rfl: 2 .  nitroGLYCERIN (NITROSTAT) 0.4 MG SL tablet, Place 1 tablet (0.4 mg total) under the tongue every 5 (five) minutes as needed for chest pain. Maximum of 3 doses., Disp: 25 tablet, Rfl: 3 .  omeprazole (PRILOSEC) 20 MG capsule, TAKE 1 CAPSULE DAILY (DUE FOR OFFICE VISIT BEFORE NEXT REFILL), Disp: 90 capsule, Rfl: 3 .  potassium chloride (K-DUR) 10 MEQ tablet, Take 1 tablet (10 mEq total) by mouth daily., Disp: 90 tablet, Rfl: 3 .  vitamin C (ASCORBIC ACID) 500 MG tablet, Take 500 mg by mouth daily as needed., Disp: , Rfl:   Review of Systems  Constitutional: Positive for fever.  HENT: Positive for rhinorrhea. Negative for congestion.   Eyes: Negative.   Respiratory: Positive for shortness of breath.   Gastrointestinal: Positive for diarrhea.  Endocrine: Negative.   Genitourinary: Negative.   Musculoskeletal: Negative.    Allergic/Immunologic: Negative.   Neurological: Positive for light-headedness.  Hematological: Negative.   Psychiatric/Behavioral: Negative.     Social History   Tobacco Use  . Smoking status: Former Smoker    Packs/day: 1.50    Years: 10.00    Pack years: 15.00    Types: Cigarettes    Quit date: 12/05/2013    Years since quitting: 5.4  . Smokeless tobacco: Never Used  Substance Use Topics  . Alcohol use: Not Currently      Objective:   BP 140/84   Pulse 98   Temp (!) 97.3 F (36.3 C) (Temporal)  Vitals:   05/06/19 0912  BP: 140/84  Pulse: 98  Temp: (!) 97.3 F (36.3 C)  TempSrc: Temporal  There is no height or weight on file to calculate BMI.   Physical Exam   No results found for any visits on 05/06/19.     Assessment & Plan    1. Light headedness  Suspect volume depletion due to Lasix and diarrhea. Patient is feeling better today and BP is normotensive. Advised on COVID test and how to schedule. I have also scheduled him for a COVID vaccine on 05/10/2019 in [place] with [facility] If his COVID test is negative, he should go ahead and get vaccinated. Advised on maintaining hydration.   2. Hypotension, unspecified hypotension type   I discussed the assessment and treatment plan with the patient. The patient was provided an opportunity to ask questions and all were answered. The patient agreed with the plan and demonstrated an understanding of the instructions.   The patient was advised to call back or seek an in-person evaluation if the symptoms worsen or if the condition fails to improve as anticipated.  I provided 15 minutes of non-face-to-face time during this encounter.  The entirety of the information documented in the History of Present Illness, Review of Systems and Physical Exam were personally obtained by me. Portions of this information were initially documented by [facility], CMA and reviewed by me for thoroughness and accuracy.          Trinna Post, PA-C  [facility]

## 2019-05-10 ENCOUNTER — Ambulatory Visit: Payer: Managed Care, Other (non HMO) | Attending: Internal Medicine

## 2019-05-10 DIAGNOSIS — Z23 Encounter for immunization: Secondary | ICD-10-CM

## 2019-05-10 NOTE — Progress Notes (Signed)
   [id number]  Name:  Jon Garner.    MRN: [id number] DOB: [birth year]  05/10/2019  Jon Garner was observed post Covid-19 immunization for 15 minutes without incident. He was provided with Vaccine Information Sheet and instruction to access the V-Safe system.   Jon Garner was instructed to call 911 with any severe reactions post vaccine: Marland Kitchen Difficulty breathing  . Swelling of face and throat  . A fast heartbeat  . A bad rash all over body  . Dizziness and weakness   Immunizations Administered    Name Date Dose VIS Date Route   Pfizer COVID-19 Vaccine 05/10/2019  9:48 AM 0.3 mL 01/15/2019 Intramuscular   Manufacturer: [employer], [employer]   Lot: [id number]   NDC: [address]-8

## 2019-05-13 ENCOUNTER — Other Ambulatory Visit: Payer: Managed Care, Other (non HMO)

## 2019-06-02 ENCOUNTER — Ambulatory Visit: Payer: Managed Care, Other (non HMO) | Attending: Internal Medicine

## 2019-06-02 DIAGNOSIS — Z23 Encounter for immunization: Secondary | ICD-10-CM

## 2019-06-02 NOTE — Progress Notes (Signed)
   [id number]  Name:  Tri Chittick.    MRN: [id number] DOB: October 30, 1965  06/02/2019  Mr. Capozzi was observed post Covid-19 immunization for 15 minutes without incident. He was provided with Vaccine Information Sheet and instruction to access the V-Safe system.   Mr. Denomme was instructed to call 911 with any severe reactions post vaccine: Marland Kitchen Difficulty breathing  . Swelling of face and throat  . A fast heartbeat  . A bad rash all over body  . Dizziness and weakness   Immunizations Administered    Name Date Dose VIS Date Route   Pfizer COVID-19 Vaccine 06/02/2019 10:27 AM 0.3 mL 03/31/2018 Intramuscular   Manufacturer: [employer], [employer]   Lot: [id number]   NDC: [address]-3

## 2019-06-16 ENCOUNTER — Ambulatory Visit: Payer: Managed Care, Other (non HMO) | Admitting: Internal Medicine

## 2019-06-16 NOTE — Progress Notes (Signed)
Follow-up Outpatient Visit Date: 06/17/2019  Primary Care Provider: Maple Hudson., MD [address] [place] [address]  Chief Complaint: Follow-up coronary artery disease  HPI:  Mr. Cypress is a 54 y.o. 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  I last saw Mr. Chavira in February, at which time he noted improvement in his leg edema with addition of furosemide 40 mg twice daily.  He was being followed in the wound care clinic.  Sleep study for exclusion of obstructive sleep apnea was pending.  Today, Mr. Kelley reports that he is feeling well.  His only complaint is of leg pain that he attributes to bilateral knee problems.  He denies chest pain, shortness of breath, palpitations, lightheadedness, and orthopnea.  He notes occasional lower extremity edema, though it has improved considerably.  Wounds on both legs have healed, and he has been released by the wound care clinic.  He is able to climb 2 flights of stairs without chest pain or dyspnea.  --------------------------------------------------------------------------------------------------  Past Medical History:  Diagnosis Date  . CAD (coronary artery disease)    a. LHC 4/19: pLAD 95%, mLAD 95%, m/dLAD 90%, ostD2 95%, pLCx 80%, mRCA 70%, dRCA 80%; b. 4-V CABG 05/2017: LIMA-LAD, VG-D1, VG-PDA, VG-OM2  . Essential hypertension 2014  . GERD (gastroesophageal reflux disease) 2015  . Morbid obesity (HCC) presently  . PXE (pseudoxanthoma elasticum) 1992  . Snores presently  . Stroke [facility]) 1992   a. Age 40   Past Surgical History:  Procedure Laterality Date  . CORONARY ARTERY BYPASS GRAFT N/A 05/29/2017   Procedure: CORONARY  ARTERY BYPASS GRAFTING (CABG)  X 4 WITH OPEN HARVESTING OF LEFT AND RIGHT SAPHENOUS VEINS;  Surgeon: Loreli Slot, MD;  Location: [facility] OR;  Service: Open Heart Surgery;  Laterality: N/A;  . KNEE SURGERY Bilateral   . LEFT HEART CATH AND CORONARY ANGIOGRAPHY N/A 05/26/2017   Procedure: LEFT HEART CATH AND CORONARY ANGIOGRAPHY;  Surgeon: Antonieta Iba, MD;  Location: [facility] INVASIVE CV LAB;  Service: Cardiovascular;  Laterality: N/A;  . TEE WITHOUT CARDIOVERSION N/A 05/29/2017   Procedure: TRANSESOPHAGEAL ECHOCARDIOGRAM (TEE);  Surgeon: Loreli Slot, MD;  Location: [facility] OR;  Service: Open Heart Surgery;  Laterality: N/A;    Current Meds  Medication Sig  . aspirin EC 81 MG tablet Take 81 mg by mouth once.  Marland Kitchen atorvastatin (LIPITOR) 80 MG tablet Take 1 tablet (80 mg total) by mouth daily at 6 PM.  . furosemide (LASIX) 40 MG tablet Take 1 tablet (40 mg total) by mouth daily.  Marland Kitchen lisinopril (ZESTRIL) 40 MG tablet Take 1 tablet (40 mg total) by mouth daily.  . metoprolol tartrate (LOPRESSOR) 25 MG tablet TAKE 1 TABLET(25 MG) BY MOUTH TWICE DAILY  . nitroGLYCERIN (NITROSTAT) 0.4 MG SL tablet Place 1 tablet (0.4 mg total) under the tongue every 5 (five) minutes as needed for chest pain. Maximum of 3 doses.  . potassium chloride (KLOR-CON) 10 MEQ tablet Take 1 tablet (10 mEq total) by mouth daily.  . vitamin C (ASCORBIC ACID) 500 MG tablet Take 500 mg by mouth daily as needed.  . [DISCONTINUED] atorvastatin (LIPITOR) 80 MG tablet Take 1 tablet (80 mg total) by mouth daily at 6 PM.  . [DISCONTINUED]  furosemide (LASIX) 40 MG tablet Take 1 tablet (40 mg total) by mouth daily.  . [DISCONTINUED] lisinopril (ZESTRIL) 40 MG tablet Take 1 tablet (40 mg total) by mouth daily.  . [DISCONTINUED] potassium chloride (K-DUR) 10 MEQ tablet Take 1 tablet (10 mEq total) by mouth daily.    Allergies: Penicillins  Social History   Tobacco Use  . Smoking status: Former Smoker    Packs/day: 1.50     Years: 10.00    Pack years: 15.00    Types: Cigarettes    Quit date: 12/05/2013    Years since quitting: 5.5  . Smokeless tobacco: Never Used  Substance Use Topics  . Alcohol use: Not Currently  . Drug use: Never    Family History  Problem Relation Age of Onset  . Dementia Mother   . Other Father        died in his 37's  . COPD Father   . Multiple sclerosis Sister     Review of Systems: A 12-system review of systems was performed and was negative except as noted in the HPI.  --------------------------------------------------------------------------------------------------  Physical Exam: BP 126/78 (BP Location: Left Arm, Patient Position: Sitting, Cuff Size: Large)   Pulse 88   Ht 5\' 8"  (1.727 m)   Wt 299 lb 6 oz (135.8 kg)   SpO2 98%   BMI 45.52 kg/m   General: NAD. Neck: No JVD or HJR. Lungs: Clear to auscultation without wheezes or crackles. Heart: Regular rate and rhythm without murmurs, rubs, or gallops. Abdomen: Obese but soft. Extremities: 1+ chronic pretibial edema.  EKG: Normal sinus rhythm with left axis deviation, low voltage, and inferior Q waves.  No significant change from prior tracing on 03/17/19.  Lab Results  Component Value Date   WBC 8.4 02/24/2019   HGB 14.5 02/24/2019   HCT 43.0 02/24/2019   MCV 88 02/24/2019   PLT 271 02/24/2019    Lab Results  Component Value Date   NA 138 03/17/2019   K 4.9 03/17/2019   CL 104 03/17/2019   CO2 18 (L) 03/17/2019   BUN 16 03/17/2019   CREATININE 1.03 03/17/2019   GLUCOSE 96 03/17/2019   ALT 17 02/24/2019    Lab Results  Component Value Date   CHOL 103 02/24/2019   HDL 33 (L) 02/24/2019   LDLCALC 54 02/24/2019   TRIG 81 02/24/2019   CHOLHDL 3.1 02/24/2019    --------------------------------------------------------------------------------------------------  ASSESSMENT AND PLAN: Coronary artery disease: Mr. Morriss is doing well without signs or symptoms to suggest worsening coronary  insufficiency.  He should continue his current medications for secondary prevention.  Chronic HFpEF: Mr. Canada reports NYHA class I-2 symptoms, predominantly limited by knee pain.  He has 1+ chronic appearing leg edema, improved from prior visits.  I encouraged him to remain on furosemide and to use compression stockings when possible..  Hypertension: Blood pressure is well controlled today.  No medication changes at this time.  Hyperlipidemia: LDL at goal.  Continue high intensity statin therapy.  Preoperative cardiovascular risk assessment: Mr. Bazen is hoping to proceed with knee replacements in the next few months.  He does not have any unstable cardiac symptoms and is able to climb at least 2 flights of stairs without angina or significant dyspnea.  I think it is reasonable for him to proceed with elective knee surgery without additional cardiac testing or intervention.  If possible, low-dose aspirin should be continued in the perioperative period unless there is an excessively high risk for bleeding.  Follow-up: Return to clinic in 6 months.  Yvonne Kendall, MD 06/17/2019 11:15 AM

## 2019-06-17 ENCOUNTER — Encounter: Payer: Self-pay | Admitting: Internal Medicine

## 2019-06-17 ENCOUNTER — Other Ambulatory Visit: Payer: Self-pay

## 2019-06-17 ENCOUNTER — Ambulatory Visit (INDEPENDENT_AMBULATORY_CARE_PROVIDER_SITE_OTHER): Payer: Managed Care, Other (non HMO) | Admitting: Internal Medicine

## 2019-06-17 VITALS — BP 126/78 | HR 88 | Ht 68.0 in | Wt 299.4 lb

## 2019-06-17 DIAGNOSIS — E785 Hyperlipidemia, unspecified: Secondary | ICD-10-CM | POA: Diagnosis not present

## 2019-06-17 DIAGNOSIS — I10 Essential (primary) hypertension: Secondary | ICD-10-CM

## 2019-06-17 DIAGNOSIS — Z01818 Encounter for other preprocedural examination: Secondary | ICD-10-CM

## 2019-06-17 DIAGNOSIS — I2510 Atherosclerotic heart disease of native coronary artery without angina pectoris: Secondary | ICD-10-CM | POA: Diagnosis not present

## 2019-06-17 DIAGNOSIS — I5032 Chronic diastolic (congestive) heart failure: Secondary | ICD-10-CM

## 2019-06-17 MED ORDER — POTASSIUM CHLORIDE ER 10 MEQ PO TBCR: 10.0000 meq | EXTENDED_RELEASE_TABLET | Freq: Every day | ORAL | 3 refills | Status: DC

## 2019-06-17 MED ORDER — LISINOPRIL 40 MG PO TABS: 40.0000 mg | ORAL_TABLET | Freq: Every day | ORAL | 2 refills | Status: DC

## 2019-06-17 MED ORDER — ATORVASTATIN CALCIUM 80 MG PO TABS: 80.0000 mg | ORAL_TABLET | Freq: Every day | ORAL | 1 refills | Status: DC

## 2019-06-17 MED ORDER — FUROSEMIDE 40 MG PO TABS: 40.0000 mg | ORAL_TABLET | Freq: Every day | ORAL | 3 refills | Status: DC

## 2019-06-17 NOTE — Patient Instructions (Signed)
Medication Instructions:  Your physician recommends that you continue on your current medications as directed. Please refer to the Current Medication list given to you today.  *If you need a refill on your cardiac medications before your next appointment, please call your pharmacy*  Follow-Up: At [facility], you and your health needs are our priority.  As part of our continuing mission to provide you with exceptional heart care, we have created designated Provider Care Teams.  These Care Teams include your primary Cardiologist (physician) and Advanced Practice Providers (APPs -  Physician Assistants and Nurse Practitioners) who all work together to provide you with the care you need, when you need it.  We recommend signing up for the patient portal called "MyChart".  Sign up information is provided on this After Visit Summary.  MyChart is used to connect with patients for Virtual Visits (Telemedicine).  Patients are able to view lab/test results, encounter notes, upcoming appointments, etc.  Non-urgent messages can be sent to your provider as well.   To learn more about what you can do with MyChart, go to [email or web address].    Your next appointment:   6 month(s)  The format for your next appointment:   In Person  Provider:    You may see DR CHRISTOPHER END or one of the following Advanced Practice Providers on your designated Care Team:    Christopher Berge, NP  Ryan Dunn, PA-C  Jacquelyn Visser, PA-C   

## 2019-07-12 ENCOUNTER — Telehealth: Payer: Self-pay | Admitting: Internal Medicine

## 2019-07-12 NOTE — Telephone Encounter (Signed)
° °  [facility] Pre-operative Risk Assessment    HEARTCARE STAFF: - Please ensure there is not already an duplicate clearance open for this procedure. - Under Visit Info/Reason for Call, type in Other and utilize the format Clearance MM/DD/YY or Clearance TBD. Do not use dashes or single digits. - If request is for dental extraction, please clarify the # of teeth to be extracted.  Request for surgical clearance:  1. What type of surgery is being performed? RT TKA  2. When is this surgery scheduled? 08/12/19  3. What type of clearance is required (medical clearance vs. Pharmacy clearance to hold med vs. Both)? both  4. Are there any medications that need to be held prior to surgery and how long? Not listed, please advise if needed  5. Practice name and name of physician performing surgery? [facility] - Dr Mcarthur Rossetti  6. What is the office phone number? [phone] ext [phone]   7.   What is the office fax number? [phone]  8.   Anesthesia type (None, local, MAC, general) ? Choice   Ace Gins 07/12/2019, 4:23 PM  _________________________________________________________________   (provider comments below)

## 2019-07-13 NOTE — Telephone Encounter (Signed)
   Primary Cardiologist: Julien Nordmann, MD  Chart reviewed as part of pre-operative protocol coverage. Patient was recently seen by Dr. Okey Dupre on 06/17/2019 and was doing well from a cardiac standpoint at that time. Dr. Okey Dupre addressed pre-op risk assessment for upcoming knee surgery at that time. Per his note: "Patient does not have any unstable cardiac symptoms and is able to climb at least 2 flights of stairs without angina or significant dyspnea.  I think it is reasonable for him to proceed with elective knee surgery without additional cardiac testing or intervention.  If possible, low-dose aspirin should be continued in the perioperative period unless there is an excessively high risk for bleeding."   I will route this recommendation to the requesting party via Epic fax function and remove from pre-op pool.  Please call with questions.  Corrin Parker, PA-C 07/13/2019, 8:41 AM

## 2019-07-25 ENCOUNTER — Other Ambulatory Visit: Payer: Self-pay | Admitting: Orthopedic Surgery

## 2019-08-02 ENCOUNTER — Encounter
Admission: RE | Admit: 2019-08-02 | Discharge: 2019-08-02 | Disposition: A | Discharge status: Home / Self Care | Payer: Managed Care, Other (non HMO) | Source: Ambulatory Visit | Attending: Orthopedic Surgery | Admitting: Orthopedic Surgery

## 2019-08-02 ENCOUNTER — Other Ambulatory Visit: Payer: Self-pay

## 2019-08-02 DIAGNOSIS — Z01812 Encounter for preprocedural laboratory examination: Secondary | ICD-10-CM | POA: Insufficient documentation

## 2019-08-02 HISTORY — DX: Anemia, unspecified: D64.9

## 2019-08-02 HISTORY — DX: Unspecified osteoarthritis, unspecified site: M19.90

## 2019-08-02 HISTORY — DX: Personal history of urinary calculi: Z87.442

## 2019-08-02 NOTE — Patient Instructions (Addendum)
Your procedure is scheduled on:  08-12-19 THURSDAY Report to Same Day Surgery 2nd floor medical mall [facility] Entrance-take elevator on left to 2nd floor.  Check in with surgery information desk.) To find out your arrival time please call [phone] between 1PM - 3PM on 08-11-19 [facility]  Remember: Instructions that are not followed completely may result in serious medical risk, up to and including death, or upon the discretion of your surgeon and anesthesiologist your surgery may need to be rescheduled.    _x___ 1. Do not eat food after midnight the night before your procedure. NO GUM OR CANDY AFTER MIDNIGHT. You may drink clear liquids up to 2 hours before you are scheduled to arrive at the hospital for your procedure.  Do not drink clear liquids within 2 hours of your scheduled arrival to the hospital.  Clear liquids include  --Water or Apple juice without pulp  --Gatorade  --Black Coffee or Clear Tea (No milk, no creamers, do not add anything to the coffee or Tea-ok to add sugar)      __x__ 2. No Alcohol for 24 hours before or after surgery.   __x__3. No Smoking or e-cigarettes for 24 prior to surgery.  Do not use any chewable tobacco products for at least 6 hour prior to surgery   ____  4. Bring all medications with you on the day of surgery if instructed.    __x__ 5. Notify your doctor if there is any change in your medical condition     (cold, fever, infections).    x___6. On the morning of surgery brush your teeth with toothpaste and water.  You may rinse your mouth with mouth wash if you wish.  Do not swallow any toothpaste or mouthwash.   Do not wear jewelry, make-up, hairpins, clips or nail polish.  Do not wear lotions, powders, or perfumes.   Do not shave 48 hours prior to surgery. Men may shave face and neck.  Do not bring valuables to the hospital.    [facility] is not responsible for any belongings or valuables.               Contacts, dentures or bridgework may  not be worn into surgery.  Leave your suitcase in the car. After surgery it may be brought to your room.  For patients admitted to the hospital, discharge time is determined by your treatment team.  _  Patients discharged the day of surgery will not be allowed to drive home.  You will need someone to drive you home and stay with you the night of your procedure.    Please read over the following fact sheets that you were given:   [facility] Preparing for Surgery and MRSA Information   _x___ TAKE THE FOLLOWING MEDICATION THE MORNING OF SURGERY WITH A SMALL SIP OF WATER. These include:  1. METOPROLOL (TOPROL)  2.  3.  4.  5.  6.  ____Fleets enema or Magnesium Citrate as directed.   _x___ Use CHG Soap or sage wipes as directed on instruction sheet   ____ Use inhalers on the day of surgery and bring to hospital day of surgery  ____ Stop Metformin and Janumet 2 days prior to surgery.    ____ Take 1/2 of usual insulin dose the night before surgery and none on the morning surgery.   _x___ Follow recommendations from Cardiologist, Pulmonologist or PCP regarding stopping Aspirin, Coumadin, Plavix ,Eliquis, Effient, or Pradaxa, and Pletal-CALL DR KRASINSKI'S OFFICE AND ASK  ABOUT STOPPING YOUR ASPIRIN  X____Stop Anti-inflammatories such as Advil, Aleve, Ibuprofen, Motrin, Naproxen, Naprosyn, Goodies powders or aspirin products 7 DAYS PRIOR TO SURGERY-OK to take Tylenol    _x___ Stop supplements until after surgery-STOP YOUR VITAMIN C 7 DAYS PRIOR TO SURGERY-YOU MAY RESUME AFTER YOUR SURGERY   ____ Bring C-Pap to the hospital.

## 2019-08-04 ENCOUNTER — Ambulatory Visit
Admission: RE | Admit: 2019-08-04 | Discharge: 2019-08-04 | Disposition: A | Discharge status: Home / Self Care | Payer: Managed Care, Other (non HMO) | Source: Ambulatory Visit | Attending: Orthopedic Surgery | Admitting: Orthopedic Surgery

## 2019-08-04 ENCOUNTER — Encounter
Admission: RE | Admit: 2019-08-04 | Discharge: 2019-08-04 | Disposition: A | Discharge status: Home / Self Care | Payer: Managed Care, Other (non HMO) | Source: Ambulatory Visit | Attending: Orthopedic Surgery | Admitting: Orthopedic Surgery

## 2019-08-04 ENCOUNTER — Encounter: Payer: Self-pay | Admitting: Urgent Care

## 2019-08-04 ENCOUNTER — Other Ambulatory Visit: Payer: Self-pay

## 2019-08-04 ENCOUNTER — Telehealth: Payer: Self-pay

## 2019-08-04 DIAGNOSIS — Z01811 Encounter for preprocedural respiratory examination: Secondary | ICD-10-CM | POA: Diagnosis not present

## 2019-08-04 DIAGNOSIS — Z01812 Encounter for preprocedural laboratory examination: Secondary | ICD-10-CM | POA: Diagnosis not present

## 2019-08-04 LAB — CBC WITH DIFFERENTIAL/PLATELET
Abs Immature Granulocytes: 0.03 10*3/uL (ref 0.00–0.07)
Basophils Absolute: 0 10*3/uL (ref 0.0–0.1)
Basophils Relative: 1 %
Eosinophils Absolute: 0.3 10*3/uL (ref 0.0–0.5)
Eosinophils Relative: 5 %
HCT: 43.1 % (ref 39.0–52.0)
Hemoglobin: 14.4 g/dL (ref 13.0–17.0)
Immature Granulocytes: 0 %
Lymphocytes Relative: 31 %
Lymphs Abs: 2.1 10*3/uL (ref 0.7–4.0)
MCH: 29.8 pg (ref 26.0–34.0)
MCHC: 33.4 g/dL (ref 30.0–36.0)
MCV: 89 fL (ref 80.0–100.0)
Monocytes Absolute: 0.7 10*3/uL (ref 0.1–1.0)
Monocytes Relative: 10 %
Neutro Abs: 3.8 10*3/uL (ref 1.7–7.7)
Neutrophils Relative %: 53 %
Platelets: 275 10*3/uL (ref 150–400)
RBC: 4.84 MIL/uL (ref 4.22–5.81)
RDW: 12.8 % (ref 11.5–15.5)
WBC: 7 10*3/uL (ref 4.0–10.5)
nRBC: 0 % (ref 0.0–0.2)

## 2019-08-04 LAB — BASIC METABOLIC PANEL
Anion gap: 10 (ref 5–15)
BUN: 23 mg/dL — ABNORMAL HIGH (ref 6–20)
CO2: 23 mmol/L (ref 22–32)
Calcium: 8.6 mg/dL — ABNORMAL LOW (ref 8.9–10.3)
Chloride: 105 mmol/L (ref 98–111)
Creatinine, Ser: 1.17 mg/dL (ref 0.61–1.24)
GFR calc Af Amer: >60 mL/min (ref >60)
GFR calc non Af Amer: >60 mL/min (ref >60)
Glucose, Bld: 115 mg/dL — ABNORMAL HIGH (ref 70–99)
Potassium: 4.2 mmol/L (ref 3.5–5.1)
Sodium: 138 mmol/L (ref 135–145)

## 2019-08-04 LAB — URINALYSIS, COMPLETE (UACMP) WITH MICROSCOPIC
Bacteria, UA: NONE SEEN
Bilirubin Urine: NEGATIVE
Glucose, UA: NEGATIVE mg/dL
Hgb urine dipstick: NEGATIVE
Ketones, ur: NEGATIVE mg/dL
Leukocytes,Ua: NEGATIVE
Nitrite: NEGATIVE
Protein, ur: NEGATIVE mg/dL
Specific Gravity, Urine: 1.024 (ref 1.005–1.030)
pH: 5 (ref 5.0–8.0)

## 2019-08-04 LAB — SURGICAL PCR SCREEN
MRSA, PCR: NEGATIVE
Staphylococcus aureus: POSITIVE — AB

## 2019-08-04 LAB — TYPE AND SCREEN
ABO/RH(D): O POS
Antibody Screen: NEGATIVE

## 2019-08-04 LAB — PROTIME-INR
INR: 0.9 (ref 0.8–1.2)
Prothrombin Time: 12 seconds (ref 11.4–15.2)

## 2019-08-04 LAB — APTT: aPTT: 30 seconds (ref 24–36)

## 2019-08-04 NOTE — Telephone Encounter (Signed)
Noted  

## 2019-08-04 NOTE — Progress Notes (Signed)
[facility] Perioperative Services  Pre-Admission/Anesthesia Testing Clinical Review  Date: 08/04/19  Patient Demographics:  Name: Jon Garner. DOB:   July 15, 1965 MRN:   [id number]  Planned Surgical Procedure(s):    Case: [id number] Date/Time: 08/12/19 1100   Procedure: TOTAL KNEE ARTHROPLASTY (Right Knee)   Anesthesia type: Choice   Pre-op diagnosis: Primary Osteoarthritis of right knee   Location: [facility] OR ROOM 02 / [facility] ORS FOR ANESTHESIA GROUP   Surgeons: Jon Fairly, Jon Garner     NOTE: Available PAT nursing documentation and vital signs have been reviewed. Clinical nursing staff has updated patient's PMH/PSHx, current medication list, and drug allergies/intolerances to ensure comprehensive history available to assist in medical decision making as it pertains to the aforementioned surgical procedure and anticipated anesthetic course.   Clinical Discussion:  Jon Garner. is a 54 y.o. male who is submitted for pre-surgical anesthesia review and clearance prior to him undergoing the above procedure. Patient is a Former Smoker (15 pack years; quit 12/2013). Pertinent PMH includes: CAD, NSTEMI, CABG x 4, HTN, HLD, CVA (age 48; 2/2 to PXE), Gronblad-Strandberg syndrome (PXE), anemia, GERD, perioperative A.fib/flutter/SVT.   Patient is followed by cardiology Jon Milling, Jon Garner). He was last seen in the office on 06/17/2019; notes reviewed. At that time patient was feeling well. He denies CP, SOB, palpitations, orthopnea, and dizziness. He complained only of chronic knee pain. Functional capacity > 4 METS; he was able to climb 2 flights of stairs without CP or dyspnea. Jon Garner noted that he thought it was "reasonable for him to proceed with elective knee surgery without additional cardiac testing or intervention".  If possible, low-dose aspirin should be continued in the perioperative period unless there is an excessively high risk for bleeding  He reports previous  intra-operative complications, however he is unsure if it was related to anesthesia. Patient experienced intraoperative A.fib/flutter/SVT during his CABG. He was placed on amiodarone gtt and converted to SR. Patient transitioned to oral amiodarone and discharged home on this medication. He has since been taken off of the amiodarone, which has been replaced by metoprolol tartrate.   This patient is on daily antiplatelet therapy. Per cardiology, low-dose aspirin should be continued in the perioperative period unless there is an excessively high risk for bleeding.  Vitals with BMI 06/17/2019 05/06/2019 03/17/2019  Height 5\' 8"  - 5\' 7"   Weight 299 lbs 6 oz - 291 lbs  BMI 45.53 - 45.57  Systolic 126 140  Diastolic 78 84 72  Pulse 88 98 68    Providers/Specialists:   PROVIDER ROLE LAST , [facility] (Surgeon) 07/12/2019  Jon Garner., Jon Garner Primary Care Provider 05/06/2019  End, Jon Hudson,  Jon [facility]ner [facility] 06/17/2019   Allergies:  Penicillins  Current Home Medications:   No current facility-administered medications for this encounter.   Jon Garner aspirin EC 81 MG tablet  . atorvastatin (LIPITOR) 80 MG tablet  . furosemide (LASIX) 40 MG tablet  . lisinopril (ZESTRIL) 40 MG tablet  . metoprolol tartrate (LOPRESSOR) 25 MG tablet  . nitroGLYCERIN (NITROSTAT) 0.4 MG SL tablet  . potassium chloride (KLOR-CON) 10 MEQ tablet  . vitamin C (ASCORBIC ACID) 500 MG tablet   History:   Past Medical History:  Diagnosis Date  . Anemia    h/o age 14  . Arthritis   . CAD (coronary artery disease)    a. LHC 4/19: pLAD 95%, mLAD 95%, m/dLAD 90%, ostD2 95%, pLCx 80%, mRCA 70%, dRCA 80%; b. 4-V  CABG 05/2017: LIMA-LAD, VG-D1, VG-PDA, VG-OM2  . Essential hypertension 2014  . GERD (gastroesophageal reflux disease) 2015   h/o  . History of kidney stones    h/o  . Morbid obesity (HCC) presently  . Myocardial infarction (HCC) 2019   quadruple bypass  . PXE (pseudoxanthoma  elasticum) 1992  . Snores presently  . Stroke [facility]) 1992   a. Age 67   Past Surgical History:  Procedure Laterality Date  . CORONARY ARTERY BYPASS GRAFT N/A 05/29/2017   Procedure: CORONARY ARTERY BYPASS GRAFTING (CABG)  X 4 WITH OPEN HARVESTING OF LEFT AND RIGHT SAPHENOUS VEINS;  Surgeon: Loreli Slot, Jon Garner;  Location: [facility] OR;  Service: Open Heart Surgery;  Laterality: N/A;  . KNEE SURGERY Bilateral   . LEFT HEART CATH AND CORONARY ANGIOGRAPHY N/A 05/26/2017   Procedure: LEFT HEART CATH AND CORONARY ANGIOGRAPHY;  Surgeon: Antonieta Iba, Jon Garner;  Location: [facility] INVASIVE CV LAB;  Service: Cardiovascular;  Laterality: N/A;  . TEE WITHOUT CARDIOVERSION N/A 05/29/2017   Procedure: TRANSESOPHAGEAL ECHOCARDIOGRAM (TEE);  Surgeon: Loreli Slot, Jon Garner;  Location: [facility] OR;  Service: Open Heart Surgery;  Laterality: N/A;  . TONSILLECTOMY     age 79   Family History  Problem Relation Age of Onset  . Dementia Mother   . Other Father        died in his 79's  . COPD Father   . Multiple sclerosis Sister    Social History   Tobacco Use  . Smoking status: Former Smoker    Packs/day: 1.50    Years: 10.00    Pack years: 15.00    Types: Cigarettes    Quit date: 12/05/2013    Years since quitting: 5.6  . Smokeless tobacco: Never Used  Vaping Use  . Vaping Use: Never used  Substance Use Topics  . Alcohol use: Not Currently  . Drug use: Never    Pertinent Clinical Results:   LABS: Labs reviewed: Acceptable for surgery.   Hospital Outpatient Visit on 08/04/2019  Component Date Value Ref Range Status  . MRSA, PCR 08/04/2019 NEGATIVE  NEGATIVE Final  . Staphylococcus aureus 08/04/2019 POSITIVE* NEGATIVE Final   Comment: (NOTE) The Xpert SA Assay (FDA approved for NASAL specimens in patients 98 years of age and older), is one component of a comprehensive surveillance program. It is not intended to diagnose infection nor to guide or monitor treatment. Performed at [facility], [address] [place], [place] [address]   . aPTT 08/04/2019 30  24 - 36 seconds Final   Performed at [facility], [address] [place], [place] [address]  . Sodium 08/04/2019 138  135 - 145 mmol/L Final  . Potassium 08/04/2019 4.2  3.5 - 5.1 mmol/L Final  . Chloride 08/04/2019 105  98 - 111 mmol/L Final  . CO2 08/04/2019 23  22 - 32 mmol/L Final  . Glucose, Bld 08/04/2019 115* 70 - 99 mg/dL Final   Glucose reference range applies only to samples taken after fasting for at least 8 hours.  . BUN 08/04/2019 23* 6 - 20 mg/dL Final  . Creatinine, Ser 08/04/2019 1.17  0.61 - 1.24 mg/dL Final  . Calcium 98/33/8250 8.6* 8.9 - 10.3 mg/dL Final  . GFR calc non Af Amer 08/04/2019 >60  >60 mL/min Final  . GFR calc Af Amer 08/04/2019 >60  >60 mL/min Final  . Anion gap 08/04/2019 10  5 - 15 Final   Performed at [facility], [address] [place], [place] [address]  . WBC 08/04/2019 7.0  4.0 - 10.5 K/uL Final  . RBC 08/04/2019 4.84  4.22 - 5.81 MIL/uL Final  . Hemoglobin 08/04/2019 14.4  13.0 - 17.0 g/dL Final  . HCT 41/74/0814 43.1  39 - 52 % Final  . MCV 08/04/2019 89.0  80.0 - 100.0 fL Final  . MCH 08/04/2019 29.8  26.0 - 34.0 pg Final  . MCHC 08/04/2019 33.4  30.0 - 36.0 g/dL Final  . RDW 48/18/5631 12.8  11.5 - 15.5 % Final  . Platelets 08/04/2019 275  150 - 400 K/uL Final  . nRBC 08/04/2019 0.0  0.0 - 0.2 % Final  . Neutrophils Relative % 08/04/2019 53  % Final  . Neutro Abs 08/04/2019 3.8  1.7 - 7.7 K/uL Final  . Lymphocytes Relative 08/04/2019 31  % Final  . Lymphs Abs 08/04/2019 2.1  0.7 - 4.0 K/uL Final  . Monocytes Relative 08/04/2019 10  % Final  . Monocytes Absolute 08/04/2019 0.7  0 - 1 K/uL Final  . Eosinophils Relative 08/04/2019 5  % Final  . Eosinophils Absolute 08/04/2019 0.3  0 - 0 K/uL Final  . Basophils Relative 08/04/2019 1  % Final  . Basophils Absolute 08/04/2019 0.0  0 - 0 K/uL Final  . Immature Granulocytes 08/04/2019 0  % Final    . Abs Immature Granulocytes 08/04/2019 0.03  0.00 - 0.07 K/uL Final   Performed at [facility], [address] [place], [place] [address]  . Prothrombin Time 08/04/2019 12.0  11.4 - 15.2 seconds Final  . INR 08/04/2019 0.9  0.8 - 1.2 Final   Comment: (NOTE) INR goal varies based on device and disease states. Performed at [facility], [address] [place], [place] [address]   . ABO/RH(D) 08/04/2019 O POS   Final  . Antibody Screen 08/04/2019 NEG   Final  . Sample Expiration 08/04/2019 08/18/2019,2359   Final  . Extend sample reason 08/04/2019    Final                   Value:NO TRANSFUSIONS OR PREGNANCY IN THE PAST 3 MONTHS Performed at [facility], [address] [place], [place] [address]   . Color, Urine 08/04/2019 YELLOW* YELLOW Final  . APPearance 08/04/2019 HAZY* CLEAR Final  . Specific Gravity, Urine 08/04/2019 1.024  1.005 - 1.030 Final  . pH 08/04/2019 5.0  5.0 - 8.0 Final  . Glucose, UA 08/04/2019 NEGATIVE  NEGATIVE mg/dL Final  . Hgb urine dipstick 08/04/2019 NEGATIVE  NEGATIVE Final  . Bilirubin Urine 08/04/2019 NEGATIVE  NEGATIVE Final  . Ketones, ur 08/04/2019 NEGATIVE  NEGATIVE mg/dL Final  . Protein, ur 77/41/2878 NEGATIVE  NEGATIVE mg/dL Final  . Nitrite 67/67/2094 NEGATIVE  NEGATIVE Final  . Glori Luis 08/04/2019 NEGATIVE  NEGATIVE Final  . RBC / HPF 08/04/2019 0-5  0 - 5 RBC/hpf Final  . WBC, UA 08/04/2019 0-5  0 - 5 WBC/hpf Final  . Bacteria, UA 08/04/2019 NONE SEEN  NONE SEEN Final  . Squamous Epithelial / LPF 08/04/2019 0-5  0 - 5 Final  . Mucus 08/04/2019 PRESENT   Final   Performed at [facility], [address] [place], [place] [address]   EKG: Date: 06/17/2019 Time ECG obtained: 1046 AM Rate: 88 bpm Rhythm: normal sinus with inferior Q waves Axis (leads I and aVF): Left axis deviation Intervals: PR 148 ms. QTc 440 ms. ST segment and T wave changes: No evidence  of acute ST segment elevation or  depression Comparison: Similar to previous tracing obtained on 03/17/2019  IMAGING / PROCEDURES: ECHOCARDIOGRAM done on 03/10/2019 1. Left ventricular ejection fraction, by visual estimation, is 55 to 60%. The left ventricle has normal function. There is mildly increased left ventricular hypertrophy. 2. Left ventricular diastolic parameters are consistent with Grade II diastolic dysfunction (pseudonormalization).  3. The left ventricle has no regional wall motion abnormalities.  4. Global right ventricle has normal systolic function.The right ventricular size is mildly enlarged. Right vetricular wall thickness was not assessed.  5. Left atrial size was normal.  6. Right atrial size was normal. 7. The mitral valve is grossly normal. No evidence of mitral valve regurgitation.  8. The tricuspid valve is not well visualized.  9. The tricuspid valve is not well visualized. Tricuspid valve regurgitation is not demonstrated.  10. The aortic valve is grossly normal. Aortic valve regurgitation is not visualized.  11. The pulmonic valve was not well visualized. Pulmonic valve regurgitation is not visualized.  12. AV Peak Grad:  7.6 mmHg  13. AV Mean Grad: 4.0 mmHg   LEFT HEART CATHETERIZATION done on 05/26/2017 *Note - patient underwent multivessel [facility].  Severe three vessel disease  Prox Cx lesion is 80% stenosed.  Prox LAD lesion is 95% stenosed.  Mid LAD lesion is 95% stenosed.  Ost 2nd Diag to 2nd Diag lesion is 95% stenosed.  Mid LAD to Dist LAD lesion is 90% stenosed.  Mid RCA lesion is 70% stenosed.  Dist RCA lesion is 80% stenosed.  The left ventricular ejection fraction is 55-65% by visual estimate.  LV end diastolic pressure is normal.  The left ventricular systolic function is normal.  Impression and Plan:  Jon Garner. has been referred for pre-anesthesia review and clearance prior him undergoing the planned anesthetic and  procedural courses. Available labs, pertinent testing, and imaging results were personally reviewed by me. This patient has been appropriately cleared by cardiology.   Based on clinical review performed today (08/04/19), barring and significant acute changes in the patient's overall condition, it is anticipated that he will be able to proceed with the planned surgical intervention. Any acute changes in clinical condition may necessitate his procedure being postponed and/or cancelled. Pre-surgical instructions were reviewed with the patient during his PAT appointment and questions were fielded by PAT clinical staff.  Jon Mulling, MSN, APRN, FNP-C, CEN [facility]  Peri-operative Services Nurse Practitioner Phone: [phone] 08/04/19 3:10 PM  NOTE: This note has been prepared using Dragon dictation software. Despite my best ability to proofread, there is always the potential that unintentional transcriptional errors may still occur from this process.

## 2019-08-04 NOTE — Telephone Encounter (Signed)
Copied from CRM [id number]. Topic: General - Other >> Aug 04, 2019  8:32 AM Leafy Ro wrote: Reason for CRM: Lanora Manis with [facility] is calling and the pt is schedule for surgery on 08/12/2019. Lanora Manis did not mention what type of surgery. They fax over medical clearance form and she will refax the form today

## 2019-08-04 NOTE — Telephone Encounter (Signed)
Form received

## 2019-08-04 NOTE — Progress Notes (Signed)
  [facility] Perioperative Services: Pre-Admission/Anesthesia Testing  Abnormal Lab Notification  Date: 08/04/19  Name: Jon Garner. MRN:   [id number]  Re: Abnormal labs noted during PAT appointment  Provider Notified: Juanell Fairly, MD Notification mode: Note faxed via [facility]  Labs of concern: Lab Results  Component Value Date   STAPHAUREUS POSITIVE (A) 08/04/2019   MRSAPCR NEGATIVE 08/04/2019     Quentin Mulling, MSN, APRN, FNP-C, CEN [place]  [facility]  Peri-operative Services Nurse Practitioner Phone: [phone] 08/04/19 2:44 PM

## 2019-08-05 LAB — HEMOGLOBIN A1C
Hgb A1c MFr Bld: 5.8 % — ABNORMAL HIGH (ref 4.8–5.6)
Mean Plasma Glucose: 120 mg/dL

## 2019-08-10 ENCOUNTER — Other Ambulatory Visit: Payer: Self-pay

## 2019-08-10 ENCOUNTER — Other Ambulatory Visit
Admission: RE | Admit: 2019-08-10 | Discharge: 2019-08-10 | Disposition: A | Discharge status: Home / Self Care | Payer: Managed Care, Other (non HMO) | Source: Ambulatory Visit | Attending: Orthopedic Surgery | Admitting: Orthopedic Surgery

## 2019-08-10 LAB — SARS CORONAVIRUS 2 (TAT 6-24 HRS): SARS Coronavirus 2: NEGATIVE

## 2019-08-11 IMAGING — DX DG CHEST 1V PORT
1 series · 1 of 1 positions shown · non-contrast
Comparison: May 31, 2017

CLINICAL DATA: Chest tube.

EXAM:
PORTABLE CHEST 1 VIEW

[chest]
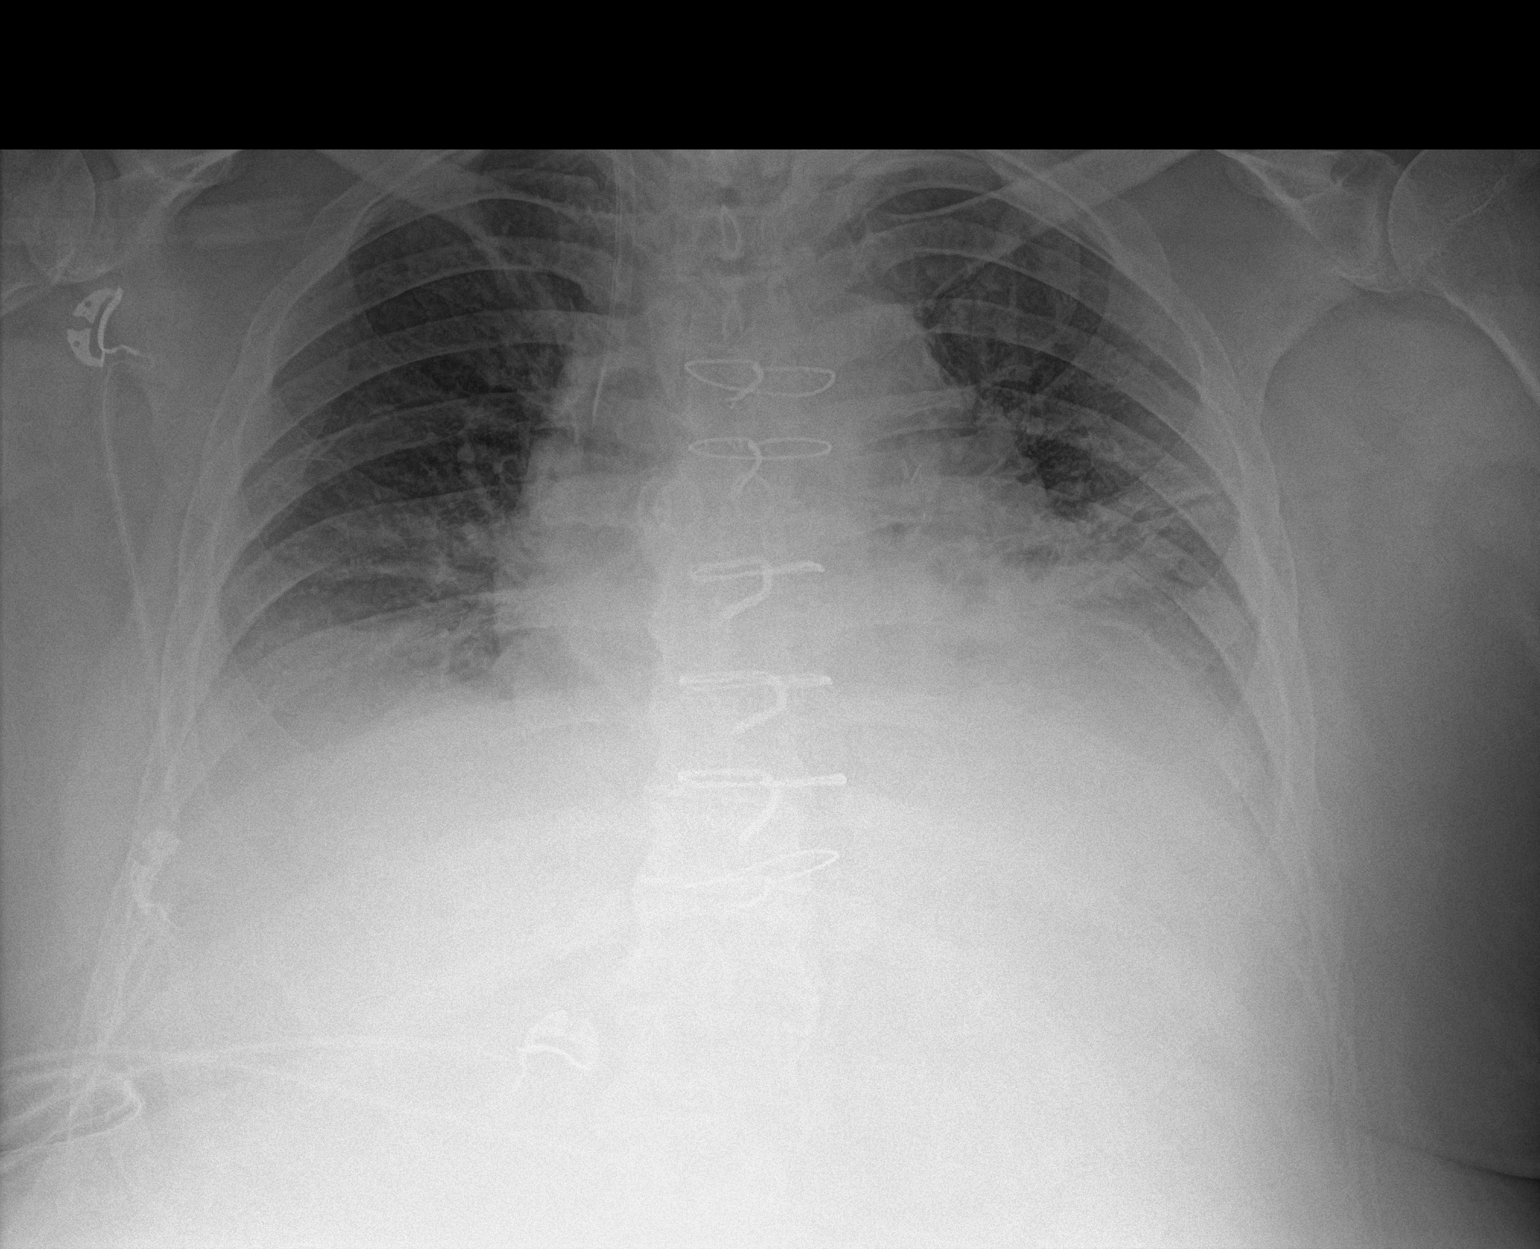

[1 of 1 positions shown; findings below may reference images not displayed]

FINDINGS: A right-sided sheath remains. Cardiomegaly. Opacity in the left base
is stable. No pneumothorax. No chest tube identified. No other
interval changes.
IMPRESSION: 1. Stable support apparatus.
2. Stable opacity in the left base. Recommend follow-up to
resolution.
3. No other acute abnormalities.

## 2019-08-11 MED ORDER — CLINDAMYCIN PHOSPHATE 600 MG/50ML IV SOLN
600.0000 mg | Freq: Once | INTRAVENOUS | Status: AC
  Administered 2019-08-12: 600 mg via INTRAVENOUS

## 2019-08-12 ENCOUNTER — Inpatient Hospital Stay: Payer: Managed Care, Other (non HMO) | Admitting: Urgent Care

## 2019-08-12 ENCOUNTER — Encounter
Admission: RE | Disposition: A | Discharge status: Home / Self Care | Payer: Self-pay | Source: Ambulatory Visit | Attending: Orthopedic Surgery

## 2019-08-12 ENCOUNTER — Inpatient Hospital Stay: Payer: Managed Care, Other (non HMO)

## 2019-08-12 ENCOUNTER — Encounter: Payer: Self-pay | Admitting: Orthopedic Surgery

## 2019-08-12 ENCOUNTER — Other Ambulatory Visit: Payer: Self-pay

## 2019-08-12 ENCOUNTER — Inpatient Hospital Stay
Admission: RE | Admit: 2019-08-12 | Discharge: 2019-08-14 | DRG: 470 | Disposition: A | Discharge status: Home Health | Payer: Managed Care, Other (non HMO) | Attending: Orthopedic Surgery | Admitting: Orthopedic Surgery

## 2019-08-12 DIAGNOSIS — K219 Gastro-esophageal reflux disease without esophagitis: Secondary | ICD-10-CM | POA: Diagnosis present

## 2019-08-12 DIAGNOSIS — Z20822 Contact with and (suspected) exposure to covid-19: Secondary | ICD-10-CM | POA: Diagnosis present

## 2019-08-12 DIAGNOSIS — Z820 Family history of epilepsy and other diseases of the nervous system: Secondary | ICD-10-CM

## 2019-08-12 DIAGNOSIS — I2510 Atherosclerotic heart disease of native coronary artery without angina pectoris: Secondary | ICD-10-CM | POA: Diagnosis present

## 2019-08-12 DIAGNOSIS — I252 Old myocardial infarction: Secondary | ICD-10-CM | POA: Diagnosis not present

## 2019-08-12 DIAGNOSIS — Z825 Family history of asthma and other chronic lower respiratory diseases: Secondary | ICD-10-CM

## 2019-08-12 DIAGNOSIS — Z880 Allergy status to penicillin: Secondary | ICD-10-CM

## 2019-08-12 DIAGNOSIS — Z951 Presence of aortocoronary bypass graft: Secondary | ICD-10-CM | POA: Diagnosis not present

## 2019-08-12 DIAGNOSIS — Z7982 Long term (current) use of aspirin: Secondary | ICD-10-CM

## 2019-08-12 DIAGNOSIS — Z79899 Other long term (current) drug therapy: Secondary | ICD-10-CM | POA: Diagnosis not present

## 2019-08-12 DIAGNOSIS — Z87891 Personal history of nicotine dependence: Secondary | ICD-10-CM | POA: Diagnosis not present

## 2019-08-12 DIAGNOSIS — I10 Essential (primary) hypertension: Secondary | ICD-10-CM | POA: Diagnosis present

## 2019-08-12 DIAGNOSIS — M1711 Unilateral primary osteoarthritis, right knee: Secondary | ICD-10-CM | POA: Diagnosis present

## 2019-08-12 DIAGNOSIS — Z8673 Personal history of transient ischemic attack (TIA), and cerebral infarction without residual deficits: Secondary | ICD-10-CM

## 2019-08-12 DIAGNOSIS — Z87442 Personal history of urinary calculi: Secondary | ICD-10-CM

## 2019-08-12 DIAGNOSIS — E785 Hyperlipidemia, unspecified: Secondary | ICD-10-CM | POA: Diagnosis present

## 2019-08-12 DIAGNOSIS — Z96651 Presence of right artificial knee joint: Secondary | ICD-10-CM

## 2019-08-12 HISTORY — PX: TOTAL KNEE ARTHROPLASTY: SHX125

## 2019-08-12 LAB — ABO/RH: ABO/RH(D): O POS

## 2019-08-12 SURGERY — ARTHROPLASTY, KNEE, TOTAL
Anesthesia: Spinal | Site: Knee | Laterality: Right

## 2019-08-12 MED ORDER — MIDAZOLAM HCL 2 MG/2ML IJ SOLN
INTRAMUSCULAR | Status: AC
  Filled 2019-08-12: qty 2

## 2019-08-12 MED ORDER — NITROGLYCERIN 0.4 MG SL SUBL: 0.4000 mg | SUBLINGUAL_TABLET | SUBLINGUAL | Status: DC | PRN

## 2019-08-12 MED ORDER — CLINDAMYCIN PHOSPHATE 600 MG/50ML IV SOLN
600.0000 mg | Freq: Four times a day (QID) | INTRAVENOUS | Status: AC
  Administered 2019-08-12 – 2019-08-13 (×2): 600 mg via INTRAVENOUS
  Filled 2019-08-12 (×2): qty 50

## 2019-08-12 MED ORDER — EPHEDRINE SULFATE 50 MG/ML IJ SOLN
INTRAMUSCULAR | Status: DC | PRN
  Administered 2019-08-12 (×3): 5 mg via INTRAVENOUS

## 2019-08-12 MED ORDER — LACTATED RINGERS IV SOLN: INTRAVENOUS | Status: DC

## 2019-08-12 MED ORDER — ONDANSETRON HCL 4 MG PO TABS: 4.0000 mg | ORAL_TABLET | Freq: Four times a day (QID) | ORAL | Status: DC | PRN

## 2019-08-12 MED ORDER — FUROSEMIDE 40 MG PO TABS: 40.0000 mg | ORAL_TABLET | Freq: Every day | ORAL | Status: DC | PRN

## 2019-08-12 MED ORDER — VANCOMYCIN HCL IN DEXTROSE 1-5 GM/200ML-% IV SOLN
1000.0000 mg | Freq: Two times a day (BID) | INTRAVENOUS | Status: AC
  Administered 2019-08-12: 1000 mg via INTRAVENOUS
  Filled 2019-08-12: qty 200

## 2019-08-12 MED ORDER — FAMOTIDINE 20 MG PO TABS
ORAL_TABLET | ORAL | Status: AC
  Administered 2019-08-12: 20 mg via ORAL
  Filled 2019-08-12: qty 1

## 2019-08-12 MED ORDER — DIPHENHYDRAMINE HCL 12.5 MG/5ML PO ELIX: 12.5000 mg | ORAL_SOLUTION | ORAL | Status: DC | PRN

## 2019-08-12 MED ORDER — CELECOXIB 200 MG PO CAPS
200.0000 mg | ORAL_CAPSULE | ORAL | Status: AC
  Administered 2019-08-13: 200 mg via ORAL
  Filled 2019-08-12: qty 1

## 2019-08-12 MED ORDER — OXYCODONE HCL 5 MG PO TABS
5.0000 mg | ORAL_TABLET | ORAL | Status: DC | PRN
  Administered 2019-08-12 – 2019-08-14 (×2): 10 mg via ORAL
  Filled 2019-08-12 (×2): qty 2

## 2019-08-12 MED ORDER — FLEET ENEMA 7-19 GM/118ML RE ENEM: 1.0000 | ENEMA | Freq: Once | RECTAL | Status: DC | PRN

## 2019-08-12 MED ORDER — ONDANSETRON HCL 4 MG/2ML IJ SOLN: 4.0000 mg | Freq: Once | INTRAMUSCULAR | Status: DC | PRN

## 2019-08-12 MED ORDER — KETOROLAC TROMETHAMINE 30 MG/ML IJ SOLN
INTRAMUSCULAR | Status: DC | PRN
  Administered 2019-08-12: 30 mg via INTRAMUSCULAR

## 2019-08-12 MED ORDER — FENTANYL CITRATE (PF) 100 MCG/2ML IJ SOLN
INTRAMUSCULAR | Status: AC
  Filled 2019-08-12: qty 2

## 2019-08-12 MED ORDER — CLINDAMYCIN PHOSPHATE 600 MG/50ML IV SOLN
INTRAVENOUS | Status: AC
  Filled 2019-08-12: qty 50

## 2019-08-12 MED ORDER — PROPOFOL 500 MG/50ML IV EMUL
INTRAVENOUS | Status: AC
  Filled 2019-08-12: qty 50

## 2019-08-12 MED ORDER — GABAPENTIN 300 MG PO CAPS
ORAL_CAPSULE | ORAL | Status: AC
  Administered 2019-08-12: 300 mg via ORAL
  Filled 2019-08-12: qty 1

## 2019-08-12 MED ORDER — ENOXAPARIN SODIUM 40 MG/0.4ML ~~LOC~~ SOLN
40.0000 mg | SUBCUTANEOUS | Status: DC
  Administered 2019-08-13 – 2019-08-14 (×2): 40 mg via SUBCUTANEOUS
  Filled 2019-08-12 (×2): qty 0.4

## 2019-08-12 MED ORDER — PROPOFOL 500 MG/50ML IV EMUL
INTRAVENOUS | Status: DC | PRN
  Administered 2019-08-12: 50 ug/kg/min via INTRAVENOUS

## 2019-08-12 MED ORDER — CHLORHEXIDINE GLUCONATE 0.12 % MT SOLN
OROMUCOSAL | Status: AC
  Administered 2019-08-12: 15 mL via OROMUCOSAL
  Filled 2019-08-12: qty 15

## 2019-08-12 MED ORDER — POTASSIUM CHLORIDE CRYS ER 10 MEQ PO TBCR: 10.0000 meq | EXTENDED_RELEASE_TABLET | Freq: Every day | ORAL | Status: DC | PRN

## 2019-08-12 MED ORDER — FENTANYL CITRATE (PF) 100 MCG/2ML IJ SOLN: 25.0000 ug | INTRAMUSCULAR | Status: DC | PRN

## 2019-08-12 MED ORDER — GABAPENTIN 300 MG PO CAPS
300.0000 mg | ORAL_CAPSULE | Freq: Three times a day (TID) | ORAL | Status: DC
  Administered 2019-08-12 – 2019-08-14 (×6): 300 mg via ORAL
  Filled 2019-08-12 (×6): qty 1

## 2019-08-12 MED ORDER — PHENYLEPHRINE HCL (PRESSORS) 10 MG/ML IV SOLN
INTRAVENOUS | Status: AC
  Filled 2019-08-12: qty 1

## 2019-08-12 MED ORDER — BISACODYL 10 MG RE SUPP: 10.0000 mg | Freq: Every day | RECTAL | Status: DC | PRN

## 2019-08-12 MED ORDER — BUPIVACAINE HCL (PF) 0.5 % IJ SOLN
INTRAMUSCULAR | Status: AC
  Filled 2019-08-12: qty 10

## 2019-08-12 MED ORDER — ASPIRIN EC 81 MG PO TBEC
81.0000 mg | DELAYED_RELEASE_TABLET | Freq: Once | ORAL | Status: AC
  Administered 2019-08-12: 81 mg via ORAL
  Filled 2019-08-12: qty 1

## 2019-08-12 MED ORDER — ASCORBIC ACID 500 MG PO TABS
500.0000 mg | ORAL_TABLET | Freq: Every day | ORAL | Status: DC
  Administered 2019-08-13 – 2019-08-14 (×2): 500 mg via ORAL
  Filled 2019-08-12 (×2): qty 1

## 2019-08-12 MED ORDER — VANCOMYCIN HCL IN DEXTROSE 1-5 GM/200ML-% IV SOLN: 1000.0000 mg | Freq: Once | INTRAVENOUS | Status: AC

## 2019-08-12 MED ORDER — CHLORHEXIDINE GLUCONATE CLOTH 2 % EX PADS: 6.0000 | MEDICATED_PAD | Freq: Once | CUTANEOUS | Status: DC

## 2019-08-12 MED ORDER — EPHEDRINE 5 MG/ML INJ
INTRAVENOUS | Status: AC
  Filled 2019-08-12: qty 10

## 2019-08-12 MED ORDER — FAMOTIDINE 20 MG PO TABS: 20.0000 mg | ORAL_TABLET | Freq: Once | ORAL | Status: AC

## 2019-08-12 MED ORDER — PROPOFOL 10 MG/ML IV BOLUS
INTRAVENOUS | Status: DC | PRN
  Administered 2019-08-12: 10 mg via INTRAVENOUS

## 2019-08-12 MED ORDER — FENTANYL CITRATE (PF) 100 MCG/2ML IJ SOLN
INTRAMUSCULAR | Status: DC | PRN
  Administered 2019-08-12 (×2): 50 ug via INTRAVENOUS

## 2019-08-12 MED ORDER — SODIUM CHLORIDE 0.9 % IV SOLN
INTRAVENOUS | Status: DC | PRN
  Administered 2019-08-12: 60 mL

## 2019-08-12 MED ORDER — TRAMADOL HCL 50 MG PO TABS
50.0000 mg | ORAL_TABLET | Freq: Four times a day (QID) | ORAL | Status: DC
  Administered 2019-08-12 – 2019-08-14 (×8): 50 mg via ORAL
  Filled 2019-08-12 (×8): qty 1

## 2019-08-12 MED ORDER — VANCOMYCIN HCL IN DEXTROSE 1-5 GM/200ML-% IV SOLN
INTRAVENOUS | Status: AC
  Administered 2019-08-12: 1000 mg via INTRAVENOUS
  Filled 2019-08-12: qty 200

## 2019-08-12 MED ORDER — ONDANSETRON HCL 4 MG/2ML IJ SOLN: 4.0000 mg | Freq: Four times a day (QID) | INTRAMUSCULAR | Status: DC | PRN

## 2019-08-12 MED ORDER — BUPIVACAINE HCL (PF) 0.5 % IJ SOLN
INTRAMUSCULAR | Status: DC | PRN
  Administered 2019-08-12: 3.2 mL

## 2019-08-12 MED ORDER — ORAL CARE MOUTH RINSE: 15.0000 mL | Freq: Once | OROMUCOSAL | Status: AC

## 2019-08-12 MED ORDER — OXYCODONE HCL 5 MG PO TABS
10.0000 mg | ORAL_TABLET | ORAL | Status: DC | PRN
  Administered 2019-08-13: 15 mg via ORAL
  Filled 2019-08-12: qty 3

## 2019-08-12 MED ORDER — CELECOXIB 200 MG PO CAPS
ORAL_CAPSULE | ORAL | Status: AC
  Administered 2019-08-12: 200 mg via ORAL
  Filled 2019-08-12: qty 1

## 2019-08-12 MED ORDER — METHOCARBAMOL 500 MG PO TABS: 500.0000 mg | ORAL_TABLET | Freq: Four times a day (QID) | ORAL | Status: DC | PRN

## 2019-08-12 MED ORDER — ACETAMINOPHEN 500 MG PO TABS: 1000.0000 mg | ORAL_TABLET | ORAL | Status: AC

## 2019-08-12 MED ORDER — NEOMYCIN-POLYMYXIN B GU 40-200000 IR SOLN
Status: DC | PRN
  Administered 2019-08-12: 16 mL

## 2019-08-12 MED ORDER — DOCUSATE SODIUM 100 MG PO CAPS
100.0000 mg | ORAL_CAPSULE | Freq: Two times a day (BID) | ORAL | Status: DC
  Administered 2019-08-12 – 2019-08-14 (×4): 100 mg via ORAL
  Filled 2019-08-12 (×4): qty 1

## 2019-08-12 MED ORDER — GABAPENTIN 300 MG PO CAPS: 300.0000 mg | ORAL_CAPSULE | ORAL | Status: AC

## 2019-08-12 MED ORDER — CEFAZOLIN SODIUM-DEXTROSE 2-4 GM/100ML-% IV SOLN: 2.0000 g | INTRAVENOUS | Status: DC

## 2019-08-12 MED ORDER — SODIUM CHLORIDE 0.9 % IV SOLN: INTRAVENOUS | Status: DC

## 2019-08-12 MED ORDER — ACETAMINOPHEN 500 MG PO TABS
1000.0000 mg | ORAL_TABLET | Freq: Four times a day (QID) | ORAL | Status: AC
  Administered 2019-08-12 – 2019-08-13 (×4): 1000 mg via ORAL
  Filled 2019-08-12 (×4): qty 2

## 2019-08-12 MED ORDER — SODIUM CHLORIDE 0.9 % IV SOLN
INTRAVENOUS | Status: DC | PRN
  Administered 2019-08-12: 25 ug/min via INTRAVENOUS

## 2019-08-12 MED ORDER — SENNOSIDES-DOCUSATE SODIUM 8.6-50 MG PO TABS: 1.0000 | ORAL_TABLET | Freq: Every evening | ORAL | Status: DC | PRN

## 2019-08-12 MED ORDER — BUPIVACAINE-EPINEPHRINE 0.25% -1:200000 IJ SOLN
INTRAMUSCULAR | Status: DC | PRN
  Administered 2019-08-12: 120 mL

## 2019-08-12 MED ORDER — METHOCARBAMOL 1000 MG/10ML IJ SOLN
500.0000 mg | Freq: Four times a day (QID) | INTRAVENOUS | Status: DC | PRN
  Filled 2019-08-12: qty 5

## 2019-08-12 MED ORDER — MORPHINE SULFATE 4 MG/ML IJ SOLN
INTRAMUSCULAR | Status: DC | PRN
  Administered 2019-08-12: 4 mg via INTRAMUSCULAR

## 2019-08-12 MED ORDER — HYDROMORPHONE HCL 1 MG/ML IJ SOLN
0.5000 mg | INTRAMUSCULAR | Status: DC | PRN
  Administered 2019-08-14: 1 mg via INTRAVENOUS
  Filled 2019-08-12: qty 1

## 2019-08-12 MED ORDER — METOPROLOL TARTRATE 25 MG PO TABS
25.0000 mg | ORAL_TABLET | Freq: Two times a day (BID) | ORAL | Status: DC
  Administered 2019-08-12 – 2019-08-14 (×4): 25 mg via ORAL
  Filled 2019-08-12 (×4): qty 1

## 2019-08-12 MED ORDER — ACETAMINOPHEN 325 MG PO TABS
325.0000 mg | ORAL_TABLET | Freq: Four times a day (QID) | ORAL | Status: DC | PRN
  Administered 2019-08-13: 650 mg via ORAL

## 2019-08-12 MED ORDER — ATORVASTATIN CALCIUM 80 MG PO TABS
80.0000 mg | ORAL_TABLET | Freq: Every day | ORAL | Status: DC
  Administered 2019-08-12 – 2019-08-13 (×2): 80 mg via ORAL
  Filled 2019-08-12 (×3): qty 1
  Filled 2019-08-12 (×2): qty 4

## 2019-08-12 MED ORDER — CHLORHEXIDINE GLUCONATE 0.12 % MT SOLN: 15.0000 mL | Freq: Once | OROMUCOSAL | Status: AC

## 2019-08-12 MED ORDER — GLYCOPYRROLATE 0.2 MG/ML IJ SOLN
INTRAMUSCULAR | Status: AC
  Filled 2019-08-12: qty 1

## 2019-08-12 MED ORDER — CELECOXIB 200 MG PO CAPS: 200.0000 mg | ORAL_CAPSULE | ORAL | Status: AC

## 2019-08-12 MED ORDER — LISINOPRIL 20 MG PO TABS
40.0000 mg | ORAL_TABLET | ORAL | Status: DC
  Administered 2019-08-14: 40 mg via ORAL
  Filled 2019-08-12 (×2): qty 2

## 2019-08-12 MED ORDER — KETOROLAC TROMETHAMINE 30 MG/ML IJ SOLN
INTRAMUSCULAR | Status: AC
  Filled 2019-08-12: qty 1

## 2019-08-12 MED ORDER — MIDAZOLAM HCL 5 MG/5ML IJ SOLN
INTRAMUSCULAR | Status: DC | PRN
  Administered 2019-08-12: 2 mg via INTRAVENOUS

## 2019-08-12 MED ORDER — ACETAMINOPHEN 500 MG PO TABS
ORAL_TABLET | ORAL | Status: AC
  Administered 2019-08-12: 1000 mg via ORAL
  Filled 2019-08-12: qty 2

## 2019-08-12 SURGICAL SUPPLY — 71 items
BLADE SAW 90X13X1.19 OSCILLAT (BLADE) ×3 IMPLANT
BLADE SAW 90X25X1.19 OSCILLAT (BLADE) ×3 IMPLANT
CANISTER SUCT 3000ML PPV (MISCELLANEOUS) ×3 IMPLANT
CEMENT HV SMART SET (Cement) ×6 IMPLANT
CEMENT TIBIA MBT SIZE 4 (Knees) ×1 IMPLANT
CNTNR SPEC 2.5X3XGRAD LEK (MISCELLANEOUS) ×1
CONT SPEC 4OZ STER OR WHT (MISCELLANEOUS) ×2
CONTAINER SPEC 2.5X3XGRAD LEK (MISCELLANEOUS) ×1 IMPLANT
COOLER POLAR GLACIER W/PUMP (MISCELLANEOUS) ×3 IMPLANT
COVER WAND RF STERILE (DRAPES) ×3 IMPLANT
CUFF TOURN SGL QUICK 24 (TOURNIQUET CUFF)
CUFF TOURN SGL QUICK 30 (TOURNIQUET CUFF)
CUFF TOURN SGL QUICK 34 (TOURNIQUET CUFF) ×2
CUFF TRNQT CYL 24X4X16.5-23 (TOURNIQUET CUFF) IMPLANT
CUFF TRNQT CYL 30X4X21-28X (TOURNIQUET CUFF) IMPLANT
CUFF TRNQT CYL 34X4.125X (TOURNIQUET CUFF) ×1 IMPLANT
DRAPE 3/4 80X56 (DRAPES) ×6 IMPLANT
DRAPE IMP U-DRAPE 54X76 (DRAPES) ×6 IMPLANT
DRAPE INCISE IOBAN 66X60 STRL (DRAPES) ×3 IMPLANT
DRAPE SURG 17X11 SM STRL (DRAPES) ×6 IMPLANT
DRSG OPSITE POSTOP 4X12 (GAUZE/BANDAGES/DRESSINGS) ×3 IMPLANT
DRSG OPSITE POSTOP 4X14 (GAUZE/BANDAGES/DRESSINGS) IMPLANT
DURAPREP 26ML APPLICATOR (WOUND CARE) ×9 IMPLANT
ELECT REM PT RETURN 9FT ADLT (ELECTROSURGICAL) ×3
ELECTRODE REM PT RTRN 9FT ADLT (ELECTROSURGICAL) ×1 IMPLANT
FEMUR SIGMA PS SZ 4.0 R (Femur) ×3 IMPLANT
GAUZE SPONGE 4X4 12PLY STRL (GAUZE/BANDAGES/DRESSINGS) ×3 IMPLANT
GLOVE BIO SURGEON STRL SZ7.5 (GLOVE) ×6 IMPLANT
GLOVE BIOGEL PI IND STRL 8 (GLOVE) ×1 IMPLANT
GLOVE BIOGEL PI IND STRL 9 (GLOVE) ×1 IMPLANT
GLOVE BIOGEL PI INDICATOR 8 (GLOVE) ×2
GLOVE BIOGEL PI INDICATOR 9 (GLOVE) ×2
GLOVE SURG 9.0 ORTHO LTXF (GLOVE) ×6 IMPLANT
GOWN STRL REUS TWL 2XL XL LVL4 (GOWN DISPOSABLE) ×3 IMPLANT
GOWN STRL REUS W/ TWL LRG LVL3 (GOWN DISPOSABLE) ×1 IMPLANT
GOWN STRL REUS W/ TWL LRG LVL4 (GOWN DISPOSABLE) ×1 IMPLANT
GOWN STRL REUS W/TWL LRG LVL3 (GOWN DISPOSABLE) ×2
GOWN STRL REUS W/TWL LRG LVL4 (GOWN DISPOSABLE) ×2
HOLDER FOLEY CATH W/STRAP (MISCELLANEOUS) ×3 IMPLANT
IMMBOLIZER KNEE 19 BLUE UNIV (SOFTGOODS) ×3 IMPLANT
KIT TURNOVER KIT A (KITS) ×3 IMPLANT
MANIFOLD NEPTUNE II (INSTRUMENTS) IMPLANT
NDL SAFETY ECLIPSE 18X1.5 (NEEDLE) ×1 IMPLANT
NEEDLE HYPO 18GX1.5 SHARP (NEEDLE) ×2
NEEDLE HYPO 22GX1.5 SAFETY (NEEDLE) ×3 IMPLANT
NEEDLE SPNL 20GX3.5 QUINCKE YW (NEEDLE) ×3 IMPLANT
NS IRRIG 1000ML POUR BTL (IV SOLUTION) ×3 IMPLANT
PACK TOTAL KNEE (MISCELLANEOUS) ×3 IMPLANT
PAD WRAPON POLOR MULTI XL (MISCELLANEOUS) ×1 IMPLANT
PATELLA DOME PFC 38MM (Knees) ×3 IMPLANT
PENCIL SMOKE ULTRAEVAC 22 CON (MISCELLANEOUS) ×3 IMPLANT
PLATE ROT INSERT 10MM SIZE 4 (Plate) ×3 IMPLANT
PULSAVAC PLUS IRRIG FAN TIP (DISPOSABLE) ×3
SOL .9 NS 3000ML IRR  AL (IV SOLUTION) ×2
SOL .9 NS 3000ML IRR UROMATIC (IV SOLUTION) ×1 IMPLANT
SPONGE LAP 18X18 RF (DISPOSABLE) IMPLANT
STAPLER SKIN PROX 35W (STAPLE) ×3 IMPLANT
SUCTION FRAZIER HANDLE 10FR (MISCELLANEOUS) ×2
SUCTION TUBE FRAZIER 10FR DISP (MISCELLANEOUS) ×1 IMPLANT
SUT ETHIBOND NAB CT1 #1 30IN (SUTURE) ×6 IMPLANT
SUT VIC AB 0 CT1 36 (SUTURE) ×3 IMPLANT
SUT VIC AB 2-0 CT1 (SUTURE) ×6 IMPLANT
SYR 20ML LL LF (SYRINGE) ×3 IMPLANT
SYR 30ML LL (SYRINGE) ×6 IMPLANT
TIBIA MBT CEMENT SIZE 4 (Knees) ×3 IMPLANT
TIP FAN IRRIG PULSAVAC PLUS (DISPOSABLE) ×1 IMPLANT
TOWER CARTRIDGE SMART MIX (DISPOSABLE) ×3 IMPLANT
TRAY FOLEY MTR SLVR 16FR STAT (SET/KITS/TRAYS/PACK) ×3 IMPLANT
TUBE SUCT KAM VAC (TUBING) ×3 IMPLANT
WRAP-ON POLOR PAD MULTI XL (MISCELLANEOUS) ×1
WRAPON POLOR PAD MULTI XL (MISCELLANEOUS) ×2

## 2019-08-12 NOTE — Anesthesia Procedure Notes (Signed)
Spinal  Patient location during procedure: OR Start time: 08/12/2019 11:28 AM End time: 08/12/2019 11:32 AM Staffing Performed: anesthesiologist  Anesthesiologist: Alvin Critchley, MD Preanesthetic Checklist Completed: patient identified, IV checked, site marked, risks and benefits discussed, surgical consent, monitors and equipment checked, pre-op evaluation and timeout performed Spinal Block Patient position: sitting Prep: Betadine Patient monitoring: heart rate, continuous pulse ox, blood pressure and cardiac monitor Approach: midline Location: L3-4 Injection technique: single-shot Needle Needle type: Whitacre and Introducer  Needle gauge: 24 G Needle length: 9 cm Additional Notes Negative paresthesia. Negative blood return. Positive free-flowing CSF. Expiration date of kit checked and confirmed. Patient tolerated procedure well, without complications.

## 2019-08-12 NOTE — Op Note (Signed)
DATE OF SURGERY:  08/12/2019 TIME: 2:50 PM  PATIENT NAME:  Jon Garner.   AGE: 54 y.o.    PRE-OPERATIVE DIAGNOSIS:  Primary Osteoarthritis of right knee  POST-OPERATIVE DIAGNOSIS:  Same  PROCEDURE:  Procedure(s): RIGHT TOTAL KNEE ARTHROPLASTY  SURGEON:  Juanell Fairly, MD   ASSISTANT:  Jennet Maduro, PA  ANESTHESIA:  Spinal and local  OPERATIVE IMPLANTS: Depuy PFC Sigma, Posterior Stabilized Femural component size 4, Tibia size rotating platform component size 4, Patella polyethylene 3-peg oval button size 38, with a 10 mm polyethylene insert.  EBL:  50 cc  TOURNIQUET TIME: 127 minutes  PREOPERATIVE INDICATIONS:  Jon Garner. is an 54 y.o. male who has a diagnosis of  Primary Osteoarthritis of right knee and elected for a right total knee arthroplasty after failing nonoperative treatment.  Their knee pain significantly impacts their activity of daily living.  Radiographs have demonstrated tricompartmental osteoarthritis joint space narrowing, osteophytes, and subchondral sclerosis.  The risks, benefits, and alternatives were discussed at length including but not limited to the risks of infection, bleeding, nerve or blood vessel injury, knee stiffness, fracture, dislocation, loosening or failure of the hardware and the need for further surgery. Medical risks include but not limited to DVT and pulmonary embolism, myocardial infarction, stroke, pneumonia, respiratory failure and death. I discussed these risks with the patient in my office prior to the date of surgery. They understood these risks and were willing to proceed.  OPERATIVE FINDINGS AND UNIQUE ASPECTS OF THE CASE: Right knee tricompartmental osteoarthritis  OPERATIVE DESCRIPTION:  The patient was brought to the operative room and placed in a supine position after undergoing placement of a spinal anesthetic.  A Foley catheter was placed.  IV antibiotics were given. Patient received vancomycin 1 g IV and  clindamycin 900mg  IV. The lower extremity was prepped and draped in the usual sterile fashion.  A time out was performed to verify the patient's name, date of birth, medical record number, correct site of surgery and correct procedure to be performed. The timeout was also used to confirm the patient received antibiotics and that appropriate instruments, implants and radiographs studies were available in the room.  The leg was elevated and exsanguinated with an Esmarch and the tourniquet was inflated to 275 mmHg for 127 minutes..  A midline incision was made over the right knee. Full-thickness skin flaps were developed. A medial parapatellar arthrotomy was then made and the patella everted and the knee was brought into 90 of flexion. Hoffa's fat pad along with the cruciate ligaments and medial and lateral menisci were resected.   The distal femoral intramedullary canal was opened with a drill and the intramedullary distal femoral cutting jig was inserted into the femoral canal pinned into position. It was set at 5 degrees resecting 10 mm off the distal femur.  Care was taken to protect the collateral ligaments during distal femoral resection.  The distal femoral resection was performed with an oscillating saw. The femoral cutting guide was then removed.  The extramedullary tibial cutting guide was then placed using the anterior tibial crest and second ray of the foot as a references.  The tibial cutting guide was adjusted to allow for appropriate posterior slope.  The tibial cutting block was pinned into position. The slotted stylus was used to measure the proximal tibial resection of 10 mm off the high lateral side.  The tibial long rod alignment guide was then used to confirm position of the cutting block. A third  cross pin through the tibial cutting block was then drilled into position to allow for rotational stability. Care was taken during the tibial resection to protect the medial and collateral  ligaments.  The resected tibial bone was removed along with the posterior horns of the menisci.  The PCL was sacrificed.  Extension gap was measured with a spacer block and alignment and extension was confirmed using a long alignment rod.  The attention was then turned back to the femur. The posterior referencing distal femoral sizing guide was applied to the distal femur.  The femur was sized to be a size 4. Rotation of the referencing guide was checked with the epicondylar axis and Whitesides line. Then the 4-in-1 cutting jig was then applied to the distal femur. A stylus was used to confirm that the anterior femur would not be notched.  Then the anterior, posterior and chamfer femoral cuts were then made with an oscillating saw.  The flexion gap was then measured with a flexion spacer block and long alignment rod and was found to be symmetric with the extension gap and perpendicular to mechanical axis of the tibia.  The distal femoral preparation was completed by performing the posterior stabilized box cut using the cutting block. The entry site for the intramedullary femoral guide was filled with autologous bone graft from bone previously resected earlier in the case.  The proximal tibia plateau was then sized with trial trays. The best coverage was achieved with a size 4. This tibial tray was then pinned into position. The proximal tibia was then prepared with the reamer and keel punch.  After tibial preparation was completed, all trial components were inserted with polyethylene trials.  The knee was found to have excellent balance and full motion with a size 10 mm tibial polyethylene insert..    The attention was then turned to preparation of the patella. The thickness of the patella was measured with a caliper, the diameter measured with the patella templates to be a 38 mm.  The patella resection was then made with an oscillating saw using the patella cutting guide.    3 peg holes for the patella  component were then drilled. The trial patella was then placed. Knee was taken through a full range of motion and deemed to be stable with the trial components. All trial components were then removed. The knee capsule was then injected with Exparel.  The knee joint capsule was injected with a mixture of quarter percent Marcaine, Toradol and morphine to assist with postoperative pain relief.  The joint was copiously irrigated with pulse lavage.  The final total knee arthroplasty components were then cemented into place with a 10 mm trial polyethylene insert and all excess methylmethacrylate was removed.  The joint was again copiously irrigated. After the cement had hardened the knee was again taken through a full range of motion. It was felt to be most stable with the 10 mm tibial polyethylene insert. The actual tibial polyethylene insert was then placed.   The knee was taken through a range of motion and the patella tracked well and the knee was again irrigated copiously.    The medial arthrotomy was closed with #1 Ethibond. The subcutaneous tissue closed with 0 and 2-0 vicryl, and skin approximated with staples.  A dry sterile and compressive dressing was applied.  A Polar Care was applied to the operative knee along with a knee immobilizer.  The patient was awakened and brought to the PACU in stable and satisfactory  condition.  All sharp, lap and instrument counts were correct at the conclusion the case.  Attempts were made from the PACU to reach family members unsuccessfully to let them know that the case was performed successfully and without complication.

## 2019-08-12 NOTE — Transfer of Care (Signed)
Immediate Anesthesia Transfer of Care Note  Patient: Zadrian Mccauley.  Procedure(s) Performed: TOTAL KNEE ARTHROPLASTY (Right Knee)  Patient Location: PACU  Anesthesia Type:MAC and Regional  Level of Consciousness: sedated  Airway & Oxygen Therapy: Patient Spontanous Breathing and Patient connected to face mask oxygen  Post-op Assessment: Report given to RN and Post -op Vital signs reviewed and stable  Post vital signs: Reviewed and stable  Last Vitals:  Vitals Value Taken Time  BP 107/55 08/12/19 1443  Temp    Pulse 74 08/12/19 1445  Resp 0 08/12/19 1445  SpO2 100 % 08/12/19 1445  Vitals shown include unvalidated device data.  Last Pain:  Vitals:   08/12/19 1011  PainSc: 2          Complications: No complications documented.

## 2019-08-12 NOTE — Evaluation (Signed)
Physical Therapy Evaluation Patient Details Name: Jon Garner. MRN: [id number] DOB: 01-13-1966 Today's Date: 08/12/2019   History of Present Illness  54 y/o male s/p R TKA 7/8.  Clinical Impression  Pt did very well with first PT session POD0.  He was able to get to sitting EOB w/o assist or use of rails, similarly he was able to rise to standing w/o assist and with minimal hesitation.  He was able to do SLRs confidently showing good quad control and had AROM flexion to ~80 (3-87 PROM after multiple reps).  He reports modest pain with no increase during WBing.  Overall pt at or beyond expected POD0 expectations.     Follow Up Recommendations Home health PT;Supervision - Intermittent    Equipment Recommendations  Rolling walker with 5" wheels (bariatric, may need 3-in-1 per progress)    Recommendations for Other Services       Precautions / Restrictions Precautions Precautions: Knee;Fall Required Braces or Orthoses: Knee Immobilizer - Right Restrictions Weight Bearing Restrictions: Yes RLE Weight Bearing: Weight bearing as tolerated      Mobility  Bed Mobility Overal bed mobility: Modified Independent             General bed mobility comments: pt able to get to EOB w/o assist, w/o use of rails  Transfers Overall transfer level: Modified independent Equipment used: Rolling walker (2 wheeled)             General transfer comment: minimal cuing for set up, hand placement, sequencing, no assist needed  Ambulation/Gait Ambulation/Gait assistance: Supervision Gait Distance (Feet): 65 Feet Assistive device: Rolling walker (2 wheeled)       General Gait Details: Pt was able to ambulate with consistent and confident cadence, no limp or excessive UE reliance on walker and good safety.  Vitals stable and appropriate.  Stairs            Wheelchair Mobility    Modified Rankin (Stroke Patients Only)       Balance Overall balance assessment: Modified  Independent                                           Pertinent Vitals/Pain Pain Assessment: 0-10 Pain Score: 4  Pain Location: R knee/thigh    Home Living Family/patient expects to be discharged to:: Private residence Living Arrangements: Other relatives Available Help at Discharge: Family Type of Home: House (split level) Home Access: Stairs to enter Entrance Stairs-Rails: Can reach both Entrance Stairs-Number of Steps: 2 Home Layout: Multi-level Home Equipment: None      Prior Function Level of Independence: Independent         Comments: pt works Medical sales representative job), 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, drive, Psychiatric nurse        Extremity/Trunk Assessment   Upper Extremity Assessment Upper Extremity Assessment: Overall WFL for tasks assessed    Lower Extremity Assessment Lower Extremity Assessment: Overall WFL for tasks assessed (expected R post-op weakness, able to SLR w/o warm up)       Communication   Communication: No difficulties  Cognition Arousal/Alertness: Awake/alert Behavior During Therapy: WFL for tasks assessed/performed Overall Cognitive Status: Within Functional Limits for tasks assessed  General Comments      Exercises Total Joint Exercises Quad Sets: Strengthening;10 reps Heel Slides: Strengthening;10 reps (with lightly resisted leg extensions) Hip ABduction/ADduction: Strengthening;10 reps Straight Leg Raises: AROM;10 reps Knee Flexion: PROM;5 reps Goniometric ROM: 3-87 (~80 deg AROM w/o warm up)   Assessment/Plan    PT Assessment Patient needs continued PT services  PT Problem List Decreased strength;Decreased range of motion;Decreased activity tolerance;Decreased balance;Decreased mobility;Decreased knowledge of use of DME;Pain       PT Treatment Interventions DME instruction;Gait training;Stair training;Functional mobility training;Therapeutic activities;Therapeutic  exercise;Balance training;Neuromuscular re-education;Patient/family education    PT Goals (Current goals can be found in the Care Plan section)  Acute Rehab PT Goals Patient Stated Goal: do well so he can get the L knee replaced PT Goal Formulation: With patient Time For Goal Achievement: 08/26/19 Potential to Achieve Goals: Good    Frequency BID   Barriers to discharge        Co-evaluation               AM-PAC PT "6 Clicks" Mobility  Outcome Measure Help needed turning from your back to your side while in a flat bed without using bedrails?: None Help needed moving from lying on your back to sitting on the side of a flat bed without using bedrails?: None Help needed moving to and from a bed to a chair (including a wheelchair)?: None Help needed standing up from a chair using your arms (e.g., wheelchair or bedside chair)?: None Help needed to walk in hospital room?: None Help needed climbing 3-5 steps with a railing? : A Little 6 Click Score: 23    End of Session Equipment Utilized During Treatment: Gait belt;Right knee immobilizer Activity Tolerance: Patient tolerated treatment well Patient left: in chair;with call bell/phone within reach;with nursing/sitter in room;with family/visitor present Nurse Communication: Mobility status PT Visit Diagnosis: Muscle weakness (generalized) (M62.81);Difficulty in walking, not elsewhere classified (R26.2);Pain Pain - Right/Left: Right Pain - part of body: Knee    Time: 1701-1738 PT Time Calculation (min) (ACUTE ONLY): 37 min   Charges:   PT Evaluation $PT Eval Low Complexity: 1 Low PT Treatments $Gait Training: 8-22 mins $Therapeutic Exercise: 8-22 mins        Malachi Pro, DPT 08/12/2019, 6:16 PM

## 2019-08-12 NOTE — H&P (Signed)
PREOPERATIVE H&P  Chief Complaint: Primary Osteoarthritis of right knee  HPI: Jon Garner. is a 54 y.o. male who presents for preoperative history and physical with a diagnosis of Primary Osteoarthritis of right knee. Symptoms of pain, swelling and limited range of motion are significantly impairing activities of daily living.  Patient's x-rays show significant varus deformity to his right knee with bone-on-bone contact in the medial compartment.  Patient has marginal osteophytes and sclerosis as well.  Patient has failed nonoperative management and has elected to proceed with right total knee arthroplasty.    Past Medical History:  Diagnosis Date  . Anemia    h/o age 56  . Arthritis   . CAD (coronary artery disease)    a. LHC 4/19: pLAD 95%, mLAD 95%, m/dLAD 90%, ostD2 95%, pLCx 80%, mRCA 70%, dRCA 80%; b. 4-V CABG 05/2017: LIMA-LAD, VG-D1, VG-PDA, VG-OM2  . Essential hypertension 2014  . GERD (gastroesophageal reflux disease) 2015   h/o  . History of kidney stones    h/o  . Morbid obesity (HCC) presently  . Myocardial infarction (HCC) 2019   quadruple bypass  . PXE (pseudoxanthoma elasticum) 1992  . Snores presently  . Stroke [facility]) 1992   a. Age 62   Past Surgical History:  Procedure Laterality Date  . CORONARY ARTERY BYPASS GRAFT N/A 05/29/2017   Procedure: CORONARY ARTERY BYPASS GRAFTING (CABG)  X 4 WITH OPEN HARVESTING OF LEFT AND RIGHT SAPHENOUS VEINS;  Surgeon: Loreli Slot, MD;  Location: [facility] OR;  Service: Open Heart Surgery;  Laterality: N/A;  . KNEE SURGERY Bilateral   . LEFT HEART CATH AND CORONARY ANGIOGRAPHY N/A 05/26/2017   Procedure: LEFT HEART CATH AND CORONARY ANGIOGRAPHY;  Surgeon: Antonieta Iba, MD;  Location: [facility] INVASIVE CV LAB;  Service: Cardiovascular;  Laterality: N/A;  . TEE WITHOUT CARDIOVERSION N/A 05/29/2017   Procedure: TRANSESOPHAGEAL ECHOCARDIOGRAM (TEE);  Surgeon: Loreli Slot, MD;  Location: [facility] OR;  Service: Open Heart  Surgery;  Laterality: N/A;  . TONSILLECTOMY     age 1   Social History   Socioeconomic History  . Marital status: Single    Spouse name: Not on file  . Number of children: Not on file  . Years of education: Not on file  . Highest education level: Not on file  Occupational History  . Occupation: Chef  Tobacco Use  . Smoking status: Former Smoker    Packs/day: 1.50    Years: 10.00    Pack years: 15.00    Types: Cigarettes    Quit date: 12/05/2013    Years since quitting: 5.6  . Smokeless tobacco: Never Used  Vaping Use  . Vaping Use: Never used  Substance and Sexual Activity  . Alcohol use: Not Currently  . Drug use: Never  . Sexual activity: Not on file    Comment: Not asked  Other Topics Concern  . 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  Does not routinely exercise.   Social Determinants of Health   Financial Resource Strain:   . Difficulty of Paying Living Expenses:   Food Insecurity:   . Worried About Programme researcher, broadcasting/film/video in the Last Year:   . Barista in the Last Year:   Transportation Needs:   . Freight forwarder (Medical):   Marland Kitchen Lack of Transportation (Non-Medical):   Physical Activity:   . Days of Exercise per Week:   . Minutes of Exercise per Session:  Stress:   . Feeling of Stress :   Social Connections:   . Frequency of Communication with Friends and Family:   . Frequency of Social Gatherings with Friends and Family:   . Attends Religious Services:   . Active Member of Clubs or Organizations:   . Attends Banker Meetings:   Marland Kitchen Marital Status:    Family History  Problem Relation Age of Onset  . Dementia Mother   . Other Father        died in his 33's  . COPD Father   . Multiple sclerosis Sister    Allergies  Allergen Reactions  . Penicillins Other (See Comments)    Has patient had a PCN reaction causing immediate rash, facial/tongue/throat swelling, SOB or lightheadedness with hypotension:  Unknown Has patient had a PCN reaction causing severe rash involving mucus membranes or skin necrosis: Unknown Has patient had a PCN reaction that required hospitalization: Unknown Has patient had a PCN reaction occurring within the last 10 years: Unknown If all of the above answers are "NO", then may proceed with Cephalosporin use.    Prior to Admission medications   Medication Sig Start Date End Date Taking? Authorizing Provider  aspirin EC 81 MG tablet Take 81 mg by mouth once.   Yes [provider]  atorvastatin (LIPITOR) 80 MG tablet Take 1 tablet (80 mg total) by mouth daily at 6 PM. Patient taking differently: Take 80 mg by mouth at bedtime.  06/17/19  Yes End, Cristal Deer, MD  furosemide (LASIX) 40 MG tablet Take 1 tablet (40 mg total) by mouth daily. Patient taking differently: Take 40 mg by mouth daily as needed for edema.  06/17/19  Yes End, Cristal Deer, MD  lisinopril (ZESTRIL) 40 MG tablet Take 1 tablet (40 mg total) by mouth daily. Patient taking differently: Take 40 mg by mouth every morning.  06/17/19  Yes End, Cristal Deer, MD  metoprolol tartrate (LOPRESSOR) 25 MG tablet TAKE 1 TABLET(25 MG) BY MOUTH TWICE DAILY Patient taking differently: Take 25 mg by mouth 2 (two) times daily.  12/23/18  Yes End, Cristal Deer, MD  nitroGLYCERIN (NITROSTAT) 0.4 MG SL tablet Place 1 tablet (0.4 mg total) under the tongue every 5 (five) minutes as needed for chest pain. Maximum of 3 doses. 07/22/18  Yes End, Cristal Deer, MD  potassium chloride (KLOR-CON) 10 MEQ tablet Take 1 tablet (10 mEq total) by mouth daily. Patient taking differently: Take 10 mEq by mouth as needed. Only when he takes furosemide 06/17/19  Yes End, Cristal Deer, MD  vitamin C (ASCORBIC ACID) 500 MG tablet Take 500 mg by mouth daily.    Yes [provider]     Positive ROS: All other systems have been reviewed and were otherwise negative with the exception of those mentioned in the HPI and as above.  Physical  Exam: General: Alert, no acute distress Cardiovascular: Regular rate and rhythm, no murmurs rubs or gallops.  No pedal edema Respiratory: Clear to auscultation bilaterally, no wheezes rales or rhonchi. No cyanosis, no use of accessory musculature GI: No organomegaly, abdomen is soft and non-tender nondistended with positive bowel sounds. Skin: Skin intact, no lesions within the operative field. Neurologic: Sensation intact distally Psychiatric: Patient is competent for consent with normal mood and affect Lymphatic: No cervical lymphadenopathy  MUSCULOSKELETAL: Right knee: Patient skin is intact.  There is no erythema ecchymosis but a small effusion.  Patient can actively flex and extend his knee without significant pain but has point tenderness over the medial  joint line.  There is no ligamentous laxity.  His varus deformity is partially passively correctable.  Patient has no calf tenderness.  His compartments are soft and compressible.  His palpable pedal pulses, intact/light touch and intact motor function distally.  Is patellofemoral crepitus and mild grind but no apprehension.  Assessment: Primary Osteoarthritis of right knee  Plan: Plan for Procedure(s): RIGHT TOTAL KNEE ARTHROPLASTY  Reviewed the details of the operation as well as the postoperative course with the patient.  I discussed the risks and benefits of surgery. The risks include but are not limited to infection requiring the removal of the prosthesis, bleeding requiring blood transfusion, nerve or blood vessel injury, joint stiffness or loss of motion, persistent pain, weakness or instability, loosening or hardware failure and the need for further surgery. Medical risks include but are not limited to DVT and pulmonary embolism, myocardial infarction, stroke, pneumonia, respiratory failure and death. Patient understood these risks and wished to proceed.     Juanell Fairly, MD   08/12/2019 11:03 AM

## 2019-08-12 NOTE — Anesthesia Preprocedure Evaluation (Addendum)
Anesthesia Evaluation  Patient identified by MRN, date of birth, ID band Patient awake    Reviewed: Allergy & Precautions, NPO status , Patient's Chart, lab work & pertinent test results  Airway Mallampati: II  TM Distance: >3 FB     Dental   Pulmonary former smoker,    breath sounds clear to auscultation       Cardiovascular hypertension, + CAD and + Past MI   Rhythm:Regular Rate:Normal     Neuro/Psych CVA negative psych ROS   GI/Hepatic Neg liver ROS, GERD  ,  Endo/Other  negative endocrine ROS  Renal/GU negative Renal ROS  negative genitourinary   Musculoskeletal  (+) Arthritis , Osteoarthritis,    Abdominal   Peds negative pediatric ROS (+)  Hematology  (+) anemia ,   Anesthesia Other Findings Past Medical History: No date: Anemia     Comment:  h/o age 54 No date: Arthritis No date: CAD (coronary artery disease)     Comment:  a. LHC 4/19: pLAD 95%, mLAD 95%, m/dLAD 90%, ostD2 95%,               pLCx 80%, mRCA 70%, dRCA 80%; b. 4-V CABG 05/2017:               LIMA-LAD, VG-D1, VG-PDA, VG-OM2 2014: Essential hypertension 2015: GERD (gastroesophageal reflux disease)     Comment:  h/o No date: History of kidney stones     Comment:  h/o presently: Morbid obesity (HCC) 2019: Myocardial infarction (HCC)     Comment:  quadruple bypass 1992: PXE (pseudoxanthoma elasticum) presently: Snores 1992: Stroke (HCC)     Comment:  a. Age 54  Reproductive/Obstetrics                             Anesthesia Physical  Anesthesia Plan  ASA: III  Anesthesia Plan: Spinal   Post-op Pain Management:    Induction: Intravenous  PONV Risk Score and Plan: 2 and Treatment may vary due to age or medical condition, Ondansetron, Dexamethasone and Midazolam  Airway Management Planned: Nasal Cannula  Additional Equipment:   Intra-op Plan:   Post-operative Plan:   Informed Consent: I have reviewed  the patients History and Physical, chart, labs and discussed the procedure including the risks, benefits and alternatives for the proposed anesthesia with the patient or authorized representative who has indicated his/her understanding and acceptance.     Dental advisory given  Plan Discussed with: CRNA and Surgeon  Anesthesia Plan Comments:         Anesthesia Quick Evaluation

## 2019-08-12 NOTE — Progress Notes (Signed)
  Subjective:  POST-OP CHECK s/p right total knee arthroplasty.   Patient reports right knee pain as mild.  Patient was up out of bed to a chair this evening when I saw him at approximately 7 PM.  He denied any significant pain.  Patient states he had walked out to the nurses station this evening with physical therapy.  Objective:   VITALS:   Vitals:   08/12/19 1720 08/12/19 1824 08/12/19 1948 08/12/19 2033  BP: (!) 151/63 (!) 163/82 (!) 159/75 (!) 158/82  Pulse: 76 77 79 78  Resp: 18 18 18 20   Temp: 97.8 F (36.6 C) 99.3 F (37.4 C) 97.7 F (36.5 C) 98.7 F (37.1 C)  TempSrc: Oral Oral Oral Oral  SpO2: 100% 100% 100% 100%    PHYSICAL EXAM: Right lower extremity: Neurovascular intact Sensation intact distally Intact pulses distally Dorsiflexion/Plantar flexion intact Incision: dressing C/D/I No cellulitis present Compartment soft  LABS  Results for orders placed or performed during the hospital encounter of 08/12/19 (from the past 24 hour(s))  ABO/Rh     Status: None   Collection Time: 08/12/19 10:22 AM  Result Value Ref Range   ABO/RH(D)      O POS Performed at [facility], [address], [place], [place]     DG Knee Right Port  Result Date: 08/12/2019 CLINICAL DATA:  Status post total knee replacement. EXAM: PORTABLE RIGHT KNEE - 1-2 VIEW COMPARISON:  None. FINDINGS: Right knee arthroplasty in expected alignment. There is been patellar resurfacing. No periprosthetic lucency or fracture. Recent postsurgical change includes air and edema in the soft tissues and joint space with anterior skin staples. IMPRESSION: Right knee arthroplasty without immediate postoperative complication. Electronically Signed   By: 10/13/2019 M.D.   On: 08/12/2019 15:55    Assessment/Plan: Day of Surgery   Active Problems:   S/P TKR (total knee replacement) using cement, right  Patient doing postop.  I reviewed the postoperative x-rays which demonstrate the  total knee arthroplasty components are well-positioned.  There is no evidence of postop complication.  Patient will continue his current pain management.  Labs will be drawn in the morning.  His Foley catheter will be removed tomorrow morning.  Patient will continue physical therapy tomorrow.  He will continue wearing his knee immobilizer while in bed.  He will continue his Polar Care.  Patient will complete 24 hours of postop antibiotics.  Patient will begin Lovenox for DVT prophylaxis tomorrow.    10/13/2019 , MD 08/12/2019, 9:03 PM

## 2019-08-13 ENCOUNTER — Encounter: Payer: Self-pay | Admitting: Orthopedic Surgery

## 2019-08-13 LAB — CBC
HCT: 34 % — ABNORMAL LOW (ref 39.0–52.0)
Hemoglobin: 11.6 g/dL — ABNORMAL LOW (ref 13.0–17.0)
MCH: 30.4 pg (ref 26.0–34.0)
MCHC: 34.1 g/dL (ref 30.0–36.0)
MCV: 89 fL (ref 80.0–100.0)
Platelets: 228 10*3/uL (ref 150–400)
RBC: 3.82 MIL/uL — ABNORMAL LOW (ref 4.22–5.81)
RDW: 13 % (ref 11.5–15.5)
WBC: 8.3 10*3/uL (ref 4.0–10.5)
nRBC: 0 % (ref 0.0–0.2)

## 2019-08-13 LAB — BASIC METABOLIC PANEL
Anion gap: 7 (ref 5–15)
BUN: 17 mg/dL (ref 6–20)
CO2: 24 mmol/L (ref 22–32)
Calcium: 8 mg/dL — ABNORMAL LOW (ref 8.9–10.3)
Chloride: 107 mmol/L (ref 98–111)
Creatinine, Ser: 0.98 mg/dL (ref 0.61–1.24)
GFR calc Af Amer: >60 mL/min (ref >60)
GFR calc non Af Amer: >60 mL/min (ref >60)
Glucose, Bld: 121 mg/dL — ABNORMAL HIGH (ref 70–99)
Potassium: 4.4 mmol/L (ref 3.5–5.1)
Sodium: 138 mmol/L (ref 135–145)

## 2019-08-13 MED ORDER — DOCUSATE SODIUM 100 MG PO CAPS: 100.0000 mg | ORAL_CAPSULE | Freq: Two times a day (BID) | ORAL | 0 refills | Status: DC

## 2019-08-13 MED ORDER — OXYCODONE HCL 5 MG PO TABS: 5.0000 mg | ORAL_TABLET | ORAL | 0 refills | Status: DC | PRN

## 2019-08-13 MED ORDER — ENOXAPARIN SODIUM 40 MG/0.4ML ~~LOC~~ SOLN: 40.0000 mg | SUBCUTANEOUS | 0 refills | Status: DC

## 2019-08-13 NOTE — Discharge Summary (Signed)
Physician Discharge Summary  Patient ID: Jon Garner. MRN: [id number] DOB/AGE: [birth year] 53 y.o.  Admit date: 08/12/2019 Discharge date: 08/13/2019  Admission Diagnoses:  Primary Osteoarthritis of right knee <principal problem not specified>  Discharge Diagnoses:  Primary Osteoarthritis of right knee Active Problems:   S/P TKR (total knee replacement) using cement, right   Past Medical History:  Diagnosis Date  . Anemia    h/o age 89  . Arthritis   . CAD (coronary artery disease)    a. LHC 4/19: pLAD 95%, mLAD 95%, m/dLAD 90%, ostD2 95%, pLCx 80%, mRCA 70%, dRCA 80%; b. 4-V CABG 05/2017: LIMA-LAD, VG-D1, VG-PDA, VG-OM2  . Essential hypertension 2014  . GERD (gastroesophageal reflux disease) 2015   h/o  . History of kidney stones    h/o  . Morbid obesity (HCC) presently  . Myocardial infarction (HCC) 2019   quadruple bypass  . PXE (pseudoxanthoma elasticum) 1992  . Snores presently  . Stroke [facility]) 1992   a. Age 52    Surgeries: Procedure(s): TOTAL KNEE ARTHROPLASTY on 08/12/2019   Consultants (if any):   Discharged Condition: Improved  Hospital Course: Jon Garner. is an 54 y.o. male who was admitted 08/12/2019 with a diagnosis of  Primary Osteoarthritis of right kneeand went to the operating room on 08/12/2019 and underwent an uncomplicated right total knee arthroplasty.    He was given perioperative antibiotics:  Anti-infectives (From admission, onward)   Start     Dose/Rate Route Frequency Ordered Stop   08/13/19 0000  clindamycin (CLEOCIN) IVPB 600 mg        600 mg 100 mL/hr over 30 Minutes Intravenous Every 6 hours 08/12/19 2107 08/13/19 0612   08/12/19 2200  vancomycin (VANCOCIN) IVPB 1000 mg/200 mL premix        1,000 mg 200 mL/hr over 60 Minutes Intravenous Every 12 hours 08/12/19 1550 08/12/19 2233   08/12/19 1100  vancomycin (VANCOCIN) IVPB 1000 mg/200 mL premix        1,000 mg 200 mL/hr over 60 Minutes Intravenous  Once 08/12/19 1054 08/12/19  1214   08/12/19 1016  clindamycin (CLEOCIN) 600 MG/50ML IVPB       Note to Pharmacy: Rayann Heman   : cabinet override      08/12/19 1016 08/12/19 1154   08/12/19 1015  ceFAZolin (ANCEF) IVPB 2g/100 mL premix  Status:  Discontinued        2 g 200 mL/hr over 30 Minutes Intravenous On call to O.R. 08/12/19 1013 08/12/19 1054   08/11/19 2200  clindamycin (CLEOCIN) IVPB 600 mg        600 mg 100 mL/hr over 30 Minutes Intravenous  Once 08/11/19 2145 08/12/19 1150    .  He was given sequential compression devices, early ambulation, and Lovenox for DVT prophylaxis.  Patient was admitted to the orthopedic floor postoperatively.  The night of surgery he was able to get up and walk to the nurses station with physical therapy.  Patient had serosanguineous drainage on his surgical dressing postop day #1 and this was changed by the nursing staff.  Patient's labs were within normal limits postop.  He continues to improve with physical therapy.  Given his clinical improvement he is prepared for discharge home on postop day #2.  He benefited maximally from the hospital stay and there were no complications.    Recent vital signs:  Vitals:   08/13/19 0754 08/13/19 1554  BP: (!) 108/49 133/63  Pulse: 70 90  Resp: 16  17  Temp: 98.5 F (36.9 C) 100.1 F (37.8 C)  SpO2: 97% 100%    Recent laboratory studies:  Lab Results  Component Value Date   HGB 11.6 (L) 08/13/2019   HGB 14.4 08/04/2019   HGB 14.5 02/24/2019   Lab Results  Component Value Date   WBC 8.3 08/13/2019   PLT 228 08/13/2019   Lab Results  Component Value Date   INR 0.9 08/04/2019   Lab Results  Component Value Date   NA 138 08/13/2019   K 4.4 08/13/2019   CL 107 08/13/2019   CO2 24 08/13/2019   BUN 17 08/13/2019   CREATININE 0.98 08/13/2019   GLUCOSE 121 (H) 08/13/2019    Discharge Medications:   Allergies as of 08/13/2019      Reactions   Penicillins Other (See Comments)   Has patient had a PCN reaction causing  immediate rash, facial/tongue/throat swelling, SOB or lightheadedness with hypotension: Unknown Has patient had a PCN reaction causing severe rash involving mucus membranes or skin necrosis: Unknown Has patient had a PCN reaction that required hospitalization: Unknown Has patient had a PCN reaction occurring within the last 10 years: Unknown If all of the above answers are "NO", then may proceed with Cephalosporin use.      Medication List    TAKE these medications   aspirin EC 81 MG tablet Take 81 mg by mouth once.   atorvastatin 80 MG tablet Commonly known as: LIPITOR Take 1 tablet (80 mg total) by mouth daily at 6 PM. What changed: when to take this   docusate sodium 100 MG capsule Commonly known as: COLACE Take 1 capsule (100 mg total) by mouth 2 (two) times daily.   enoxaparin 40 MG/0.4ML injection Commonly known as: LOVENOX Inject 0.4 mLs (40 mg total) into the skin daily for 14 doses. Start taking on: August 14, 2019   furosemide 40 MG tablet Commonly known as: LASIX Take 1 tablet (40 mg total) by mouth daily. What changed:   when to take this  reasons to take this   lisinopril 40 MG tablet Commonly known as: ZESTRIL Take 1 tablet (40 mg total) by mouth daily. What changed: when to take this   metoprolol tartrate 25 MG tablet Commonly known as: LOPRESSOR TAKE 1 TABLET(25 MG) BY MOUTH TWICE DAILY What changed: See the new instructions.   nitroGLYCERIN 0.4 MG SL tablet Commonly known as: Nitrostat Place 1 tablet (0.4 mg total) under the tongue every 5 (five) minutes as needed for chest pain. Maximum of 3 doses.   oxyCODONE 5 MG immediate release tablet Commonly known as: Oxy IR/ROXICODONE Take 1-2 tablets (5-10 mg total) by mouth every 4 (four) hours as needed for moderate pain (pain score 4-6).   potassium chloride 10 MEQ tablet Commonly known as: KLOR-CON Take 1 tablet (10 mEq total) by mouth daily. What changed:   when to take this  reasons to take  this  additional instructions   vitamin C 500 MG tablet Commonly known as: ASCORBIC ACID Take 500 mg by mouth daily.            Durable Medical Equipment  (From admission, onward)         Start     Ordered   08/13/19 1443  For home use only DME Walker rolling  Once       Comments: HD  Question Answer Comment  Walker: With 5 Inch Wheels   Patient needs a walker to treat with the following condition  Weakness      08/13/19 1442          Diagnostic Studies: Chest 2 View  Result Date: 08/04/2019 CLINICAL DATA:  Preoperative chest exam. Additional history provided: Preoperative chest x-ray for knee replacement. History of CAD, hypertension, stroke. Former smoker. EXAM: CHEST - 2 VIEW COMPARISON:  Prior chest radiographs 07/01/2017 and earlier FINDINGS: Prior median sternotomy and CABG. Heart size at the upper limits of normal, unchanged. There is no appreciable airspace consolidation. No frank pulmonary edema. No evidence of pleural effusion or pneumothorax. No acute bony abnormality is identified. IMPRESSION: No evidence of acute cardiopulmonary abnormality. Prior median sternotomy and CABG. Heart size at the upper limits of normal, unchanged. Electronically Signed   By: Jackey Loge DO   On: 08/04/2019 15:11   DG Knee Right Port  Result Date: 08/12/2019 CLINICAL DATA:  Status post total knee replacement. EXAM: PORTABLE RIGHT KNEE - 1-2 VIEW COMPARISON:  None. FINDINGS: Right knee arthroplasty in expected alignment. There is been patellar resurfacing. No periprosthetic lucency or fracture. Recent postsurgical change includes air and edema in the soft tissues and joint space with anterior skin staples. IMPRESSION: Right knee arthroplasty without immediate postoperative complication. Electronically Signed   By: Narda Rutherford M.D.   On: 08/12/2019 15:55    Disposition: Discharge disposition: 01-Home or Self Care       Discharge Instructions    Call MD / Call 911   Complete  by: As directed    If you experience chest pain or shortness of breath, CALL 911 and be transported to the hospital emergency room.  If you develope a fever above 101 F, pus (white drainage) or increased drainage or redness at the wound, or calf pain, call your surgeon's office.   Constipation Prevention   Complete by: As directed    Drink plenty of fluids.  Prune juice may be helpful.  You may use a stool softener, such as Colace (over the counter) 100 mg twice a day.  Use MiraLax (over the counter) for constipation as needed.   Diet general   Complete by: As directed    Discharge instructions   Complete by: As directed    The patient may continue to bear weight on the right lower extremity with use of a walker. The patient should continue to use TED stockings until follow-up. Patient should remove the TED stockings at night for sleep. The patient needs to continue to elevate the right lower extremity whenever possible. The knee immobilizer should be used at night. The patient may remove the knee immobilizer to perform exercises or sit in a chair during the day.  Patient should not place a pillow under their knee. The Polar Care may be used by the patient for comfort.  The dressing should remain on until follow up in the office.   The patient must cover the right knee dressing/incision during showers with a plastic bag or Saran wrap.  The patient will take lovenox 40 mg injection once a day for blood clot prevention and continue to work on knee range of motion exercises at home as instructed by physical therapy until follow-up in the office.   Driving restrictions   Complete by: As directed    No driving for 7-74 weeks   Increase activity slowly as tolerated   Complete by: As directed    Lifting restrictions   Complete by: As directed    No lifting for 12-16 weeks  Signed: Juanell Fairly ,MD 08/13/2019, 5:05 PM

## 2019-08-13 NOTE — Progress Notes (Signed)
Physical Therapy Treatment Patient Details Name: Jon Garner. MRN: [id number] DOB: 1965-05-18 Today's Date: 08/13/2019    History of Present Illness 54 y/o male s/p R TKA 7/8.    PT Comments    Pt in recliner agreeable to participate. HEP reviewed with handout. Pt does well, no major pain limitations or difficulty with quads activation, but ROM is much more limited this date. Pt has excessive blood noted on outside of dressing, entire posterior portion red, but not dripping, RN made aware, later assisted RN c dressing changes. Pt able to progress AMB more than double, moving quickly with RW, but deviations remain significant, heavily forward flexed, limited neck mobility, and other mobility limitations based due to severe ROM restriction. Pt tolerates session well, no pain exacerbation, left up in chair with heel elevated so RN/MD can visualize dressing.      Follow Up Recommendations  Home health PT;Supervision - Intermittent     Equipment Recommendations  Rolling walker with 5" wheels (Bariatric RW, Bariatric BSC)    Recommendations for Other Services       Precautions / Restrictions Precautions Precautions: Knee;Fall Precaution Booklet Issued: Yes (comment) Required Braces or Orthoses: Knee Immobilizer - Right Knee Immobilizer - Right: On at all times;Other (comment) (to promotoe TKE at rest) Restrictions Weight Bearing Restrictions: Yes RLE Weight Bearing: Weight bearing as tolerated    Mobility  Bed Mobility Overal bed mobility: Modified Independent             General bed mobility comments: in chair upon entry  Transfers Overall transfer level: Needs assistance Equipment used: Rolling walker (2 wheeled) Transfers: Sit to/from Stand Sit to Stand: Supervision         General transfer comment: does well with BRW  Ambulation/Gait Ambulation/Gait assistance: Supervision Gait Distance (Feet): 160 Feet Assistive device: Rolling walker (2 wheeled)  (BRW)   Gait velocity: 0.39m/s   General Gait Details: remains somewhat forward flexed, increased pain with trying to stand upright.   Stairs             Wheelchair Mobility    Modified Rankin (Stroke Patients Only)       Balance Overall balance assessment: Needs assistance Sitting-balance support: Feet supported;No upper extremity supported Sitting balance-Leahy Scale: Good     Standing balance support: Single extremity supported Standing balance-Leahy Scale: Good Standing balance comment: Pt required single UE support decreasing to BUE support c fatigue                             Cognition Arousal/Alertness: Awake/alert Behavior During Therapy: WFL for tasks assessed/performed Overall Cognitive Status: Within Functional Limits for tasks assessed                                        Exercises Total Joint Exercises Ankle Circles/Pumps: Limitations;Seated Ankle Circles/Pumps Limitations: performing independently prior to entry Quad Sets: Seated;Limitations Quad Sets Limitations: performing independently prior to entry Short Arc Quad: AROM;Right;15 reps;Supine Heel Slides: AAROM;Right;15 reps;Supine Hip ABduction/ADduction: 10 reps;AROM;Right;Supine Straight Leg Raises: AROM;10 reps;Supine;Right Goniometric ROM: 28-76 P/ROM Right Knee Other Exercises Other Exercises: Pt educated re: OT role, DME recs, d/c recs, falls prevention, compression stocking mgmt, polar care mgmt, safe RW technique, adapted dressing techniques Other Exercises: Toileting, LBD, sup<>sit, sit<>stand x2, ~30 ft in room mobility, sitting/standing balance/tolerance    General Comments  Pertinent Vitals/Pain Pain Assessment: No/denies pain Pain Score: 0-No pain Pain Location: R knee/thigh    Home Living Family/patient expects to be discharged to:: Private residence Living Arrangements: Other relatives (Brothers) Available Help at Discharge:  Family Type of Home: House (split level ) Home Access: Stairs to enter Entrance Stairs-Rails: Can reach both Home Layout: Multi-level Home Equipment: None      Prior Function Level of Independence: Independent      Comments: pt works Medical sales representative job), 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    PT Goals (current goals can now be found in the care plan section) Acute Rehab PT Goals Patient Stated Goal: do well so he can get the L knee replaced PT Goal Formulation: With patient Time For Goal Achievement: 08/26/19 Potential to Achieve Goals: Good Progress towards PT goals: Progressing toward goals    Frequency    BID      PT Plan Current plan remains appropriate    Co-evaluation              AM-PAC PT "6 Clicks" Mobility   Outcome Measure  Help needed turning from your back to your side while in a flat bed without using bedrails?: None Help needed moving from lying on your back to sitting on the side of a flat bed without using bedrails?: None Help needed moving to and from a bed to a chair (including a wheelchair)?: None Help needed standing up from a chair using your arms (e.g., wheelchair or bedside chair)?: None Help needed to walk in hospital room?: A Little Help needed climbing 3-5 steps with a railing? : A Little 6 Click Score: 22    End of Session Equipment Utilized During Treatment: Gait belt;Right knee immobilizer Activity Tolerance: Patient tolerated treatment well;No increased pain Patient left: in chair;with call bell/phone within reach;with nursing/sitter in room;with family/visitor present;with SCD's reapplied Nurse Communication: Mobility status PT Visit Diagnosis: Muscle weakness (generalized) (M62.81);Difficulty in walking, not elsewhere classified (R26.2);Pain Pain - Right/Left: Right Pain - part of body: Knee     Time: 1126-1206 PT Time Calculation (min) (ACUTE ONLY): 40 min  Charges:  $Therapeutic Exercise: 38-52 mins                     1:35 PM,  08/13/19 Rosamaria Lints, PT, DPT Physical Therapist - [facility]  [phone] (ASCOM)    [name] C 08/13/2019, 1:29 PM

## 2019-08-13 NOTE — Anesthesia Postprocedure Evaluation (Signed)
Anesthesia Post Note  Patient: Jon Garner.  Procedure(s) Performed: TOTAL KNEE ARTHROPLASTY (Right Knee)  Patient location during evaluation: Nursing Unit Anesthesia Type: Spinal Level of consciousness: oriented and awake and alert Pain management: pain level controlled Vital Signs Assessment: post-procedure vital signs reviewed and stable Respiratory status: spontaneous breathing and respiratory function stable Cardiovascular status: blood pressure returned to baseline and stable Postop Assessment: no headache, no backache, no apparent nausea or vomiting, patient able to bend at knees and able to ambulate Anesthetic complications: no   No complications documented.   Last Vitals:  Vitals:   08/13/19 0010 08/13/19 0409  BP: (!) 119/50 (!) 119/50  Pulse: 67 70  Resp: 18 18  Temp: 36.9 C 36.9 C  SpO2: 98% 97%    Last Pain:  Vitals:   08/13/19 0409  TempSrc: Oral  PainSc:                  Rosanne Gutting

## 2019-08-13 NOTE — Plan of Care (Signed)
  Problem: Pain Managment: Goal: General experience of comfort will improve Outcome: Progressing   

## 2019-08-13 NOTE — Evaluation (Signed)
Occupational Therapy Evaluation Patient Details Name: Jon Garner. MRN: [id number] DOB: 1965/04/03 Today's Date: 08/13/2019    History of Present Illness 54 y/o male s/p R TKA 7/8.   Clinical Impression   Jon Garner was seen for OT evaluation this date, POD#1 from above surgery. Pt was independent in all ADLs prior to surgery including working a desk job, however occasionally using adapted techniques due to B knee pain. Pt currently requires MOD A for LBD while in seated position due to pain and limited AROM of R knee. Pt determined to trial sit<>stand w/o AD this AM - required CGA for safe t/f, increasing to SUPERVISION only c RW use. Pt instructed in polar care mgt, falls prevention strategies, home/routines modifications, DME/AE for LB bathing and dressing tasks, and compression stocking mgt. Pt would benefit from skilled OT services including additional instruction in dressing techniques with assistive devices for dressing and bathing skills to support recall and carryover prior to discharge and ultimately to maximize safety, independence, and minimize falls risk and caregiver burden. Do not currently anticipate any OT needs following this hospitalization.       Follow Up Recommendations  No OT follow up;Supervision - Intermittent    Equipment Recommendations  3 in 1 bedside commode    Recommendations for Other Services       Precautions / Restrictions Precautions Precautions: Knee;Fall Required Braces or Orthoses: Knee Immobilizer - Right Restrictions Weight Bearing Restrictions: Yes RLE Weight Bearing: Weight bearing as tolerated      Mobility Bed Mobility Overal bed mobility: Modified Independent             General bed mobility comments: HoB elevated sup>sit   Transfers Overall transfer level: Needs assistance   Transfers: Sit to/from Stand Sit to Stand: Supervision         General transfer comment: Pt determined to trial sit<>stand w/o AD, required CGA  for safe t/f increasing to SUPERVISION only c RW use.     Balance Overall balance assessment: Needs assistance Sitting-balance support: Feet supported;No upper extremity supported Sitting balance-Leahy Scale: Good     Standing balance support: Single extremity supported Standing balance-Leahy Scale: Good Standing balance comment: Pt required single UE support decreasing to BUE support c fatigue                            ADL either performed or assessed with clinical judgement   ADL Overall ADL's : Needs assistance/impaired                                       General ADL Comments: Independent seated UBD and grooming tasks. MOD A for LBD. SBA + RW + L rail for toilet t/f     Vision         Perception     Praxis      Pertinent Vitals/Pain Pain Assessment: No/denies pain     Hand Dominance     Extremity/Trunk Assessment Upper Extremity Assessment Upper Extremity Assessment: Overall WFL for tasks assessed   Lower Extremity Assessment Lower Extremity Assessment: Overall WFL for tasks assessed       Communication Communication Communication: No difficulties   Cognition Arousal/Alertness: Awake/alert Behavior During Therapy: WFL for tasks assessed/performed Overall Cognitive Status: Within Functional Limits for tasks assessed  General Comments       Exercises Exercises: Other exercises Other Exercises Other Exercises: Pt educated re: OT role, DME recs, d/c recs, falls prevention, compression stocking mgmt, polar care mgmt, safe RW technique, adapted dressing techniques Other Exercises: Toileting, LBD, sup<>sit, sit<>stand x2, ~30 ft in room mobility, sitting/standing balance/tolerance   Shoulder Instructions      Home Living Family/patient expects to be discharged to:: Private residence Living Arrangements: Other relatives (Brothers) Available Help at Discharge: Family Type of  Home: House (split level ) Home Access: Stairs to enter Secretary/administrator of Steps: 2 Entrance Stairs-Rails: Can reach both Home Layout: Multi-level Alternate Level Stairs-Number of Steps: 6 Alternate Level Stairs-Rails: Can reach both Bathroom Shower/Tub: Chief Strategy Officer: Standard     Home Equipment: None          Prior Functioning/Environment Level of Independence: Independent        Comments: pt works Medical sales representative job), 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         OT Problem List: Decreased range of motion;Decreased activity tolerance;Decreased knowledge of use of DME or AE      OT Treatment/Interventions: Self-care/ADL training;Therapeutic exercise;Energy conservation;DME and/or AE instruction;Therapeutic activities;Patient/family education;Balance training    OT Goals(Current goals can be found in the care plan section) Acute Rehab OT Goals Patient Stated Goal: do well so he can get the L knee replaced OT Goal Formulation: With patient Time For Goal Achievement: 08/27/19 Potential to Achieve Goals: Good ADL Goals Pt Will Perform Lower Body Dressing: with caregiver independent in assisting;with min guard assist;sit to/from stand (c LRAD PRN) Pt Will Transfer to Toilet: with modified independence;ambulating;regular height toilet (c LRAD PRN) Pt Will Perform Tub/Shower Transfer: Tub transfer;with min assist;shower seat;rolling walker;Stand pivot transfer  OT Frequency: Min 1X/week   Barriers to D/C: Inaccessible home environment          Co-evaluation              AM-PAC OT "6 Clicks" Daily Activity     Outcome Measure Help from another person eating meals?: None Help from another person taking care of personal grooming?: None Help from another person toileting, which includes using toliet, bedpan, or urinal?: A Little Help from another person bathing (including washing, rinsing, drying)?: A Lot Help from another person to put on and taking off  regular upper body clothing?: None Help from another person to put on and taking off regular lower body clothing?: A Lot 6 Click Score: 19   End of Session Equipment Utilized During Treatment: Rolling walker;Right knee immobilizer  Activity Tolerance: Patient tolerated treatment well Patient left: in chair;with call bell/phone within reach;with SCD's reapplied  OT Visit Diagnosis: Unsteadiness on feet (R26.81);Other abnormalities of gait and mobility (R26.89)                Time: 9518-8416 OT Time Calculation (min): 26 min Charges:  OT General Charges $OT Visit: 1 Visit OT Evaluation $OT Eval Low Complexity: 1 Low OT Treatments $Self Care/Home Management : 23-37 mins  Kathie Dike, M.S. OTR/L  08/13/19, 10:03 AM

## 2019-08-13 NOTE — Progress Notes (Signed)
Physical Therapy Treatment Patient Details Name: Jon Garner. MRN: [id number] DOB: 16-Feb-1965 Today's Date: 08/13/2019    History of Present Illness 54 y/o male s/p R TKA 7/8.    PT Comments    Pt was seate din recliner upon arriving. BP 131/74 HR 87 sao2 96% at rest. He agrees to PT session and rep[orts 2/10 pain prior to wt bearing. 3/10 in wt bearing. He stood form recliner to [employer] with supervision, ambulated ~ 120 ft to rehab gym prior to taking seated rest. Performed stair training without difficulty or LOB with BUe support on +2 rails. Required another seated rest prior to ambulation back to room. Once pt was repositioned in bed via CGA, knee immobilizer re-applied with polar care placed and towel roll under R heel to promote knee ext. Overall pt tolerated session well and is progressing towards PT goals. Will benefit from HHPT at DC to address deficits and continue to progress towards PLOF. Bed alarm in place when therapist exited room.      Follow Up Recommendations  Home health PT;Supervision - Intermittent     Equipment Recommendations  Rolling walker with 5" wheels    Recommendations for Other Services       Precautions / Restrictions Precautions Precautions: Knee;Fall Precaution Booklet Issued: Yes (comment) Required Braces or Orthoses: Knee Immobilizer - Right Knee Immobilizer - Right: On at all times;Other (comment) (other than with PT) Restrictions Weight Bearing Restrictions: Yes RLE Weight Bearing: Weight bearing as tolerated    Mobility  Bed Mobility Overal bed mobility: Needs Assistance Bed Mobility: Sit to Supine       Sit to supine: Min guard   General bed mobility comments: in chair upon entry  Transfers Overall transfer level: Needs assistance Equipment used: Rolling walker (2 wheeled) Transfers: Sit to/from Stand Sit to Stand: Supervision         General transfer comment: does well with BRW  Ambulation/Gait Ambulation/Gait  assistance: Supervision Gait Distance (Feet): 200 Feet Assistive device: Rolling walker (2 wheeled) Gait Pattern/deviations: Step-to pattern;Antalgic;Trunk flexed Gait velocity: decreased   General Gait Details: Pt was able to ambulate 200 ft with slow antalgic step to pattern. no LOB but does require several standing rest and cues for posture correction. pt reports being limited by LLE(non-operative Leg)   Stairs Stairs: Yes Stairs assistance: Min guard Stair Management: Two rails Number of Stairs: 4 General stair comments: Pt was able to safely ascend/descend 4 stair with BUE support on rails   Wheelchair Mobility    Modified Rankin (Stroke Patients Only)       Balance Overall balance assessment: Needs assistance Sitting-balance support: Feet supported;No upper extremity supported Sitting balance-Leahy Scale: Good Sitting balance - Comments: no LOB in sitting   Standing balance support: Bilateral upper extremity supported;During functional activity Standing balance-Leahy Scale: Good Standing balance comment: was able to let go of RW in static standing however relys on BUE during dynamic/functional activity                            Cognition Arousal/Alertness: Awake/alert Behavior During Therapy: WFL for tasks assessed/performed Overall Cognitive Status: Within Functional Limits for tasks assessed                                 General Comments: Pt A and O x 4       Exercises Total Joint Exercises  Ankle Circles/Pumps: Limitations;Seated Ankle Circles/Pumps Limitations: performing independently prior to entry Quad Sets: Seated;Limitations [employer] Limitations: performing independently prior to entry [employer]: AROM;Right;15 reps;Supine Heel Slides: AAROM;Right;15 reps;Supine Hip ABduction/ADduction: 10 reps;AROM;Right;Supine Straight Leg Raises: AROM;10 reps;Supine;Right Goniometric ROM: 28-76 P/ROM Right Knee    General  Comments        Pertinent Vitals/Pain Pain Assessment: 0-10 Pain Score: 3  Pain Location: R knee/thigh Pain Intervention(s): Limited activity within patient's tolerance    Home Living                      Prior Function            PT Goals (current goals can now be found in the care plan section) Acute Rehab PT Goals Patient Stated Goal: I want to get home PT Goal Formulation: With patient Time For Goal Achievement: 08/26/19 Potential to Achieve Goals: Good Progress towards PT goals: Progressing toward goals    Frequency    BID      PT Plan Current plan remains appropriate    Co-evaluation              AM-PAC PT "6 Clicks" Mobility   Outcome Measure  Help needed turning from your back to your side while in a flat bed without using bedrails?: None Help needed moving from lying on your back to sitting on the side of a flat bed without using bedrails?: None Help needed moving to and from a bed to a chair (including a wheelchair)?: None Help needed standing up from a chair using your arms (e.g., wheelchair or bedside chair)?: None Help needed to walk in hospital room?: A Little Help needed climbing 3-5 steps with a railing? : A Little 6 Click Score: 22    End of Session Equipment Utilized During Treatment: Gait belt;Right knee immobilizer Activity Tolerance: Patient tolerated treatment well;No increased pain Patient left: in bed;with call bell/phone within reach;with bed alarm set;with SCD's reapplied Nurse Communication: Mobility status PT Visit Diagnosis: Muscle weakness (generalized) (M62.81);Difficulty in walking, not elsewhere classified (R26.2);Pain Pain - Right/Left: Right Pain - part of body: Knee     Time: 1450-1515 PT Time Calculation (min) (ACUTE ONLY): 25 min  Charges:  $Gait Training: 23-37 mins $Therapeutic Exercise: 38-52 mins                     Jetta Lout PTA 08/13/19, 4:52 PM

## 2019-08-13 NOTE — Plan of Care (Signed)

## 2019-08-13 NOTE — Progress Notes (Signed)
Subjective:  POD #1 s/p right TKA.   Patient reports right knee pain as mild to moderate.  Patient states he was able to walk in the hallway and even begin stair training today.  Patient was noted to have significant drainage on his dressings today which were changed by his RN.  Objective:   VITALS:   Vitals:   08/13/19 0010 08/13/19 0409 08/13/19 0754 08/13/19 1554  BP: (!) 119/50 (!) 119/50 (!) 108/49 133/63  Pulse: 67 70 70 90  Resp: 18 18 16 17   Temp: 98.4 F (36.9 C) 98.5 F (36.9 C) 98.5 F (36.9 C) 100.1 F (37.8 C)  TempSrc: Oral Oral Oral Oral  SpO2: 98% 97% 97% 100%    PHYSICAL EXAM: Right lower extremity:  Neurovascular intact Sensation intact distally Intact pulses distally Dorsiflexion/Plantar flexion intact Incision: dressing C/D/I No cellulitis present Compartment soft  LABS  Results for orders placed or performed during the hospital encounter of 08/12/19 (from the past 24 hour(s))  CBC     Status: Abnormal   Collection Time: 08/13/19  6:37 AM  Result Value Ref Range   WBC 8.3 4.0 - 10.5 K/uL   RBC 3.82 (L) 4.22 - 5.81 MIL/uL   Hemoglobin 11.6 (L) 13.0 - 17.0 g/dL   HCT 10/14/19 (L) 39 - 52 %   MCV 89.0 80.0 - 100.0 fL   MCH 30.4 26.0 - 34.0 pg   MCHC 34.1 30.0 - 36.0 g/dL   RDW 73.4 19.3 - 79.0 %   Platelets 228 150 - 400 K/uL   nRBC 0.0 0.0 - 0.2 %  Basic metabolic panel     Status: Abnormal   Collection Time: 08/13/19  6:37 AM  Result Value Ref Range   Sodium 138 135 - 145 mmol/L   Potassium 4.4 3.5 - 5.1 mmol/L   Chloride 107 98 - 111 mmol/L   CO2 24 22 - 32 mmol/L   Glucose, Bld 121 (H) 70 - 99 mg/dL   BUN 17 6 - 20 mg/dL   Creatinine, Ser 10/14/19 0.61 - 1.24 mg/dL   Calcium 8.0 (L) 8.9 - 10.3 mg/dL   GFR calc non Af Amer >60 >60 mL/min   GFR calc Af Amer >60 >60 mL/min   Anion gap 7 5 - 15    DG Knee Right Port  Result Date: 08/12/2019 CLINICAL DATA:  Status post total knee replacement. EXAM: PORTABLE RIGHT KNEE - 1-2 VIEW COMPARISON:   None. FINDINGS: Right knee arthroplasty in expected alignment. There is been patellar resurfacing. No periprosthetic lucency or fracture. Recent postsurgical change includes air and edema in the soft tissues and joint space with anterior skin staples. IMPRESSION: Right knee arthroplasty without immediate postoperative complication. Electronically Signed   By: 10/13/2019 M.D.   On: 08/12/2019 15:55    Assessment/Plan: 1 Day Post-Op   Active Problems:   S/P TKR (total knee replacement) using cement, right  Patient's incision and dressings are clean and dry currently.  His leg compartments are soft and compressible.  He is neurovascular intact.  Patient will continue with physical therapy.  He will be monitored overnight for any additional drainage from his incision.  If the patient continues to do well, he will be discharged home tomorrow.  Patient will follow-up in our office for outpatient physical therapy 7/14.  He will be discharged on Lovenox 40 mg daily x 2 weeks for DVT prophylaxis or enteric-coated aspirin 3 2 5  mg p.o. twice daily if Lovenox is not covered  by his insurance.    Juanell Fairly , MD 08/13/2019, 4:56 PM

## 2019-08-13 NOTE — TOC Progression Note (Signed)
Transition of Care (TOC) - Progression Note    Patient Details  Name: Jon Garner. MRN: [id number] Date of Birth: 27-Nov-1965  Transition of Care [facility]) CM/SW [place], RN Phone Number: 08/13/2019, 2:42 PM  Clinical Narrative:    Met with the patient in the room to discuss DC plan and need, he lives at home with his 2 brothers and they provide transportation, He is up to date with his PCP and can afford his medications, He needs a bariatric RW and  A 3 in 1 and I notified Zack from [employer] He has an outpatient appointment for the 14th and will not be getting Home Health Services, no additional needs   Expected Discharge Plan: Home/Self Care Barriers to Discharge: Continued Medical Work up  Expected Discharge Plan and Services Expected Discharge Plan: Home/Self Care   Discharge Planning Services: CM Consult   Living arrangements for the past 2 months: Single Family Home                 DME Arranged: Walker rolling, 3-N-1 (bariatriac) DME Agency: [employer] Date DME Agency Contacted: 08/13/19 Time DME Agency Contacted: [phone] Representative spoke with at DME Agency: zack             Social Determinants of Health ([place]) Interventions    Readmission Risk Interventions No flowsheet data found.

## 2019-08-14 LAB — CBC
HCT: 31.5 % — ABNORMAL LOW (ref 39.0–52.0)
Hemoglobin: 10.8 g/dL — ABNORMAL LOW (ref 13.0–17.0)
MCH: 30.1 pg (ref 26.0–34.0)
MCHC: 34.3 g/dL (ref 30.0–36.0)
MCV: 87.7 fL (ref 80.0–100.0)
Platelets: 152 10*3/uL (ref 150–400)
RBC: 3.59 MIL/uL — ABNORMAL LOW (ref 4.22–5.81)
RDW: 13.1 % (ref 11.5–15.5)
WBC: 9 10*3/uL (ref 4.0–10.5)
nRBC: 0 % (ref 0.0–0.2)

## 2019-08-14 NOTE — Progress Notes (Signed)
Patient is being discharged to home with Home Health. Wife will transport home. Belongings packed, IV removed. DC & Rx instructions given and patient acknowledged understanding.

## 2019-08-14 NOTE — Progress Notes (Signed)
Subjective: 2 Days Post-Op Procedure(s) (LRB): TOTAL KNEE ARTHROPLASTY (Right)   Patient is alert and out of bed in the chair. Moderate pain. Planning on going home later today.  Patient reports pain as moderate.  Objective:   VITALS:   Vitals:   08/13/19 2001 08/14/19 0835  BP: (!) 151/65 (!) 163/74  Pulse: 95 89  Resp: 20 18  Temp: 100 F (37.8 C) 99.3 F (37.4 C)  SpO2: 98% 96%    Neurologically intact Neurovascular intact Sensation intact distally Intact pulses distally Dorsiflexion/Plantar flexion intact  LABS Recent Labs    08/13/19 0637 08/14/19 0442  HGB 11.6* 10.8*  HCT 34.0* 31.5*  WBC 8.3 9.0  PLT 228 152    Recent Labs    08/13/19 0637  NA 138  K 4.4  BUN 17  CREATININE 0.98  GLUCOSE 121*    No results for input(s): LABPT, INR in the last 72 hours.   Assessment/Plan: 2 Days Post-Op Procedure(s) (LRB): TOTAL KNEE ARTHROPLASTY (Right)   Plan: Discharge home today. Starts outpatient PT in 4 days. Lovenox for DVT prophylaxis Partial weightbearing on right leg Return to see Dr. Martha Clan in office as scheduled

## 2019-08-14 NOTE — Progress Notes (Signed)
Physical Therapy Treatment Patient Details Name: Jon Garner. MRN: [id number] DOB: September 08, 1965 Today's Date: 08/14/2019    History of Present Illness 54 y/o male s/p R TKA 7/8.    PT Comments    Pt was long sitting in bed with polar care applied and knee immobilizer in place. Reports increased pain today versus previous date. 5/10. He requested to get OOB but not to ambulate into hallways. Pt has demonstrated safe abilities to ambulate and perform stairs in previous acute PT sessions. This session focused on ROM/strength education on what to expect going forward. He required slightly increased assistance to exit bed. He will have help at home to assist and feels confident he can safely perform at home. Performed ROM and strengthening exercises while seated EOB. See list below. After performing exercises, pt was able to stand and get to recliner with supervision only. No difficulty or safety concerns. Lengthy discussion about needing bariatric RW/BSC prior to DC and discussing what to expect with PT going forward. Pt reports he will be unable to have HHPT but will be able to go to outpatient PT at DC. Overall pt is doing well and is cleared from PT standpoint for safe DC home. Pt was in recliner with BLEs elevated, knee immobilizer in place and polar care running at conclusion of session.    Follow Up Recommendations  Home health PT;Supervision - Intermittent     Equipment Recommendations  Rolling walker with 5" wheels;3in1 (PT) (bariatric)    Recommendations for Other Services       Precautions / Restrictions Precautions Precautions: Knee;Fall Precaution Booklet Issued: Yes (comment) Required Braces or Orthoses: Knee Immobilizer - Right Knee Immobilizer - Right: On at all times;Other (comment) (other tahn with PT/OT) Restrictions Weight Bearing Restrictions: Yes RLE Weight Bearing: Weight bearing as tolerated    Mobility  Bed Mobility Overal bed mobility: Needs  Assistance Bed Mobility: Sit to Supine       Sit to supine: Min guard;Min assist   General bed mobility comments: increased assistance to exit bed today 2/2 to pain  Transfers Overall transfer level: Needs assistance Equipment used: Rolling walker (2 wheeled) Transfers: Sit to/from Stand Sit to Stand: Supervision         General transfer comment: Pt demonstrates safe ability to STS from EOB and from recliner  Ambulation/Gait Ambulation/Gait assistance: Supervision Gait Distance (Feet): 8 Feet Assistive device: Rolling walker (2 wheeled) (bariatric) Gait Pattern/deviations: Step-to pattern;Antalgic;Trunk flexed Gait velocity: decreased   General Gait Details: Pt requested not to ambulate into hallway this date but was willing to get OOB and perform ther ex. Pt has demonstrated safe abilities to ambulate household distances in previous acute PT sessions.   Stairs         General stair comments: pt performed stair training safely previous date   Wheelchair Mobility    Modified Rankin (Stroke Patients Only)       Balance Overall balance assessment: Needs assistance Sitting-balance support: Feet supported;No upper extremity supported Sitting balance-Leahy Scale: Good Sitting balance - Comments: no LOB in sitting   Standing balance support: Bilateral upper extremity supported;During functional activity Standing balance-Leahy Scale: Good Standing balance comment: no LOB in standing                            Cognition Arousal/Alertness: Awake/alert Behavior During Therapy: WFL for tasks assessed/performed Overall Cognitive Status: Within Functional Limits for tasks assessed  General Comments: Pt A and O x 4       Exercises Total Joint Exercises Heel Slides: AROM;10 reps;Seated Goniometric ROM: ~3-75 degrees    General Comments        Pertinent Vitals/Pain Pain Assessment: No/denies pain Pain  Score: 0-No pain Pain Location: R knee/thigh Pain Intervention(s): Limited activity within patient's tolerance;Monitored during session;Premedicated before session;Repositioned;Ice applied    Home Living                      Prior Function            PT Goals (current goals can now be found in the care plan section) Acute Rehab PT Goals Patient Stated Goal: go home Progress towards PT goals: Progressing toward goals    Frequency    BID      PT Plan Current plan remains appropriate    Co-evaluation              AM-PAC PT "6 Clicks" Mobility   Outcome Measure  Help needed turning from your back to your side while in a flat bed without using bedrails?: None Help needed moving from lying on your back to sitting on the side of a flat bed without using bedrails?: None Help needed moving to and from a bed to a chair (including a wheelchair)?: None Help needed standing up from a chair using your arms (e.g., wheelchair or bedside chair)?: None Help needed to walk in hospital room?: A Little Help needed climbing 3-5 steps with a railing? : A Little 6 Click Score: 22    End of Session Equipment Utilized During Treatment: Gait belt;Right knee immobilizer Activity Tolerance: Patient tolerated treatment well;No increased pain Patient left: in chair;with call bell/phone within reach;with chair alarm set Nurse Communication: Mobility status PT Visit Diagnosis: Muscle weakness (generalized) (M62.81);Difficulty in walking, not elsewhere classified (R26.2);Pain Pain - Right/Left: Right Pain - part of body: Knee     Time: 8786-7672 PT Time Calculation (min) (ACUTE ONLY): 22 min  Charges:  $Therapeutic Activity: 8-22 mins                     Jetta Lout PTA 08/14/19, 11:47 AM

## 2019-08-14 NOTE — TOC Transition Note (Signed)
Transition of Care [facility]) - CM/SW Discharge Note   Patient Details  Name: Jon Garner. MRN: [id number] Date of Birth: [birth year]  [facility]) CM/SW Contact:  Maud Deed, LCSW Phone Number: 08/14/2019, 3:00 PM   Clinical Narrative:    Pt medically stable for discharge per MD. Pt's DME was not delivered on 08/13/2019. CSW contacted Zack with [employer] regarding this matter. CSW delivered #n1 and RW to pt's room. CSW notified family of discharge.    Final next level of care: Home/Self Care Barriers to Discharge: No Barriers Identified   Patient Goals and CMS Choice Patient states their goals for this hospitalization and ongoing recovery are:: go home      Discharge Placement                Patient to be transferred to facility by: sister Name of family member notified: corrie Patient and family notified of of transfer: 08/14/19  Discharge Plan and Services   Discharge Planning Services: CM Consult            DME Arranged: Dan Humphreys rolling, 3-N-1 (bariatriac) DME Agency: [employer] Date DME Agency Contacted: 08/14/19 Time DME Agency Contacted: [phone] Representative spoke with at DME Agency: Zack            Social Determinants of Health ([place]) Interventions     Readmission Risk Interventions No flowsheet data found.

## 2019-09-20 ENCOUNTER — Telehealth: Payer: Self-pay | Admitting: Internal Medicine

## 2019-09-20 NOTE — Telephone Encounter (Signed)
Pt c/o BP issue: STAT if pt c/o blurred vision, one-sided weakness or slurred speech  1. What are your last 5 BP readings? Now 95/61 Yesterday 89/59  2. Are you having any other symptoms (ex. Dizziness, headache, blurred vision, passed out)? No other symptoms  3. What is your BP issue? Too low

## 2019-09-20 NOTE — Telephone Encounter (Signed)
Patient reports BP running around 89-95/52-60's and HR 69-71. He denies any dizziness or lightheadedness. He "feels fine." He is concerned that its running low.  Only difference is he's had knee surgery and surgeon, Dr Martha Clan, has him on Aspirin 325 mg two times a day. He is staying adequately hydrated. Report he was told by provider his "blood was thin."  Advised him to continue to monitor BP/HR about 2 hours after morning medications for now. Routing to Dr End for advice and review.

## 2019-09-21 MED ORDER — FUROSEMIDE 40 MG PO TABS: 20.0000 mg | ORAL_TABLET | Freq: Every day | ORAL | 3 refills | Status: DC

## 2019-09-21 NOTE — Telephone Encounter (Signed)
It is not uncommon for blood pressure to drop following a major surgery.  As long as he is asymptomatic, I suggest that he cut lisinopril in half to 20 mg daily and continue to monitor his blood pressure.  If he develops further drops in his blood pressure, lightheadedness, syncope, shortness of breath, or chest pain, he should seek immediate medical attention, as he is at risk for PE given recent orthopedic surgery.  Yvonne Kendall, MD [facility]

## 2019-09-21 NOTE — Telephone Encounter (Signed)
Patient returning call and verbalized understanding of Dr Serita Kyle recommendations. Med list updated.

## 2019-09-21 NOTE — Telephone Encounter (Signed)
No answer. Left message to call back to discuss and that I posted to his MyChart to review.

## 2019-10-05 ENCOUNTER — Encounter: Payer: Self-pay | Admitting: Orthopedic Surgery

## 2019-11-23 ENCOUNTER — Other Ambulatory Visit: Payer: Self-pay | Admitting: *Deleted

## 2019-11-23 MED ORDER — METOPROLOL TARTRATE 25 MG PO TABS: ORAL_TABLET | ORAL | 0 refills | Status: DC

## 2019-11-26 ENCOUNTER — Other Ambulatory Visit: Payer: Self-pay

## 2019-11-26 ENCOUNTER — Other Ambulatory Visit: Payer: Self-pay | Admitting: Orthopedic Surgery

## 2019-11-26 ENCOUNTER — Encounter
Admission: RE | Admit: 2019-11-26 | Discharge: 2019-11-26 | Disposition: A | Discharge status: Home / Self Care | Payer: Managed Care, Other (non HMO) | Source: Ambulatory Visit | Attending: Orthopedic Surgery | Admitting: Orthopedic Surgery

## 2019-11-26 ENCOUNTER — Encounter: Payer: Self-pay | Admitting: Orthopedic Surgery

## 2019-11-26 DIAGNOSIS — Z01812 Encounter for preprocedural laboratory examination: Secondary | ICD-10-CM | POA: Insufficient documentation

## 2019-11-26 LAB — SURGICAL PCR SCREEN
MRSA, PCR: NEGATIVE
Staphylococcus aureus: NEGATIVE

## 2019-11-26 LAB — URINALYSIS, ROUTINE W REFLEX MICROSCOPIC
Bilirubin Urine: NEGATIVE
Glucose, UA: NEGATIVE mg/dL
Hgb urine dipstick: NEGATIVE
Ketones, ur: NEGATIVE mg/dL
Leukocytes,Ua: NEGATIVE
Nitrite: NEGATIVE
Protein, ur: NEGATIVE mg/dL
Specific Gravity, Urine: 1.021 (ref 1.005–1.030)
pH: 7 (ref 5.0–8.0)

## 2019-11-26 LAB — CBC
HCT: 38 % — ABNORMAL LOW (ref 39.0–52.0)
Hemoglobin: 12.5 g/dL — ABNORMAL LOW (ref 13.0–17.0)
MCH: 28.9 pg (ref 26.0–34.0)
MCHC: 32.9 g/dL (ref 30.0–36.0)
MCV: 88 fL (ref 80.0–100.0)
Platelets: 286 10*3/uL (ref 150–400)
RBC: 4.32 MIL/uL (ref 4.22–5.81)
RDW: 12.6 % (ref 11.5–15.5)
WBC: 7.6 10*3/uL (ref 4.0–10.5)
nRBC: 0 % (ref 0.0–0.2)

## 2019-11-26 LAB — BASIC METABOLIC PANEL
Anion gap: 10 (ref 5–15)
BUN: 21 mg/dL — ABNORMAL HIGH (ref 6–20)
CO2: 23 mmol/L (ref 22–32)
Calcium: 8.8 mg/dL — ABNORMAL LOW (ref 8.9–10.3)
Chloride: 104 mmol/L (ref 98–111)
Creatinine, Ser: 1.08 mg/dL (ref 0.61–1.24)
GFR, Estimated: >60 mL/min (ref >60)
Glucose, Bld: 108 mg/dL — ABNORMAL HIGH (ref 70–99)
Potassium: 4.2 mmol/L (ref 3.5–5.1)
Sodium: 137 mmol/L (ref 135–145)

## 2019-11-26 LAB — APTT: aPTT: 30 seconds (ref 24–36)

## 2019-11-26 LAB — TYPE AND SCREEN
ABO/RH(D): O POS
Antibody Screen: NEGATIVE

## 2019-11-26 LAB — PROTIME-INR
INR: 0.9 (ref 0.8–1.2)
Prothrombin Time: 12.2 seconds (ref 11.4–15.2)

## 2019-11-26 NOTE — Patient Instructions (Signed)
Your procedure is scheduled on: Tues 11/2 Report to Day Surgery. To find out your arrival time please call [phone] between 1PM - 3PM on  Mon 11/1.  Remember: Instructions that are not followed completely may result in serious medical risk,  up to and including death, or upon the discretion of your surgeon and anesthesiologist your  surgery may need to be rescheduled.     _X__ 1. Do not eat food after midnight the night before your procedure.                 No chewing gum or hard candies. You may drink clear liquids up to 2 hours                 before you are scheduled to arrive for your surgery- DO not drink clear                 liquids within 2 hours of the start of your surgery.                 Clear Liquids include:  water, apple juice without pulp, clear Gatorade, G2 or                  Gatorade Zero (avoid Red/Purple/Blue), Black Coffee or Tea (Do not add                 anything to coffee or tea). _____2.   Complete the "Ensure Clear Pre-surgery Clear Carbohydrate Drink" provided to you, 2 hours before arrival. **If you       are diabetic you will be provided with an alternative drink, Gatorade Zero or G2.  __X__2.  On the morning of surgery brush your teeth with toothpaste and water, you                may rinse your mouth with mouthwash if you wish.  Do not swallow any toothpaste of mouthwash.     ___ 3.  No Alcohol for 24 hours before or after surgery.   ___ 4.  Do Not Smoke or use e-cigarettes For 24 Hours Prior to Your Surgery.                 Do not use any chewable tobacco products for at least 6 hours prior to                 Surgery.  ___  5.  Do not use any recreational drugs (marijuana, cocaine, heroin, ecstasy, MDMA or other)                For at least one week prior to your surgery.  Combination of these drugs with anesthesia                May have life threatening results.  ____  6.  Bring all medications with you on the day of  surgery if instructed.   _x___  7.  Notify your doctor if there is any change in your medical condition      (cold, fever, infections).     Do not wear jewelry,  Do not wear lotions, powders, You may wear deodorant. Do not shave 48 hours prior to surgery. Men may shave face and neck. Do not bring valuables to the hospital.    [facility] is not responsible for any belongings or valuables.  Contacts, dentures or bridgework may not be worn into surgery. Leave your suitcase in the car. After surgery  it may be brought to your room. For patients admitted to the hospital, discharge time is determined by your treatment team.   Patients discharged the day of surgery will not be allowed to drive home.   Make arrangements for someone to be with you for the first 24 hours of your Same Day Discharge.    Please read over the following fact sheets that you were given:    __x__ Take these medicines the morning of surgery with A SIP OF WATER:    1. metoprolol tartrate (LOPRESSOR) 25 MG tablet  2.   3.   4.  5.  6.  ____ Fleet Enema (as directed)   _x___ Use CHG Soap (or wipes) as directed  ____ Use Benzoyl Peroxide Gel as instructed  ____ Use inhalers on the day of surgery  ____ Stop metformin 2 days prior to surgery    ____ Take 1/2 of usual insulin dose the night before surgery. No insulin the morning          of surgery.   __x__ continue aspirin    None morning of surgery   ____ Stop Anti-inflammatories on    __x__ Stop supplements until after surgery.  vitamin C (ASCORBIC ACID) 500 MG tablet, NeuroQ, keto otc supplement vitamin E 1000 UNIT capsule ____ Bring C-Pap to the hospital.    If you have any questions regarding your pre-procedure instructions,  Please call Pre-admit Testing at [phone] [facility]

## 2019-11-26 NOTE — Progress Notes (Addendum)
[facility] Perioperative Services: Pre-Admission/Anesthesia Testing   Date: 11/26/19 Name: Jon Garner. MRN:   [id number]  Re: Consideration of preoperative prophylactic antibiotic change   Request sent to: Juanell Fairly, MD (routed and/or faxed via [facility])  Planned Surgical Procedure(s):    Case: [id number] Date/Time: 12/07/19 0730   Procedure: LEFT TOTAL KNEE ARTHROPLASTY (Left Knee)   Anesthesia type: Choice   Pre-op diagnosis: primary osteoarthritis of Left knee   Location: [facility] OR ROOM 02 / [facility] ORS FOR ANESTHESIA GROUP   Surgeons: Juanell Fairly, MD     Notes: 1. Patient has NO documented allergy to PCN   He has a (+) family history of reaction, however has never reacted to PCN before personally.  2. Received cephalosporin with no documented complications . CEPHALEXIN given on 06/04/2017 with no documented complications.  3. Screened as appropriate for cephalosporin use during medication reconciliation . No immediate angioedema, dysphagia, SOB, anaphylaxis symptoms. . No severe rash involving mucous membranes or skin necrosis. . No hospital admissions related to side effects of PCN/cephalosporin use.  . No documented reaction to PCN or cephalosporin in the last 10 years.  Request:  As an evidence based approach to reducing the rate of incidence for post-operative SSI and the development of MDROs, could an agent with narrower coverage for preoperative prophylaxis in this patient's upcoming surgical course be considered?  1. Currently ordered preoperative prophylactic ABX: clindamycin and vancomycin.   2. Specifically requesting change to cephalosporin (CEFAZOLIN).   3. Please communicate decision with me and I will change the orders in Epic as per your direction.   Things to consider:  Many patients report that they were "allergic" to PCN earlier in life, however this does not translate into a true lifelong allergy. Patients can lose  sensitivity to specific IgE antibodies over time if PCN is avoided (Kleris & Lugar, 2019).   Up to 10% of the adult population and 15% of hospitalized patients report an allergy to PCN, however clinical studies suggest that 90% of those reporting an allergy can tolerate PCN antibiotics (Kleris & Lugar, 2019).   Cross-sensitivity between PCN and cephalosporins has been documented as being as high as 10%, however this estimation included data believed to have been collected in a setting where there was contamination. Newer data suggests that the prevalence of cross-sensitivity between PCN and cephalosporins is actually estimated to be closer to 1% (Hermanides et al., 2018).    Patients labeled as PCN allergic, whether they are truly allergic or not, have been found to have inferior outcomes in terms of rates of serious infection, and these patients tend to have longer hospital stays [facility] & Lugar, 2019).   Treatment related secondary infections, such as Clostridioides difficile, have been linked to the improper use of broad spectrum antibiotics in patients improperly labeled as PCN allergic (Kleris & Lugar, 2019).   Anaphylaxis from cephalosporins is rare and the evidence suggests that there is no increased risk of an anaphylactic type reaction when cephalosporins are used in a PCN allergic patient (Pichichero, 2006).  Citations: Hermanides J, Lemkes BA, Prins Gwenyth Bender MW, Terreehorst I. Presumed ?-Lactam Allergy and Cross-reactivity in the Operating Theater: A Practical Approach. Anesthesiology. 2018 Aug;129(2):335-342. doi: 10.1097/ALN.0000000000002252. PMID: 10258527.  Kleris, R. S., & Lugar, P. L. (2019). Things We Do For No Reason: Failing to Question a Penicillin Allergy History. Journal of hospital medicine, 14(10), [phone]. Advance online publication. airportbarriers.com  Pichichero, M. E. (2006). Cephalosporins can be prescribed safely for penicillin-allergic  patients.  Journal of family medicine, 55(2), 106-112. Accessed: https://cdn.mdedge.com/files/s46fs-public/Document/September-2017/5502JFP_AppliedEvidence1.pdf   Quentin Mulling, MSN, APRN, FNP-C, CEN [facility]  Peri-operative Services Nurse Practitioner 11/26/19 2:21 PM

## 2019-11-26 NOTE — Pre-Procedure Instructions (Signed)
Dr. Martha Clan notified per secure chat of need for orders and H&P for total left knee scheduled for 11/2.

## 2019-11-30 NOTE — Progress Notes (Signed)
  [facility] Perioperative Services: Pre-Admission/Anesthesia Testing     Date: 11/30/19  Name: Jon Garner. MRN:   [id number]  Re: Change in ABX for upcoming surgery   Case: [id number] Date/Time: 12/07/19 0730   Procedure: LEFT TOTAL KNEE ARTHROPLASTY (Left Knee)   Anesthesia type: Choice   Pre-op diagnosis: primary osteoarthritis of Left knee   Location: [facility] OR ROOM 02 / [facility] ORS FOR ANESTHESIA GROUP   Surgeons: Juanell Fairly, MD    Primary attending surgeon was consulted regarding consideration of therapeutic change in antimicrobial agent being used for preoperative prophylaxis in this patient's upcoming surgical case. Following analysis of the risk versus benefits, Dr. Martha Clan, Caryn Bee, MD advising that it would be acceptable to discontinue the ordered clindamycin + vancomycin and place an order for cefazolin 2 gm IV on call to the OR. Orders for this patient were amended by me following collaborative conversation with attending surgeon.  Quentin Mulling, MSN, APRN, FNP-C, CEN [facility]  Peri-operative Services Nurse Practitioner Phone: [phone] 11/30/19 1:11 PM

## 2019-12-03 ENCOUNTER — Other Ambulatory Visit
Admission: RE | Admit: 2019-12-03 | Discharge: 2019-12-03 | Disposition: A | Discharge status: Home / Self Care | Payer: Managed Care, Other (non HMO) | Source: Ambulatory Visit | Attending: Orthopedic Surgery | Admitting: Orthopedic Surgery

## 2019-12-03 ENCOUNTER — Other Ambulatory Visit: Payer: Self-pay

## 2019-12-03 DIAGNOSIS — Z01812 Encounter for preprocedural laboratory examination: Secondary | ICD-10-CM | POA: Insufficient documentation

## 2019-12-03 DIAGNOSIS — Z20822 Contact with and (suspected) exposure to covid-19: Secondary | ICD-10-CM | POA: Insufficient documentation

## 2019-12-03 LAB — SARS CORONAVIRUS 2 (TAT 6-24 HRS): SARS Coronavirus 2: NEGATIVE

## 2019-12-06 MED ORDER — CELECOXIB 200 MG PO CAPS: 200.0000 mg | ORAL_CAPSULE | ORAL | Status: AC

## 2019-12-06 MED ORDER — ACETAMINOPHEN 500 MG PO TABS: 1000.0000 mg | ORAL_TABLET | ORAL | Status: AC

## 2019-12-06 MED ORDER — CHLORHEXIDINE GLUCONATE 0.12 % MT SOLN: 15.0000 mL | Freq: Once | OROMUCOSAL | Status: AC

## 2019-12-06 MED ORDER — CEFAZOLIN SODIUM-DEXTROSE 2-4 GM/100ML-% IV SOLN
2.0000 g | Freq: Once | INTRAVENOUS | Status: AC
  Administered 2019-12-07: 2 g via INTRAVENOUS
  Administered 2019-12-07: 1 g via INTRAVENOUS

## 2019-12-06 MED ORDER — TRANEXAMIC ACID-NACL 1000-0.7 MG/100ML-% IV SOLN: 1000.0000 mg | INTRAVENOUS | Status: DC

## 2019-12-06 MED ORDER — FAMOTIDINE 20 MG PO TABS: 20.0000 mg | ORAL_TABLET | Freq: Once | ORAL | Status: AC

## 2019-12-06 MED ORDER — ORAL CARE MOUTH RINSE: 15.0000 mL | Freq: Once | OROMUCOSAL | Status: AC

## 2019-12-06 MED ORDER — LACTATED RINGERS IV SOLN: INTRAVENOUS | Status: DC

## 2019-12-07 ENCOUNTER — Encounter: Payer: Self-pay | Admitting: Orthopedic Surgery

## 2019-12-07 ENCOUNTER — Ambulatory Visit: Payer: Managed Care, Other (non HMO) | Admitting: Urgent Care

## 2019-12-07 ENCOUNTER — Other Ambulatory Visit: Payer: Self-pay

## 2019-12-07 ENCOUNTER — Observation Stay
Admission: RE | Admit: 2019-12-07 | Discharge: 2019-12-10 | Disposition: A | Discharge status: Home / Self Care | Payer: Managed Care, Other (non HMO) | Attending: Orthopedic Surgery | Admitting: Orthopedic Surgery

## 2019-12-07 ENCOUNTER — Observation Stay: Payer: Managed Care, Other (non HMO)

## 2019-12-07 ENCOUNTER — Encounter
Admission: RE | Disposition: A | Discharge status: Home / Self Care | Payer: Self-pay | Source: Home / Self Care | Attending: Orthopedic Surgery

## 2019-12-07 DIAGNOSIS — M1712 Unilateral primary osteoarthritis, left knee: Secondary | ICD-10-CM | POA: Diagnosis present

## 2019-12-07 DIAGNOSIS — Z23 Encounter for immunization: Secondary | ICD-10-CM | POA: Diagnosis not present

## 2019-12-07 DIAGNOSIS — I10 Essential (primary) hypertension: Secondary | ICD-10-CM | POA: Insufficient documentation

## 2019-12-07 DIAGNOSIS — Z8673 Personal history of transient ischemic attack (TIA), and cerebral infarction without residual deficits: Secondary | ICD-10-CM | POA: Insufficient documentation

## 2019-12-07 DIAGNOSIS — I2510 Atherosclerotic heart disease of native coronary artery without angina pectoris: Secondary | ICD-10-CM | POA: Insufficient documentation

## 2019-12-07 DIAGNOSIS — Z96652 Presence of left artificial knee joint: Secondary | ICD-10-CM

## 2019-12-07 HISTORY — PX: TOTAL KNEE ARTHROPLASTY: SHX125

## 2019-12-07 SURGERY — ARTHROPLASTY, KNEE, TOTAL
Anesthesia: Spinal | Site: Knee | Laterality: Left

## 2019-12-07 MED ORDER — CHLORHEXIDINE GLUCONATE 0.12 % MT SOLN
OROMUCOSAL | Status: AC
  Administered 2019-12-07: 15 mL via OROMUCOSAL
  Filled 2019-12-07: qty 15

## 2019-12-07 MED ORDER — PHENYLEPHRINE HCL (PRESSORS) 10 MG/ML IV SOLN
INTRAVENOUS | Status: DC | PRN
  Administered 2019-12-07 (×2): 100 ug via INTRAVENOUS
  Administered 2019-12-07: 50 ug via INTRAVENOUS

## 2019-12-07 MED ORDER — LIDOCAINE HCL URETHRAL/MUCOSAL 2 % EX GEL
CUTANEOUS | Status: AC
  Filled 2019-12-07: qty 5

## 2019-12-07 MED ORDER — BUPIVACAINE LIPOSOME 1.3 % IJ SUSP
INTRAMUSCULAR | Status: AC
  Filled 2019-12-07: qty 20

## 2019-12-07 MED ORDER — OXYCODONE HCL 5 MG PO TABS
10.0000 mg | ORAL_TABLET | ORAL | Status: DC | PRN
  Administered 2019-12-07 – 2019-12-10 (×5): 10 mg via ORAL
  Filled 2019-12-07 (×5): qty 2

## 2019-12-07 MED ORDER — CEFAZOLIN SODIUM 1 G IJ SOLR
INTRAMUSCULAR | Status: AC
  Filled 2019-12-07: qty 10

## 2019-12-07 MED ORDER — FUROSEMIDE 40 MG PO TABS
40.0000 mg | ORAL_TABLET | Freq: Every day | ORAL | Status: DC
  Administered 2019-12-07 – 2019-12-10 (×4): 40 mg via ORAL
  Filled 2019-12-07 (×4): qty 1

## 2019-12-07 MED ORDER — POTASSIUM CHLORIDE CRYS ER 10 MEQ PO TBCR
10.0000 meq | EXTENDED_RELEASE_TABLET | Freq: Every day | ORAL | Status: DC
  Administered 2019-12-07 – 2019-12-10 (×4): 10 meq via ORAL
  Filled 2019-12-07 (×4): qty 1

## 2019-12-07 MED ORDER — ONDANSETRON HCL 4 MG/2ML IJ SOLN
INTRAMUSCULAR | Status: AC
  Filled 2019-12-07: qty 2

## 2019-12-07 MED ORDER — SODIUM CHLORIDE FLUSH 0.9 % IV SOLN
INTRAVENOUS | Status: AC
  Filled 2019-12-07: qty 40

## 2019-12-07 MED ORDER — ROCURONIUM BROMIDE 10 MG/ML (PF) SYRINGE
PREFILLED_SYRINGE | INTRAVENOUS | Status: AC
  Filled 2019-12-07: qty 10

## 2019-12-07 MED ORDER — FAMOTIDINE 20 MG PO TABS
ORAL_TABLET | ORAL | Status: AC
  Administered 2019-12-07: 20 mg via ORAL
  Filled 2019-12-07: qty 1

## 2019-12-07 MED ORDER — SUCCINYLCHOLINE CHLORIDE 200 MG/10ML IV SOSY
PREFILLED_SYRINGE | INTRAVENOUS | Status: AC
  Filled 2019-12-07: qty 10

## 2019-12-07 MED ORDER — SODIUM CHLORIDE 0.9 % IV SOLN
INTRAVENOUS | Status: DC | PRN
  Administered 2019-12-07: 30 ug/min via INTRAVENOUS

## 2019-12-07 MED ORDER — FENTANYL CITRATE (PF) 100 MCG/2ML IJ SOLN
INTRAMUSCULAR | Status: DC | PRN
  Administered 2019-12-07: 25 ug via INTRAVENOUS

## 2019-12-07 MED ORDER — ONDANSETRON HCL 4 MG/2ML IJ SOLN: 4.0000 mg | Freq: Four times a day (QID) | INTRAMUSCULAR | Status: DC | PRN

## 2019-12-07 MED ORDER — PROPOFOL 500 MG/50ML IV EMUL
INTRAVENOUS | Status: AC
  Filled 2019-12-07: qty 50

## 2019-12-07 MED ORDER — SENNOSIDES-DOCUSATE SODIUM 8.6-50 MG PO TABS
1.0000 | ORAL_TABLET | Freq: Every evening | ORAL | Status: DC | PRN
  Administered 2019-12-10: 1 via ORAL
  Filled 2019-12-07: qty 1

## 2019-12-07 MED ORDER — SODIUM CHLORIDE 0.9 % IV SOLN
INTRAVENOUS | Status: DC | PRN
  Administered 2019-12-07: 60 mL

## 2019-12-07 MED ORDER — ASPIRIN EC 325 MG PO TBEC
325.0000 mg | DELAYED_RELEASE_TABLET | Freq: Two times a day (BID) | ORAL | Status: DC
  Administered 2019-12-07 – 2019-12-10 (×6): 325 mg via ORAL
  Filled 2019-12-07 (×6): qty 1

## 2019-12-07 MED ORDER — ONDANSETRON HCL 4 MG PO TABS: 4.0000 mg | ORAL_TABLET | Freq: Four times a day (QID) | ORAL | Status: DC | PRN

## 2019-12-07 MED ORDER — ONDANSETRON HCL 4 MG/2ML IJ SOLN
INTRAMUSCULAR | Status: DC | PRN
  Administered 2019-12-07: 4 mg via INTRAVENOUS

## 2019-12-07 MED ORDER — BUPIVACAINE-EPINEPHRINE (PF) 0.25% -1:200000 IJ SOLN
INTRAMUSCULAR | Status: AC
  Filled 2019-12-07: qty 60

## 2019-12-07 MED ORDER — VITAMIN E 180 MG (400 UNIT) PO CAPS
1200.0000 [IU] | ORAL_CAPSULE | ORAL | Status: DC
  Administered 2019-12-07 – 2019-12-09 (×2): 1200 [IU] via ORAL
  Filled 2019-12-07 (×2): qty 3

## 2019-12-07 MED ORDER — METOPROLOL TARTRATE 25 MG PO TABS
25.0000 mg | ORAL_TABLET | Freq: Two times a day (BID) | ORAL | Status: DC
  Administered 2019-12-07 – 2019-12-10 (×4): 25 mg via ORAL
  Filled 2019-12-07 (×7): qty 1

## 2019-12-07 MED ORDER — PHENYLEPHRINE HCL (PRESSORS) 10 MG/ML IV SOLN
INTRAVENOUS | Status: AC
  Filled 2019-12-07: qty 1

## 2019-12-07 MED ORDER — ACETAMINOPHEN 325 MG PO TABS
325.0000 mg | ORAL_TABLET | Freq: Four times a day (QID) | ORAL | Status: DC | PRN
  Administered 2019-12-08 – 2019-12-10 (×3): 650 mg via ORAL
  Filled 2019-12-07 (×3): qty 2

## 2019-12-07 MED ORDER — LISINOPRIL 20 MG PO TABS
40.0000 mg | ORAL_TABLET | ORAL | Status: DC
  Administered 2019-12-08 – 2019-12-09 (×2): 40 mg via ORAL
  Administered 2019-12-10: 20 mg via ORAL
  Filled 2019-12-07 (×3): qty 2

## 2019-12-07 MED ORDER — MORPHINE SULFATE 4 MG/ML IJ SOLN
INTRAMUSCULAR | Status: DC | PRN
  Administered 2019-12-07: 4 mg via INTRAMUSCULAR

## 2019-12-07 MED ORDER — ACETAMINOPHEN 500 MG PO TABS
ORAL_TABLET | ORAL | Status: AC
  Administered 2019-12-07: 1000 mg via ORAL
  Filled 2019-12-07: qty 2

## 2019-12-07 MED ORDER — EPHEDRINE 5 MG/ML INJ
INTRAVENOUS | Status: AC
  Filled 2019-12-07: qty 10

## 2019-12-07 MED ORDER — MIDAZOLAM HCL 2 MG/2ML IJ SOLN
INTRAMUSCULAR | Status: DC | PRN
  Administered 2019-12-07 (×2): 1 mg via INTRAVENOUS

## 2019-12-07 MED ORDER — ASCORBIC ACID 500 MG PO TABS
500.0000 mg | ORAL_TABLET | ORAL | Status: DC
  Administered 2019-12-07: 500 mg via ORAL
  Filled 2019-12-07: qty 1

## 2019-12-07 MED ORDER — DEXAMETHASONE SODIUM PHOSPHATE 10 MG/ML IJ SOLN
INTRAMUSCULAR | Status: AC
  Filled 2019-12-07: qty 1

## 2019-12-07 MED ORDER — KETOROLAC TROMETHAMINE 30 MG/ML IJ SOLN
INTRAMUSCULAR | Status: DC | PRN
  Administered 2019-12-07: 30 mg via INTRAMUSCULAR

## 2019-12-07 MED ORDER — METHOCARBAMOL 1000 MG/10ML IJ SOLN
500.0000 mg | Freq: Four times a day (QID) | INTRAVENOUS | Status: DC | PRN
  Filled 2019-12-07: qty 5

## 2019-12-07 MED ORDER — ATORVASTATIN CALCIUM 20 MG PO TABS
80.0000 mg | ORAL_TABLET | Freq: Every day | ORAL | Status: DC
  Administered 2019-12-07 – 2019-12-09 (×3): 80 mg via ORAL
  Filled 2019-12-07 (×3): qty 4

## 2019-12-07 MED ORDER — MAGNESIUM CITRATE PO SOLN
1.0000 | Freq: Once | ORAL | Status: DC | PRN
  Filled 2019-12-07: qty 296

## 2019-12-07 MED ORDER — OXYCODONE HCL 5 MG PO TABS: 5.0000 mg | ORAL_TABLET | ORAL | Status: DC | PRN

## 2019-12-07 MED ORDER — GOLD BOND EXTRA STRENGTH EX POWD: Freq: Every day | CUTANEOUS | Status: DC | PRN

## 2019-12-07 MED ORDER — DIPHENHYDRAMINE HCL 12.5 MG/5ML PO ELIX: 12.5000 mg | ORAL_SOLUTION | ORAL | Status: DC | PRN

## 2019-12-07 MED ORDER — TRAMADOL HCL 50 MG PO TABS
50.0000 mg | ORAL_TABLET | Freq: Four times a day (QID) | ORAL | Status: DC
  Administered 2019-12-07 – 2019-12-10 (×12): 50 mg via ORAL
  Filled 2019-12-07 (×12): qty 1

## 2019-12-07 MED ORDER — PROPOFOL 10 MG/ML IV BOLUS
INTRAVENOUS | Status: AC
  Filled 2019-12-07: qty 20

## 2019-12-07 MED ORDER — DOCUSATE SODIUM 100 MG PO CAPS
100.0000 mg | ORAL_CAPSULE | Freq: Two times a day (BID) | ORAL | Status: DC
  Administered 2019-12-07 – 2019-12-10 (×6): 100 mg via ORAL
  Filled 2019-12-07 (×6): qty 1

## 2019-12-07 MED ORDER — LIDOCAINE HCL (PF) 2 % IJ SOLN
INTRAMUSCULAR | Status: AC
  Filled 2019-12-07: qty 10

## 2019-12-07 MED ORDER — CHLORHEXIDINE GLUCONATE CLOTH 2 % EX PADS: 6.0000 | MEDICATED_PAD | Freq: Once | CUTANEOUS | Status: DC

## 2019-12-07 MED ORDER — TRANEXAMIC ACID-NACL 1000-0.7 MG/100ML-% IV SOLN
INTRAVENOUS | Status: AC
  Filled 2019-12-07: qty 100

## 2019-12-07 MED ORDER — CELECOXIB 200 MG PO CAPS
ORAL_CAPSULE | ORAL | Status: AC
  Administered 2019-12-07: 200 mg via ORAL
  Filled 2019-12-07: qty 1

## 2019-12-07 MED ORDER — FENTANYL CITRATE (PF) 100 MCG/2ML IJ SOLN
INTRAMUSCULAR | Status: AC
  Filled 2019-12-07: qty 2

## 2019-12-07 MED ORDER — TRANEXAMIC ACID 1000 MG/10ML IV SOLN
INTRAVENOUS | Status: AC
  Filled 2019-12-07: qty 10

## 2019-12-07 MED ORDER — CEFAZOLIN SODIUM-DEXTROSE 2-4 GM/100ML-% IV SOLN
2.0000 g | Freq: Four times a day (QID) | INTRAVENOUS | Status: AC
  Administered 2019-12-07 (×2): 2 g via INTRAVENOUS
  Filled 2019-12-07 (×2): qty 100

## 2019-12-07 MED ORDER — MORPHINE SULFATE (PF) 4 MG/ML IV SOLN
INTRAVENOUS | Status: AC
  Filled 2019-12-07: qty 1

## 2019-12-07 MED ORDER — FENTANYL CITRATE (PF) 100 MCG/2ML IJ SOLN: 25.0000 ug | INTRAMUSCULAR | Status: DC | PRN

## 2019-12-07 MED ORDER — PROPOFOL 500 MG/50ML IV EMUL
INTRAVENOUS | Status: DC | PRN
  Administered 2019-12-07: 15 ug/kg/min via INTRAVENOUS

## 2019-12-07 MED ORDER — ACETAMINOPHEN 500 MG PO TABS
1000.0000 mg | ORAL_TABLET | Freq: Four times a day (QID) | ORAL | Status: AC
  Administered 2019-12-07 – 2019-12-08 (×3): 1000 mg via ORAL
  Filled 2019-12-07 (×3): qty 2

## 2019-12-07 MED ORDER — ENOXAPARIN SODIUM 40 MG/0.4ML ~~LOC~~ SOLN: 0.5000 mg/kg | SUBCUTANEOUS | Status: DC

## 2019-12-07 MED ORDER — BUPIVACAINE-EPINEPHRINE 0.25% -1:200000 IJ SOLN
INTRAMUSCULAR | Status: DC | PRN
  Administered 2019-12-07: 60 mL

## 2019-12-07 MED ORDER — ENOXAPARIN SODIUM 40 MG/0.4ML ~~LOC~~ SOLN: 40.0000 mg | SUBCUTANEOUS | Status: DC

## 2019-12-07 MED ORDER — MIDAZOLAM HCL 2 MG/2ML IJ SOLN
INTRAMUSCULAR | Status: AC
  Filled 2019-12-07: qty 2

## 2019-12-07 MED ORDER — NEOMYCIN-POLYMYXIN B GU 40-200000 IR SOLN
Status: DC | PRN
  Administered 2019-12-07: 16 mL

## 2019-12-07 MED ORDER — INFLUENZA VAC SPLIT QUAD 0.5 ML IM SUSY
0.5000 mL | PREFILLED_SYRINGE | INTRAMUSCULAR | Status: AC
  Administered 2019-12-08: 0.5 mL via INTRAMUSCULAR
  Filled 2019-12-07: qty 0.5

## 2019-12-07 MED ORDER — OCUVITE-LUTEIN PO CAPS
2.0000 | ORAL_CAPSULE | Freq: Two times a day (BID) | ORAL | Status: DC
  Administered 2019-12-07 – 2019-12-10 (×6): 2 via ORAL
  Filled 2019-12-07 (×8): qty 2

## 2019-12-07 MED ORDER — CEFAZOLIN SODIUM-DEXTROSE 2-4 GM/100ML-% IV SOLN
INTRAVENOUS | Status: AC
  Filled 2019-12-07: qty 100

## 2019-12-07 MED ORDER — LIDOCAINE HCL (CARDIAC) PF 100 MG/5ML IV SOSY
PREFILLED_SYRINGE | INTRAVENOUS | Status: DC | PRN
  Administered 2019-12-07: 40 mg via INTRAVENOUS

## 2019-12-07 MED ORDER — NEOMYCIN-POLYMYXIN B GU 40-200000 IR SOLN
Status: AC
  Filled 2019-12-07: qty 20

## 2019-12-07 MED ORDER — ONDANSETRON HCL 4 MG/2ML IJ SOLN: 4.0000 mg | Freq: Once | INTRAMUSCULAR | Status: DC | PRN

## 2019-12-07 MED ORDER — CEFAZOLIN SODIUM-DEXTROSE 1-4 GM/50ML-% IV SOLN: INTRAVENOUS | Status: DC | PRN

## 2019-12-07 MED ORDER — METHOCARBAMOL 500 MG PO TABS
500.0000 mg | ORAL_TABLET | Freq: Four times a day (QID) | ORAL | Status: DC | PRN
  Filled 2019-12-07: qty 1

## 2019-12-07 MED ORDER — SODIUM CHLORIDE (PF) 0.9 % IJ SOLN
INTRAMUSCULAR | Status: AC
  Filled 2019-12-07: qty 10

## 2019-12-07 MED ORDER — EPHEDRINE SULFATE 50 MG/ML IJ SOLN
INTRAMUSCULAR | Status: DC | PRN
  Administered 2019-12-07 (×3): 5 mg via INTRAVENOUS
  Administered 2019-12-07: 10 mg via INTRAVENOUS
  Administered 2019-12-07 (×2): 5 mg via INTRAVENOUS

## 2019-12-07 MED ORDER — HYDROMORPHONE HCL 1 MG/ML IJ SOLN: 0.5000 mg | INTRAMUSCULAR | Status: DC | PRN

## 2019-12-07 MED ORDER — SODIUM CHLORIDE 0.9 % IV SOLN: INTRAVENOUS | Status: DC

## 2019-12-07 MED ORDER — BUPIVACAINE HCL (PF) 0.5 % IJ SOLN
INTRAMUSCULAR | Status: DC | PRN
  Administered 2019-12-07: 3 mL via INTRATHECAL

## 2019-12-07 MED ORDER — BISACODYL 5 MG PO TBEC
10.0000 mg | DELAYED_RELEASE_TABLET | Freq: Every day | ORAL | Status: DC | PRN
  Administered 2019-12-10: 10 mg via ORAL
  Filled 2019-12-07: qty 2

## 2019-12-07 SURGICAL SUPPLY — 75 items
BLADE SAW 90X13X1.19 OSCILLAT (BLADE) ×2 IMPLANT
BLADE SAW 90X25X1.19 OSCILLAT (BLADE) ×2 IMPLANT
CANISTER SUCT 3000ML PPV (MISCELLANEOUS) ×2 IMPLANT
CEMENT HV SMART SET (Cement) ×4 IMPLANT
CEMENT TIBIA MBT (Knees) ×1 IMPLANT
CNTNR SPEC 2.5X3XGRAD LEK (MISCELLANEOUS) ×1
CONT SPEC 4OZ STER OR WHT (MISCELLANEOUS) ×1
CONTAINER SPEC 2.5X3XGRAD LEK (MISCELLANEOUS) ×1 IMPLANT
COOLER POLAR GLACIER W/PUMP (MISCELLANEOUS) ×2 IMPLANT
COVER WAND RF STERILE (DRAPES) ×2 IMPLANT
CUFF TOURN SGL QUICK 24 (TOURNIQUET CUFF)
CUFF TOURN SGL QUICK 30 (TOURNIQUET CUFF)
CUFF TOURN SGL QUICK 34 (TOURNIQUET CUFF) ×1
CUFF TRNQT CYL 24X4X16.5-23 (TOURNIQUET CUFF) IMPLANT
CUFF TRNQT CYL 30X4X21-28X (TOURNIQUET CUFF) IMPLANT
CUFF TRNQT CYL 34X4.125X (TOURNIQUET CUFF) ×1 IMPLANT
DRAPE 3/4 80X56 (DRAPES) ×4 IMPLANT
DRAPE IMP U-DRAPE 54X76 (DRAPES) ×4 IMPLANT
DRAPE INCISE IOBAN 66X60 STRL (DRAPES) ×2 IMPLANT
DRAPE SURG 17X11 SM STRL (DRAPES) ×4 IMPLANT
DRSG AQUACEL AG ADV 3.5X14 (GAUZE/BANDAGES/DRESSINGS) ×2 IMPLANT
DRSG OPSITE POSTOP 4X12 (GAUZE/BANDAGES/DRESSINGS) ×2 IMPLANT
DRSG OPSITE POSTOP 4X14 (GAUZE/BANDAGES/DRESSINGS) ×2 IMPLANT
DURAPREP 26ML APPLICATOR (WOUND CARE) ×6 IMPLANT
ELECT CAUTERY BLADE 6.4 (BLADE) ×2 IMPLANT
ELECT REM PT RETURN 9FT ADLT (ELECTROSURGICAL) ×2
ELECTRODE REM PT RTRN 9FT ADLT (ELECTROSURGICAL) ×1 IMPLANT
FEMUR SIGMA PS SZ 4.0 L (Knees) ×2 IMPLANT
GAUZE SPONGE 4X4 12PLY STRL (GAUZE/BANDAGES/DRESSINGS) ×2 IMPLANT
GLOVE BIOGEL M STRL SZ7.5 (GLOVE) ×2 IMPLANT
GLOVE BIOGEL PI IND STRL 8 (GLOVE) ×1 IMPLANT
GLOVE BIOGEL PI IND STRL 9 (GLOVE) ×1 IMPLANT
GLOVE BIOGEL PI INDICATOR 8 (GLOVE) ×1
GLOVE BIOGEL PI INDICATOR 9 (GLOVE) ×1
GLOVE SURG 9.0 ORTHO LTXF (GLOVE) ×4 IMPLANT
GOWN STRL REUS TWL 2XL XL LVL4 (GOWN DISPOSABLE) ×2 IMPLANT
GOWN STRL REUS W/ TWL LRG LVL3 (GOWN DISPOSABLE) ×1 IMPLANT
GOWN STRL REUS W/ TWL LRG LVL4 (GOWN DISPOSABLE) ×1 IMPLANT
GOWN STRL REUS W/TWL LRG LVL3 (GOWN DISPOSABLE) ×1
GOWN STRL REUS W/TWL LRG LVL4 (GOWN DISPOSABLE) ×1
HOLDER FOLEY CATH W/STRAP (MISCELLANEOUS) ×2 IMPLANT
IMMBOLIZER KNEE 19 BLUE UNIV (SOFTGOODS) ×2 IMPLANT
IRRIGATION SURGIPHOR STRL (IV SOLUTION) IMPLANT
KIT TURNOVER KIT A (KITS) ×2 IMPLANT
MANIFOLD NEPTUNE II (INSTRUMENTS) ×2 IMPLANT
NDL SAFETY ECLIPSE 18X1.5 (NEEDLE) ×1 IMPLANT
NEEDLE HYPO 18GX1.5 SHARP (NEEDLE) ×1
NEEDLE HYPO 22GX1.5 SAFETY (NEEDLE) ×2 IMPLANT
NEEDLE SPNL 20GX3.5 QUINCKE YW (NEEDLE) ×2 IMPLANT
NS IRRIG 1000ML POUR BTL (IV SOLUTION) ×2 IMPLANT
PACK TOTAL KNEE (MISCELLANEOUS) ×2 IMPLANT
PAD WRAPON POLOR MULTI XL (MISCELLANEOUS) ×1 IMPLANT
PATELLA DOME PFC 41MM (Knees) ×2 IMPLANT
PENCIL ELECTRO HAND CTR (MISCELLANEOUS) ×2 IMPLANT
PENCIL SMOKE EVACUATOR COATED (MISCELLANEOUS) ×2 IMPLANT
PLATE ROT INSERT 10MM SIZE 4 (Plate) ×2 IMPLANT
PULSAVAC PLUS IRRIG FAN TIP (DISPOSABLE) ×2
SOL .9 NS 3000ML IRR  AL (IV SOLUTION) ×1
SOL .9 NS 3000ML IRR UROMATIC (IV SOLUTION) ×1 IMPLANT
SPONGE LAP 18X18 RF (DISPOSABLE) IMPLANT
STAPLER SKIN PROX 35W (STAPLE) ×2 IMPLANT
SUCTION FRAZIER HANDLE 10FR (MISCELLANEOUS) ×1
SUCTION TUBE FRAZIER 10FR DISP (MISCELLANEOUS) ×1 IMPLANT
SUT ETHIBOND NAB CT1 #1 30IN (SUTURE) ×6 IMPLANT
SUT VIC AB 0 CT1 36 (SUTURE) ×2 IMPLANT
SUT VIC AB 2-0 CT1 (SUTURE) ×4 IMPLANT
SYR 20ML LL LF (SYRINGE) ×2 IMPLANT
SYR 30ML LL (SYRINGE) ×4 IMPLANT
TIBIA MBT CEMENT (Knees) ×2 IMPLANT
TIP FAN IRRIG PULSAVAC PLUS (DISPOSABLE) ×1 IMPLANT
TOWER CARTRIDGE SMART MIX (DISPOSABLE) ×2 IMPLANT
TRAY FOLEY MTR SLVR 16FR STAT (SET/KITS/TRAYS/PACK) ×2 IMPLANT
TUBE SUCT KAM VAC (TUBING) ×2 IMPLANT
WRAP-ON POLOR PAD MULTI XL (MISCELLANEOUS) ×1
WRAPON POLOR PAD MULTI XL (MISCELLANEOUS) ×1

## 2019-12-07 NOTE — Op Note (Signed)
DATE OF SURGERY:  12/07/2019 TIME: 11:50 AM  PATIENT NAME:  Jon Garner.   AGE: 54 y.o.    PRE-OPERATIVE DIAGNOSIS:  primary osteoarthritis of Left knee  POST-OPERATIVE DIAGNOSIS:  Same  PROCEDURE:  Procedure(s): LEFT TOTAL KNEE ARTHROPLASTY  SURGEON:  Juanell Fairly, MD   ASSISTANT:  Jennet Maduro, PA and Cam Hai, PA  OPERATIVE IMPLANTS: Depuy PFC Sigma, Posterior Stabilized Femural component size 4, Tibia size rotating platform component size 3, Patella polyethylene 3-peg oval button size 41, with a 10 mm polyethylene insert.  EBL:  50  TOURNIQUET TIME:  132 minute  PREOPERATIVE INDICATIONS:  Jon Azevedo. is an 54 y.o. male who has a diagnosis of  primary osteoarthritis of Left knee and elected for a left total knee arthroplasty after failing nonoperative treatment.  He has successfully undergone a right total knee arthroplasty by me previously.  His knee pain significantly impacts their activity of daily living.  Radiographs have demonstrated tricompartmental osteoarthritis joint space narrowing, osteophytes, subchondral sclerosis and cyst formation.  The risks, benefits, and alternatives were discussed at length including but not limited to the risks of infection, bleeding, nerve or blood vessel injury, knee stiffness, fracture, dislocation, loosening or failure of the hardware and the need for further surgery. Medical risks include but not limited to DVT and pulmonary embolism, myocardial infarction, stroke, pneumonia, respiratory failure and death. I discussed these risks with the patient in my office prior to the date of surgery. They understood these risks and were willing to proceed.  OPERATIVE FINDINGS AND UNIQUE ASPECTS OF THE CASE: Advanced tricompartmental osteoarthritis  OPERATIVE DESCRIPTION:  The patient was brought to the operative room and placed in a supine position after undergoing placement of a final anesthetic.  A Foley catheter was placed.   IV antibiotics were given. Patient received Kefzol 3 g IV. The left lower extremity was prepped and draped in the usual sterile fashion.  A time out was performed to verify the patient's name, date of birth, medical record number, correct site of surgery and correct procedure to be performed. The timeout was also used to confirm the patient received antibiotics and that appropriate instruments, implants and radiographs studies were available in the room.  The leg was elevated and exsanguinated with an Esmarch and the tourniquet was inflated to 300 mmHg for 132 minutes..  A midline incision was made over the left knee. Full-thickness skin flaps were developed. A medial parapatellar arthrotomy was then made and the patella everted and the knee was brought into 90 of flexion. Hoffa's fat pad along with the cruciate ligaments and medial and lateral menisci were resected.   The distal femoral intramedullary canal was opened with a drill and the intramedullary distal femoral cutting jig was inserted into the femoral canal pinned into position. It was set at 5 degrees resecting 10 mm off the distal femur.  Care was taken to protect the collateral ligaments during distal femoral resection.  The distal femoral resection was performed with an oscillating saw. The femoral cutting guide was then removed.  The extramedullary tibial cutting guide was then placed using the anterior tibial crest and second ray of the foot as a references.  The tibial cutting guide was adjusted to allow for appropriate posterior slope.  The tibial cutting block was pinned into position. The slotted stylus was used to measure the proximal tibial resection of 10 mm off the high lateral side.  The tibial long rod alignment guide was then used  to confirm position of the cutting block. A third cross pin through the tibial cutting block was then drilled into position to allow for rotational stability. Care was taken during the tibial resection to  protect the medial and collateral ligaments.  The resected tibial bone was removed along with the posterior horns of the menisci.  The PCL was sacrificed.  Extension gap was measured with a spacer block and alignment and extension was confirmed using a long alignment rod.  The attention was then turned back to the femur. The posterior referencing distal femoral sizing guide was applied to the distal femur.  The femur was sized to be a size 4. Rotation of the referencing guide was checked with the epicondylar axis and Whitesides line. Then the 4-in-1 cutting jig was then applied to the distal femur. A stylus was used to confirm that the anterior femur would not be notched.   Then the anterior, posterior and chamfer femoral cuts were then made with an oscillating saw.  The flexion gap was then measured with a flexion spacer block and long alignment rod and was found to be symmetric with the extension gap and perpendicular to mechanical axis of the tibia.  The distal femoral preparation was completed by performing the posterior stabilized box cut using the cutting block. The entry site for the intramedullary femoral guide was filled with autologous bone graft from autologous bone.  The proximal tibia plateau was then sized with trial trays. The best coverage was achieved with a size 3. This tibial tray was then pinned into position. The proximal tibia was then prepared with the reamer and keel punch.  After tibial preparation was completed, all trial components were inserted with polyethylene trials.  The knee was found to have excellent balance and full motion with a size 10 mm tibial polyethylene insert..    The attention was then turned to preparation of the patella. The thickness of the patella was measured with a caliper, the diameter measured with the patella templates.  The patella resection was then made with an oscillating saw using the patella cutting guide.  Three peg holes for the patella component  were then drilled. The trial size 41 patella was then placed. Knee was taken through a full range of motion and deemed to be stable with the trial components. All trial components were then removed. The knee capsule was then injected with Exparel.  The knee joint capsule was injected with a mixture of quarter percent Marcaine, Toradol and morphine to assist with postoperative pain relief.  The joint was copiously irrigated with pulse lavage.  The final total knee arthroplasty components were then cemented into place with a 10 mm trial polyethylene insert and all excess methylmethacrylate was removed.  The joint was again copiously irrigated. After the cement had hardened the knee was again taken through a full range of motion. It was felt to be most stable with the 10 mm tibial polyethylene insert. The actual tibial polyethylene insert was then placed.   The knee was taken through a range of motion and the patella tracked well and the knee was again irrigated copiously.    The medial arthrotomy was closed with #1 Ethibond. The subcutaneous tissue closed with 0 and 2-0 vicryl, and skin approximated with staples.  A dry sterile and compressive dressing was applied.  A Polar Care was applied to the operative knee along with a knee immobilizer.  The patient was awakened and brought to the PACU in stable and satisfactory  condition.  All sharp, lap and instrument counts were correct at the conclusion the case. I spoke with the patient's sister by phone from the PACU to let her know the case had been performed without complication and the patient was stable in recovery room.

## 2019-12-07 NOTE — Progress Notes (Signed)
Advanced Directive education offered.     12/07/19 1800  Clinical Encounter Type  Visited With Patient  Visit Type Initial  Referral From Nurse  Spiritual Encounters  Spiritual Needs Other (Comment)

## 2019-12-07 NOTE — Anesthesia Procedure Notes (Signed)
Spinal  Patient location during procedure: OR Start time: 12/07/2019 8:00 AM End time: 12/07/2019 8:38 AM Staffing Performed: anesthesiologist  Preanesthetic Checklist Completed: patient identified, IV checked, site marked, risks and benefits discussed, surgical consent, monitors and equipment checked, pre-op evaluation and timeout performed Spinal Block Patient position: sitting Prep: ChloraPrep Patient monitoring: heart rate, continuous pulse ox, blood pressure and cardiac monitor Approach: midline Location: L4-5 Injection technique: single-shot Needle Needle type: Whitacre and Introducer  Needle gauge: 25 G Needle length: 12.7 cm Additional Notes Negative paresthesia. Negative blood return. Positive free-flowing CSF. Expiration date of kit checked and confirmed. Multiple attempts with 24 G sprotte without success. Moved to the left and with the longer 25 G whitacre successful CSF. Needle was hubbed. Patient tolerated procedure well, without complications.

## 2019-12-07 NOTE — Progress Notes (Signed)
CSW acknowledges consult to follow up on DME/HH needs. Will follow up based on PT/OT recommendations.  Alfonso Ramus, [place] [phone]

## 2019-12-07 NOTE — H&P (Signed)
PREOPERATIVE H&P  Chief Complaint: primary osteoarthritis of Left knee  HPI: Jon Garner. is a 54 y.o. male who presents for preoperative history and physical with a diagnosis of primary osteoarthritis of Left knee. Symptoms of pain, limited ROM and effusions are significantly impairing activities of daily living.  He wished to proceed with a left total knee arthroplasty.  He has successfully had a right TKA with me previously.   Past Medical History:  Diagnosis Date  . Anemia    h/o age 76  . Arthritis   . CAD (coronary artery disease)    a. LHC 4/19: pLAD 95%, mLAD 95%, m/dLAD 90%, ostD2 95%, pLCx 80%, mRCA 70%, dRCA 80%; b. 4-V CABG 05/2017: LIMA-LAD, VG-D1, VG-PDA, VG-OM2  . Essential hypertension 2014  . GERD (gastroesophageal reflux disease) 2015   h/o  . History of kidney stones    h/o  . Morbid obesity (HCC) presently  . Myocardial infarction (HCC) 2019   quadruple bypass  . PXE (pseudoxanthoma elasticum) 1992  . Snores presently  . Stroke [facility]) 1992   a. Age 89   Past Surgical History:  Procedure Laterality Date  . CORONARY ARTERY BYPASS GRAFT N/A 05/29/2017   Procedure: CORONARY ARTERY BYPASS GRAFTING (CABG)  X 4 WITH OPEN HARVESTING OF LEFT AND RIGHT SAPHENOUS VEINS;  Surgeon: Loreli Slot, MD;  Location: [facility] OR;  Service: Open Heart Surgery;  Laterality: N/A;  . KNEE SURGERY Bilateral   . LEFT HEART CATH AND CORONARY ANGIOGRAPHY N/A 05/26/2017   Procedure: LEFT HEART CATH AND CORONARY ANGIOGRAPHY;  Surgeon: Antonieta Iba, MD;  Location: [facility] INVASIVE CV LAB;  Service: Cardiovascular;  Laterality: N/A;  . TEE WITHOUT CARDIOVERSION N/A 05/29/2017   Procedure: TRANSESOPHAGEAL ECHOCARDIOGRAM (TEE);  Surgeon: Loreli Slot, MD;  Location: [facility] OR;  Service: Open Heart Surgery;  Laterality: N/A;  . TONSILLECTOMY     age 83  . TOTAL KNEE ARTHROPLASTY Right 08/12/2019   Procedure: TOTAL KNEE ARTHROPLASTY;  Surgeon: Juanell Fairly, MD;  Location: [facility] ORS;   Service: Orthopedics;  Laterality: Right;   Social History   Socioeconomic History  . Marital status: Single    Spouse name: Not on file  . Number of children: Not on file  . Years of education: Not on file  . Highest education level: Not on file  Occupational History  . Occupation: Chef  Tobacco Use  . Smoking status: Former Smoker    Packs/day: 1.50    Years: 10.00    Pack years: 15.00    Types: Cigarettes    Quit date: 12/05/2013    Years since quitting: 6.0  . Smokeless tobacco: Never Used  Vaping Use  . Vaping Use: Never used  Substance and Sexual Activity  . Alcohol use: Not Currently  . Drug use: Never  . Sexual activity: Not on file    Comment: Not asked  Other Topics Concern  . 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  Does not routinely exercise.   Social Determinants of Health   Financial Resource Strain:   . Difficulty of Paying Living Expenses: Not on file  Food Insecurity:   . Worried About Programme researcher, broadcasting/film/video in the Last Year: Not on file  . Ran Out of Food in the Last Year: Not on file  Transportation Needs:   . Lack of Transportation (Medical): Not on file  . Lack of Transportation (Non-Medical): Not on file  Physical Activity:   .  Days of Exercise per Week: Not on file  . Minutes of Exercise per Session: Not on file  Stress:   . Feeling of Stress : Not on file  Social Connections:   . Frequency of Communication with Friends and Family: Not on file  . Frequency of Social Gatherings with Friends and Family: Not on file  . Attends Religious Services: Not on file  . Active Member of Clubs or Organizations: Not on file  . Attends Banker Meetings: Not on file  . Marital Status: Not on file   Family History  Problem Relation Age of Onset  . Dementia Mother   . Other Father        died in his 62's  . COPD Father   . Multiple sclerosis Sister    Allergies  Allergen Reactions  . Penicillins Other (See  Comments)    Family has had severe reactions to penicillins      Prior to Admission medications   Medication Sig Start Date End Date Taking? Authorizing Provider  acetaminophen (TYLENOL) 500 MG tablet Take 1,000 mg by mouth every 6 (six) hours as needed for moderate pain or headache.   Yes [provider]  aspirin EC 81 MG tablet Take 81 mg by mouth daily.    Yes [provider]  atorvastatin (LIPITOR) 80 MG tablet Take 1 tablet (80 mg total) by mouth daily at 6 PM. 06/17/19  Yes End, Cristal Deer, MD  furosemide (LASIX) 40 MG tablet Take 0.5 tablets (20 mg total) by mouth daily. Patient taking differently: Take 40 mg by mouth daily.  09/21/19  Yes End, Cristal Deer, MD  lisinopril (ZESTRIL) 40 MG tablet Take 1 tablet (40 mg total) by mouth daily. Patient taking differently: Take 40 mg by mouth every morning.  06/17/19  Yes End, Cristal Deer, MD  Menthol, Topical Analgesic, (GOLD BOND EXTRA STRENGTH EX) Apply 1 application topically daily as needed (dry skin).   Yes [provider]  metoprolol tartrate (LOPRESSOR) 25 MG tablet TAKE 1 TABLET(25 MG) BY MOUTH TWICE DAILY 11/23/19  Yes End, Cristal Deer, MD  Multiple Vitamins-Minerals (PRESERVISION AREDS 2) CAPS Take 2 capsules by mouth 2 (two) times daily.   Yes [provider]  OVER THE COUNTER MEDICATION Take 2 capsules by mouth daily. NeuroQ   Yes [provider]  OVER THE COUNTER MEDICATION Take 2 tablets by mouth daily. oneshot keto otc supplement   Yes [provider]  potassium chloride (KLOR-CON) 10 MEQ tablet Take 1 tablet (10 mEq total) by mouth daily. 06/17/19  Yes End, Cristal Deer, MD  vitamin C (ASCORBIC ACID) 500 MG tablet Take 500 mg by mouth 4 (four) times a week.    Yes [provider]  vitamin E 1000 UNIT capsule Take 1,000 Units by mouth 4 (four) times a week.   Yes [provider]  docusate sodium (COLACE) 100 MG capsule Take 1 capsule (100 mg total) by mouth 2  (two) times daily. Patient not taking: Reported on 11/18/2019 08/13/19   Juanell Fairly, MD  enoxaparin (LOVENOX) 40 MG/0.4ML injection Inject 0.4 mLs (40 mg total) into the skin daily for 14 doses. Patient not taking: Reported on 11/18/2019 08/14/19 11/18/19  Juanell Fairly, MD  nitroGLYCERIN (NITROSTAT) 0.4 MG SL tablet Place 1 tablet (0.4 mg total) under the tongue every 5 (five) minutes as needed for chest pain. Maximum of 3 doses. Patient not taking: Reported on 12/07/2019 07/22/18   End, Cristal Deer, MD  oxyCODONE (OXY IR/ROXICODONE) 5 MG immediate release  tablet Take 1-2 tablets (5-10 mg total) by mouth every 4 (four) hours as needed for moderate pain (pain score 4-6). Patient not taking: Reported on 11/18/2019 08/13/19   Juanell Fairly, MD     Positive ROS: All other systems have been reviewed and were otherwise negative with the exception of those mentioned in the HPI and as above.  Physical Exam: General: Alert, no acute distress Cardiovascular: Regular rate and rhythm, no murmurs rubs or gallops.  No pedal edema Respiratory: Clear to auscultation bilaterally, no wheezes rales or rhonchi. No cyanosis, no use of accessory musculature GI: No organomegaly, abdomen is soft and non-tender nondistended with positive bowel sounds. Skin: Skin intact, no lesions within the operative field. Neurologic: Sensation intact distally Psychiatric: Patient is competent for consent with normal mood and affect Lymphatic: No cervical lymphadenopathy  MUSCULOSKELETAL: Left KNEE:  ROM 0-110.  No ligamentous instability.  NVI.  Tenderness over medial joint line.  + Patellafemoral crepitus.    Assessment: primary osteoarthritis of Left knee  Plan: Plan for Procedure(s): LEFT TOTAL KNEE ARTHROPLASTY  Patient understands the details of the operation and post-op course having been through this surgery previously.  I discussed the risks and benefits of surgery. He understands the risks include but are  not limited to infection, bleeding requiring blood transfusion, nerve or blood vessel injury, joint stiffness or loss of motion, persistent pain, weakness or instability, and hardware failure/loosening and the need for further surgery. Medical risks include but are not limited to DVT and pulmonary embolism, myocardial infarction, stroke, pneumonia, respiratory failure and death. Patient understood these risks and wished to proceed.    Juanell Fairly, MD   12/07/2019 7:54 AM

## 2019-12-07 NOTE — Progress Notes (Signed)
Pt transferred from PACU in stable condition.  VS taken.  Pt is A/O. Pt instructed on call bell and pain mgmnt.

## 2019-12-07 NOTE — Progress Notes (Addendum)
  Subjective:  Postop check:s/p left total knee arthroplasty.   Patient reports left knee pain as moderate.  Patient has mild paresthesias in left foot.  Patient has no other complaints.  Patient states he was able to walk into the hallway with physical therapy this afternoon.  Objective:   VITALS:   Vitals:   12/07/19 1249 12/07/19 1354 12/07/19 1450 12/07/19 1547  BP: (!) 106/27 (!) 91/57 (!) 110/40 (!) 149/80  Pulse: 66 60 73 78  Resp: 16 15 15 16   Temp: (!) 96.3 F (35.7 C) (!) 97.1 F (36.2 C) 97.6 F (36.4 C) 97.6 F (36.4 C)  TempSrc: Axillary  Oral Oral  SpO2: 94% 97% 99% 100%  Weight:      Height:        PHYSICAL EXAM: Left lower extremity: Despite paresthesias, patient has sensation light touch throughout the left foot.  He can dorsiflex and plantarflex the left ankle without weakness. Neurovascular intact Sensation intact distally Intact pulses distally Dorsiflexion/Plantar flexion intact Incision: no drainage No cellulitis present Compartment soft  LABS  No results found for this or any previous visit (from the past 24 hour(s)).  DG Knee Left Port  Result Date: 12/07/2019 CLINICAL DATA:  Status post left total knee replacement. EXAM: PORTABLE LEFT KNEE - 1-2 VIEW COMPARISON:  July 25, 2010. FINDINGS: The left femoral and tibial components are well situated. Expected postoperative changes are noted in the soft tissues anteriorly. IMPRESSION: Status post left total knee arthroplasty. Electronically Signed   By: July 27, 2010 M.D.   On: 12/07/2019 12:38    Assessment/Plan: Day of Surgery   Active Problems:   S/P TKR (total knee replacement) using cement, left  Patient doing well following left total knee arthroplasty today.  Patient's paresthesias in the left foot are likely due to his tourniquet versus spinal.  I expect paresthesias to resolve.  Patient had significant bleeding on Lovenox postop after his right total knee arthroplasty.  Therefore he has  been placed on enteric-coated aspirin 325 mg p.o. twice daily for DVT prophylaxis during this hospitalization to avoid significant postop bleeding.  Patient will Foley catheter removed in the morning.  Continue current pain management.  Patient will continue physical therapy tomorrow.  He is weightbearing as tolerated on the left lower extremity.  I reviewed the postoperative x-ray which shows total knee arthroplasty components are well-positioned there is no evidence of fracture dislocation or other postop complication.    13/03/2019 , MD 12/07/2019, 4:47 PM

## 2019-12-07 NOTE — Evaluation (Signed)
Physical Therapy Evaluation Patient Details Name: Jon Garner. MRN: [id number] DOB: [birth year] Today's Date: 12/07/2019   History of Present Illness  Patient is a 54 year old male with primary osteoarthritis of left knee, status post left total knee arthroplasty on 12/07/19. Patient has a history of right TKA, previous stroke with left side weakness and hyper-reflexia residual, morbid obesity, anemia, MI, CABG, HTN  Clinical Impression  PT evaluation completed. Patient eager to participate with PT efforts and mobility. Knee immobilizer removed during activity. Patient complains of 6/10 left knee pain with activity with AROM left knee 3-74 degrees. Patient required Min guard assistance for transfers from bed, and Min guard assistance for ambulating 73ft with rolling walker. Patient participated in LLE strengthenin/ROM exercises. Patient is planning to return home with support from family and has rolling walker in place. Recommend PT to address functional limitations to maximize independence for discharge home.     Follow Up Recommendations Home health PT    Equipment Recommendations  Other (comment) (patient is requesting cane )    Recommendations for Other Services       Precautions / Restrictions Precautions Precautions: Fall Required Braces or Orthoses: Knee Immobilizer - Left Knee Immobilizer - Left:  (knee immobilizer at all times except when in CPM/PT ) Restrictions Weight Bearing Restrictions: Yes LLE Weight Bearing: Weight bearing as tolerated      Mobility  Bed Mobility Overal bed mobility: Needs Assistance Bed Mobility: Supine to Sit;Sit to Supine     Supine to sit: Supervision Sit to supine: Min assist   General bed mobility comments: occasional assistance for LLE support for return to bed     Transfers Overall transfer level: Needs assistance Equipment used: Rolling walker (2 wheeled) Transfers: Sit to/from Stand Sit to Stand: Min guard          General transfer comment: verbal cues for sequencing and technique   Ambulation/Gait Ambulation/Gait assistance: Min guard Gait Distance (Feet): 20 Feet Assistive device: Rolling walker (2 wheeled) Gait Pattern/deviations: Antalgic;Step-to pattern;Decreased stance time - left Gait velocity: decreased    General Gait Details: patient ambulated 20 ft with rolling walker with occasional cues for safety and technique, importance of using rolling walker   Stairs            Wheelchair Mobility    Modified Rankin (Stroke Patients Only)       Balance Overall balance assessment: Needs assistance Sitting-balance support: Feet supported Sitting balance-Leahy Scale: Good Sitting balance - Comments: no loss of balance in sitting position    Standing balance support: Bilateral upper extremity supported Standing balance-Leahy Scale: Fair Standing balance comment: patient relying on rolling walker for UE support in standing position                              Pertinent Vitals/Pain Pain Assessment: 0-10 Pain Score: 6  Pain Location: left knee  Pain Descriptors / Indicators: Sore Pain Intervention(s): Limited activity within patient's tolerance;Premedicated before session (polar care reapplied at end of session )    Home Living Family/patient expects to be discharged to:: Private residence Living Arrangements: Other relatives Available Help at Discharge: Family Type of Home: House Home Access: Stairs to enter Entrance Stairs-Rails: Can reach both Entrance Stairs-Number of Steps: 2 Home Layout: Multi-level Home Equipment: Walker - 2 wheels Additional Comments: patient is requesting a cane for when he no longer needs the rolling walker     Prior Function Level  of Independence: Independent with assistive device(s)         Comments: patient uses rolling walker when ambulating at work, otherwise does not use rolling walker      Hand Dominance         Extremity/Trunk Assessment   Upper Extremity Assessment Upper Extremity Assessment: Overall WFL for tasks assessed    Lower Extremity Assessment Lower Extremity Assessment: LLE deficits/detail LLE Deficits / Details: patient able to SLR x 10 reps independently, perform LAQ x 10 reps in sitting independently, and perform ankle dorsiflexion/plantarflexion in gravity eliminated position  LLE Sensation: WNL (patient correctly detects light touch 5/5 times )       Communication   Communication: No difficulties  Cognition Arousal/Alertness: Awake/alert Behavior During Therapy: WFL for tasks assessed/performed Overall Cognitive Status: Within Functional Limits for tasks assessed                                        General Comments      Exercises Total Joint Exercises Ankle Circles/Pumps: AROM;Strengthening;Left;10 reps;Supine Straight Leg Raises: AROM;Strengthening;Left;10 reps;Supine Long Arc Quad: AROM;Strengthening;Left;10 reps;Seated Goniometric ROM: left knee active ROM 3-74 degrees  Other Exercises Other Exercises: verbal cues for exercise technique. patient issued TKA home exercise program handout    Assessment/Plan    PT Assessment Patient needs continued PT services  PT Problem List Decreased strength;Decreased range of motion;Decreased balance;Decreased mobility;Decreased safety awareness;Decreased knowledge of use of DME;Decreased activity tolerance;Decreased knowledge of precautions;Pain       PT Treatment Interventions DME instruction;Gait training;Stair training;Functional mobility training;Therapeutic activities;Balance training;Therapeutic exercise;Patient/family education    PT Goals (Current goals can be found in the Care Plan section)  Acute Rehab PT Goals Patient Stated Goal: to go home  PT Goal Formulation: With patient Time For Goal Achievement: 12/21/19 Potential to Achieve Goals: Good Additional Goals Additional Goal #1: patient  will increase left knee flexion to 90 degrees for increased independence with functional mobility    Frequency BID   Barriers to discharge        Co-evaluation               AM-PAC PT "6 Clicks" Mobility  Outcome Measure Help needed turning from your back to your side while in a flat bed without using bedrails?: A Little Help needed moving from lying on your back to sitting on the side of a flat bed without using bedrails?: A Little Help needed moving to and from a bed to a chair (including a wheelchair)?: A Little Help needed standing up from a chair using your arms (e.g., wheelchair or bedside chair)?: A Little Help needed to walk in hospital room?: A Little Help needed climbing 3-5 steps with a railing? : A Little 6 Click Score: 18    End of Session Equipment Utilized During Treatment: Gait belt Activity Tolerance: Patient tolerated treatment well Patient left: in bed;with call bell/phone within reach;with SCD's reapplied;with bed alarm set (polar care reapplied, SCD reapplied to right leg )Knee immobilizer left knee reapplied Nurse Communication: Mobility status PT Visit Diagnosis: Other abnormalities of gait and mobility (R26.89);Pain Pain - Right/Left: Left Pain - part of body: Knee    Time: 4580-9983 PT Time Calculation (min) (ACUTE ONLY): 38 min   Charges:   PT Evaluation $PT Eval Moderate Complexity: 1 Mod PT Treatments $Gait Training: 8-22 mins $Therapeutic Exercise: 8-22 mins        French Ana  Roxan Hockey, PT, MPT   Ina Homes 12/07/2019, 4:30 PM

## 2019-12-07 NOTE — Anesthesia Preprocedure Evaluation (Addendum)
Anesthesia Evaluation  Patient identified by MRN, date of birth, ID band Patient awake    Reviewed: Allergy & Precautions, NPO status , Patient's Chart, lab work & pertinent test results  History of Anesthesia Complications Negative for: history of anesthetic complications  Airway Mallampati: III       Dental   Pulmonary neg sleep apnea, neg COPD, Not current smoker, former smoker,           Cardiovascular hypertension, Pt. on medications + Past MI and + CABG  (-) CHF (-) dysrhythmias (-) Valvular Problems/Murmurs     Neuro/Psych neg Seizures CVA (L sided hyper-reflexia residual, L sided weakness at the time), Residual Symptoms    GI/Hepatic Neg liver ROS, neg GERD  ,  Endo/Other  neg diabetesMorbid obesity  Renal/GU Renal disease (stones)     Musculoskeletal   Abdominal   Peds  Hematology  (+) anemia ,   Anesthesia Other Findings   Reproductive/Obstetrics                             Anesthesia Physical Anesthesia Plan  ASA: III  Anesthesia Plan: Spinal   Post-op Pain Management:    Induction: Intravenous  PONV Risk Score and Plan: 2  Airway Management Planned: Nasal Cannula  Additional Equipment:   Intra-op Plan:   Post-operative Plan:   Informed Consent: I have reviewed the patients History and Physical, chart, labs and discussed the procedure including the risks, benefits and alternatives for the proposed anesthesia with the patient or authorized representative who has indicated his/her understanding and acceptance.       Plan Discussed with:   Anesthesia Plan Comments:        Anesthesia Quick Evaluation

## 2019-12-07 NOTE — Transfer of Care (Signed)
Immediate Anesthesia Transfer of Care Note  Patient: Jon Garner.  Procedure(s) Performed: LEFT TOTAL KNEE ARTHROPLASTY (Left Knee)  Patient Location: PACU  Anesthesia Type:General  Level of Consciousness: awake  Airway & Oxygen Therapy: Patient Spontanous Breathing  Post-op Assessment: Report given to RN  Post vital signs: stable  Last Vitals:  Vitals Value Taken Time  BP 99/46 12/07/19 1151  Temp    Pulse 66 12/07/19 1156  Resp 0 12/07/19 1156  SpO2 99 % 12/07/19 1156  Vitals shown include unvalidated device data.  Last Pain:  Vitals:   12/07/19 1151  TempSrc:   PainSc: (P) 0-No pain         Complications: No complications documented.

## 2019-12-08 ENCOUNTER — Encounter: Payer: Self-pay | Admitting: Orthopedic Surgery

## 2019-12-08 DIAGNOSIS — M1712 Unilateral primary osteoarthritis, left knee: Secondary | ICD-10-CM | POA: Diagnosis not present

## 2019-12-08 LAB — BASIC METABOLIC PANEL
Anion gap: 7 (ref 5–15)
BUN: 22 mg/dL — ABNORMAL HIGH (ref 6–20)
CO2: 24 mmol/L (ref 22–32)
Calcium: 7.9 mg/dL — ABNORMAL LOW (ref 8.9–10.3)
Chloride: 107 mmol/L (ref 98–111)
Creatinine, Ser: 1.28 mg/dL — ABNORMAL HIGH (ref 0.61–1.24)
GFR, Estimated: >60 mL/min (ref >60)
Glucose, Bld: 129 mg/dL — ABNORMAL HIGH (ref 70–99)
Potassium: 4.9 mmol/L (ref 3.5–5.1)
Sodium: 138 mmol/L (ref 135–145)

## 2019-12-08 LAB — CBC
HCT: 29.6 % — ABNORMAL LOW (ref 39.0–52.0)
Hemoglobin: 9.3 g/dL — ABNORMAL LOW (ref 13.0–17.0)
MCH: 29 pg (ref 26.0–34.0)
MCHC: 31.4 g/dL (ref 30.0–36.0)
MCV: 92.2 fL (ref 80.0–100.0)
Platelets: 181 10*3/uL (ref 150–400)
RBC: 3.21 MIL/uL — ABNORMAL LOW (ref 4.22–5.81)
RDW: 12.7 % (ref 11.5–15.5)
WBC: 7 10*3/uL (ref 4.0–10.5)
nRBC: 0 % (ref 0.0–0.2)

## 2019-12-08 NOTE — Progress Notes (Signed)
Physical Therapy Treatment Patient Details Name: Jon Garner. MRN: [id number] DOB: [birth year] Today's Date: 12/08/2019    History of Present Illness      PT Comments    Pt seen in pm for continued PT. Pt completed gait training 48ft x 2 with RW and CGA for safety and proper heel strike on L LE. Pt educated not to pivot on L LE as well. Pt assisted to toilet, vc's for safe technique, CGA for transfer.  Pt required MinA to raise L LE up onto bed during sit to supine. Negative SLR, knee immobilizer placed on with towel role under heelcord.  Pt given HEP and encouraged to continue independently. Plan was to attempt stairs this pm, however pt with 8/10 L knee pain requesting pain meds and declined stairs.  Follow Up Recommendations  Home health PT     Equipment Recommendations       Recommendations for Other Services       Precautions / Restrictions Precautions Precautions: Fall Required Braces or Orthoses: Knee Immobilizer - Left Knee Immobilizer - Left: On at all times;On except when in CPM (or with PT) Restrictions Weight Bearing Restrictions: No    Mobility  Bed Mobility Overal bed mobility: Needs Assistance Bed Mobility: Supine to Sit;Sit to Supine     Supine to sit: Supervision Sit to supine: Min assist   General bed mobility comments:  (Pt required assistance to raise L LE up onto bed)  Transfers Overall transfer level: Needs assistance Equipment used: Rolling walker (2 wheeled) Transfers: Sit to/from Stand Sit to Stand: Min guard         General transfer comment: min cuing for technique  Ambulation/Gait Ambulation/Gait assistance: Min guard Gait Distance (Feet): 40 Feet Assistive device: Rolling walker (2 wheeled) Gait Pattern/deviations: Antalgic;Step-to pattern;Decreased stance time - left Gait velocity: decreased        Stairs Stairs:  (Pt declined)           Wheelchair Mobility    Modified Rankin (Stroke Patients Only)        Balance                                            Cognition Arousal/Alertness: Awake/alert Behavior During Therapy: WFL for tasks assessed/performed Overall Cognitive Status: Within Functional Limits for tasks assessed                                        Exercises Total Joint Exercises Goniometric ROM: L Knee ROM sitting 3-81 degrees    General Comments        Pertinent Vitals/Pain Pain Assessment: 0-10 Pain Score: 8  Pain Location: left knee  Pain Descriptors / Indicators: Sore;Aching;Discomfort Pain Intervention(s): Limited activity within patient's tolerance    Home Living                      Prior Function            PT Goals (current goals can now be found in the care plan section) Acute Rehab PT Goals Patient Stated Goal: to go home     Frequency    BID      PT Plan Current plan remains appropriate (Pt would benefit from stair training prior to d/c home)  Co-evaluation              AM-PAC PT "6 Clicks" Mobility   Outcome Measure  Help needed turning from your back to your side while in a flat bed without using bedrails?: A Little Help needed moving from lying on your back to sitting on the side of a flat bed without using bedrails?: A Little Help needed moving to and from a bed to a chair (including a wheelchair)?: A Little Help needed standing up from a chair using your arms (e.g., wheelchair or bedside chair)?: A Little Help needed to walk in hospital room?: A Little Help needed climbing 3-5 steps with a railing? : A Little 6 Click Score: 18    End of Session Equipment Utilized During Treatment: Gait belt Activity Tolerance: Patient limited by pain Patient left: in bed;with bed alarm set;with SCD's reapplied (towel role under left heelcord) Nurse Communication: Mobility status;Patient requests pain meds PT Visit Diagnosis: Other abnormalities of gait and mobility (R26.89);Pain Pain -  Right/Left: Left Pain - part of body: Knee     Time: 1400-1445 PT Time Calculation (min) (ACUTE ONLY): 45 min  Charges:  $Gait Training: 8-22 mins $Therapeutic Exercise: 8-22 mins $Therapeutic Activity: 8-22 mins                     Zadie Cleverly, PTA  Jannet Askew 12/08/2019, 4:08 PM

## 2019-12-08 NOTE — TOC Initial Note (Addendum)
Transition of Care (TOC) - Initial/Assessment Note    Patient Details  Name: Jon Garner. MRN: [id number] Date of Birth: 1966/01/04  Transition of [facility]) CM/SW Contact:    Liliana Cline, LCSW Phone Number: 12/08/2019, 12:49 PM  Clinical Narrative:                CSW spoke with patient. Patient lives with 2 brothers, sister provides transportation. PCP is Dr. Sullivan Lone. Cardiologist is Dr. Okey Dupre. Pharmacy is [facility] [address] in [place], [place]. Patient has a RW and 3in1 at home. Patient reported he used [facility] for Outpatient PT with his last surgery and plans to use them again this time rather than Home Health. He requested a cane. Cane ordered from [employer], asked MD to put in order for cane. No additional needs identified.  1:00- Update from [employer] Rep Elease Hashimoto that insurance won't cover a cane since patient previously got a RW from them. Patient aware and declines paying privately for a cane.   Expected Discharge Plan: OP Rehab Barriers to Discharge: Continued Medical Work up   Patient Goals and CMS Choice Patient states their goals for this hospitalization and ongoing recovery are:: home with outpatient pt CMS Medicare.gov Compare Post Acute Care list provided to:: Patient Choice offered to / list presented to : Patient  Expected Discharge Plan and Services Expected Discharge Plan: OP Rehab       Living arrangements for the past 2 months: Single Family Home                 DME Arranged: [facility] Mor DME Agency: [employer] Date DME Agency Contacted: 12/08/19   Representative spoke with at DME Agency: Santina Evans            Prior Living Arrangements/Services Living arrangements for the past 2 months: Single Family Home Lives with:: Siblings Patient language and need for interpreter reviewed:: Yes Do you feel safe going back to the place where you live?: Yes      Need for Family Participation in Patient Care: Yes  (Comment) Care giver support system in place?: Yes (comment) Current home services: DME Criminal Activity/Legal Involvement Pertinent to Current Situation/Hospitalization: No - Comment as needed  Activities of Daily Living Home Assistive Devices/Equipment: Bedside commode/3-in-1 ADL Screening (condition at time of admission) Patient's cognitive ability adequate to safely complete daily activities?: Yes Is the patient deaf or have difficulty hearing?: No Does the patient have difficulty seeing, even when wearing glasses/contacts?: No Does the patient have difficulty concentrating, remembering, or making decisions?: No Patient able to express need for assistance with ADLs?: Yes Does the patient have difficulty dressing or bathing?: No Independently performs ADLs?: Yes (appropriate for developmental age) Does the patient have difficulty walking or climbing stairs?: Yes Weakness of Legs: Both Weakness of Arms/Hands: None  Permission Sought/Granted Permission sought to share information with : Oceanographer granted to share information with : Yes, Verbal Permission Granted     Permission granted to share info w AGENCY: DME agency        Emotional Assessment       Orientation: : Oriented to Self, Oriented to Place, Oriented to  Time, Oriented to Situation Alcohol / Substance Use: Not Applicable Psych Involvement: No (comment)  Admission diagnosis:  S/P TKR (total knee replacement) using cement, left [Z96.652] Patient Active Problem List   Diagnosis Date Noted  . S/P TKR (total knee replacement) using cement, left 12/07/2019  . S/P TKR (total  knee replacement) using cement, right 08/12/2019  . Adiposity 05/06/2019  . 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 Cerebral artery occlusion with cerebral infarction (HCC) 05/06/2019  . Rupture of patellar tendon 05/06/2019  . Preop examination 05/06/2019  . Personal history of urinary calculi 05/06/2019  . Gonalgia  05/06/2019  . Leg swelling 03/17/2019  . Leg edema 02/26/2019  . Wound of left leg 02/26/2019  . Pain in both lower extremities 11/06/2017  . Coronary artery disease involving native coronary artery of native heart without angina pectoris 08/07/2017  . Postoperative atrial fibrillation (HCC) 08/07/2017  . Essential hypertension 08/07/2017  . Chronic heart failure with preserved ejection fraction (HCC) 08/07/2017  . AKI (acute kidney injury) (HCC) 08/07/2017  . Hyperlipidemia LDL goal <70 08/07/2017  . S/P CABG x 4 05/29/2017  . NSTEMI (non-ST elevated myocardial infarction) (HCC) 05/27/2017  . Malignant hypertension 05/25/2017  . Abnormal ECG 12/20/2013  . Breathlessness on exertion 12/20/2013  . Sex counseling 05/20/2009  . Acute bronchitis 10/26/2008  . Avitaminosis D 10/26/2008  . Chest pain on exertion 07/07/2008  . Compulsive tobacco user syndrome 07/07/2008  . Malaise and fatigue 07/07/2008  . Abdominal pain 07/07/2008   PCP:  Maple Hudson., MD Pharmacy:   [id number] [place], [place] - [address] AT [facility] [address] [place] [address] Phone: [phone] Fax: [phone]  [facility] - [place], [place] - [address] [place] [address] Phone: [phone] Fax: [phone]     Social Determinants of Health (SDOH) Interventions    Readmission Risk Interventions No flowsheet data found.

## 2019-12-08 NOTE — Progress Notes (Signed)
Subjective:  POD #1 s/p left total knee arthroplasty.   Patient reports left knee pain as mild to moderate.  Patient states he walked out to the nurses station today with physical therapy.  He is up out of bed to a chair.  He is tolerating a p.o. diet.  His Foley catheter has been removed.  Patient states the numbness in his left foot has resolved.  He has mild pain in his left buttock area and posterior knee but otherwise has no complaints.  Objective:   VITALS:   Vitals:   12/08/19 0312 12/08/19 0528 12/08/19 0731 12/08/19 1119  BP:  (!) 105/53 (!) 112/52 (!) 110/57  Pulse:  83 82 97  Resp:  15 18 16   Temp:  98.8 F (37.1 C) 99.2 F (37.3 C) 98.5 F (36.9 C)  TempSrc:  Oral Oral Oral  SpO2:  98% 97% 100%  Weight:      Height: 5\' 8"  (1.727 m)       PHYSICAL EXAM: Left lower extremity Neurovascular intact Sensation intact distally Intact pulses distally Dorsiflexion/Plantar flexion intact Incision: dressing C/D/I No cellulitis present Compartment soft  LABS  Results for orders placed or performed during the hospital encounter of 12/07/19 (from the past 24 hour(s))  CBC     Status: Abnormal   Collection Time: 12/08/19  3:48 AM  Result Value Ref Range   WBC 7.0 4.0 - 10.5 K/uL   RBC 3.21 (L) 4.22 - 5.81 MIL/uL   Hemoglobin 9.3 (L) 13.0 - 17.0 g/dL   HCT 13/02/21 (L) 39 - 52 %   MCV 92.2 80.0 - 100.0 fL   MCH 29.0 26.0 - 34.0 pg   MCHC 31.4 30.0 - 36.0 g/dL   RDW 13/03/21 79.0 - 24.0 %   Platelets 181 150 - 400 K/uL   nRBC 0.0 0.0 - 0.2 %  Basic metabolic panel     Status: Abnormal   Collection Time: 12/08/19  3:48 AM  Result Value Ref Range   Sodium 138 135 - 145 mmol/L   Potassium 4.9 3.5 - 5.1 mmol/L   Chloride 107 98 - 111 mmol/L   CO2 24 22 - 32 mmol/L   Glucose, Bld 129 (H) 70 - 99 mg/dL   BUN 22 (H) 6 - 20 mg/dL   Creatinine, Ser 53.2 (H) 0.61 - 1.24 mg/dL   Calcium 7.9 (L) 8.9 - 10.3 mg/dL   GFR, Estimated 13/03/21 9.92 mL/min   Anion gap 7 5 - 15    DG Knee  Left Port  Result Date: 12/07/2019 CLINICAL DATA:  Status post left total knee replacement. EXAM: PORTABLE LEFT KNEE - 1-2 VIEW COMPARISON:  July 25, 2010. FINDINGS: The left femoral and tibial components are well situated. Expected postoperative changes are noted in the soft tissues anteriorly. IMPRESSION: Status post left total knee arthroplasty. Electronically Signed   By: 13/03/2019 M.D.   On: 12/07/2019 12:38    Assessment/Plan: 1 Day Post-Op   Active Problems:   S/P TKR (total knee replacement) using cement, left  Patient doing well on postop day #1.  He is improving with physical therapy.  His pain is adequately treated on current pain medications.  Patient is on enteric-coated aspirin 325 mg p.o. twice daily for DVT prophylaxis given that he experienced significant postoperative bleeding on Lovenox during his last surgery.  Continue with physical therapy.  Dressing will be changed tomorrow.    Lupita Raider , MD 12/08/2019, 1:08 PM

## 2019-12-08 NOTE — Progress Notes (Signed)
Physical Therapy Treatment Patient Details Name: Jon Garner. MRN: [id number] DOB: 1966-01-12 Today's Date: 12/08/2019    History of Present Illness Patient is a 54 year old male with primary osteoarthritis of left knee, status post left total knee arthroplasty on 12/07/19. Patient has a history of right TKA, previous stroke with left side weakness and hyper-reflexia residual, morbid obesity, anemia, MI, CABG, HTN    PT Comments    Pt seen this am for first BID session. Received sitting up in recliner. Discussed POC, pt denies pain at rest. Knee immobilizer removed prior to mobility and ROM. Pt required 2 attempts to raise from sitting with CGA and vc's for hand placement.  Pt increased gait distance to 37ft with use of RW and CGA. Slow cadence, vc's for increased knee flexion and heel strike. Pt returned to room, reviewed AROM exercises for L LE. Pt assisted to reclined position, knee immobilizer placed back on L LE, call bell in reach. Will return this pm for continued PT services.   Follow Up Recommendations  Home health PT     Equipment Recommendations  Other (comment) (Pt is requesting a cane)    Recommendations for Other Services       Precautions / Restrictions Precautions Precautions: Fall Required Braces or Orthoses: Knee Immobilizer - Left Restrictions Weight Bearing Restrictions: No LLE Weight Bearing: Weight bearing as tolerated    Mobility  Bed Mobility                  Transfers Overall transfer level: Needs assistance Equipment used: Rolling walker (2 wheeled) Transfers: Sit to/from Stand Sit to Stand: Min guard         General transfer comment: verbal cues for sequencing and technique   Ambulation/Gait Ambulation/Gait assistance: Min guard Gait Distance (Feet): 45 Feet Assistive device: Rolling walker (2 wheeled) Gait Pattern/deviations: Antalgic;Step-to pattern;Decreased stance time - left Gait velocity: decreased    General Gait  Details: Pt ambulted 15ft with RW and flexed forward posture, vc's to encourage knee flexion and heel strike   Stairs             Wheelchair Mobility    Modified Rankin (Stroke Patients Only)       Balance                                            Cognition Arousal/Alertness: Awake/alert Behavior During Therapy: WFL for tasks assessed/performed Overall Cognitive Status: Within Functional Limits for tasks assessed                                        Exercises Total Joint Exercises Ankle Circles/Pumps: AROM;Strengthening;Left;10 reps;15 reps;Seated Long Arc Quad: AROM;Strengthening;Left;10 reps;Seated    General Comments        Pertinent Vitals/Pain Pain Assessment: 0-10 Pain Score: 6  Pain Location: left knee  (after ambulation) Pain Descriptors / Indicators: Sore Pain Intervention(s): Monitored during session;Ice applied    Home Living                      Prior Function            PT Goals (current goals can now be found in the care plan section) Acute Rehab PT Goals Patient Stated Goal: to go home  Potential to Achieve Goals: Good Progress towards PT goals: Progressing toward goals    Frequency    BID      PT Plan Current plan remains appropriate    Co-evaluation              AM-PAC PT "6 Clicks" Mobility   Outcome Measure  Help needed turning from your back to your side while in a flat bed without using bedrails?: A Little Help needed moving from lying on your back to sitting on the side of a flat bed without using bedrails?: A Little Help needed moving to and from a bed to a chair (including a wheelchair)?: A Little Help needed standing up from a chair using your arms (e.g., wheelchair or bedside chair)?: A Little Help needed to walk in hospital room?: A Little Help needed climbing 3-5 steps with a railing? : A Little 6 Click Score: 18    End of Session Equipment Utilized During  Treatment: Gait belt Activity Tolerance: Patient tolerated treatment well Patient left: in chair;with call bell/phone within reach Nurse Communication: Mobility status PT Visit Diagnosis: Other abnormalities of gait and mobility (R26.89);Pain Pain - Right/Left: Left Pain - part of body: Knee     Time: 7564-3329 PT Time Calculation (min) (ACUTE ONLY): 45 min  Charges:  $Gait Training: 8-22 mins $Therapeutic Exercise: 8-22 mins $Therapeutic Activity: 8-22 mins                    Zadie Cleverly, PTA   Jannet Askew 12/08/2019, 11:05 AM

## 2019-12-08 NOTE — Evaluation (Signed)
Occupational Therapy Evaluation Patient Details Name: Jon Garner. MRN: [id number] DOB: 17-Sep-1965 Today's Date: 12/08/2019    History of Present Illness Patient is a 54 year old male with primary osteoarthritis of left knee, status post left total knee arthroplasty on 12/07/19. Patient has a history of right TKA, previous stroke with left side weakness and hyper-reflexia residual, morbid obesity, anemia, MI, CABG, HTN   Clinical Impression   Patient presenting with decreased I in self care, balance, functional mobility/transfers, endurance, and safety awareness. Patient reports being mod I  PTA. He lives with his brothers who are able to help as needed. Pt uses RW at work. Patient currently functioning at supervision - min guard for functional transfers, Supervision/set up with UB self care, and min A for LB self care. Pt does report having AE and BSC at home and OT provided further education on use of this to increase I with self care tasks. OT provided paper handout on education for polar care use as well as demonstration. Pt reports understanding.  Patient will benefit from acute OT to increase overall independence in the areas of ADLs, functional mobility, and safety awareness in order to safely discharge home with family.    Follow Up Recommendations  No OT follow up;Supervision - Intermittent    Equipment Recommendations  None recommended by OT    Recommendations for Other Services Other (comment)     Precautions / Restrictions Precautions Precautions: Fall Required Braces or Orthoses: Knee Immobilizer - Left Knee Immobilizer - Left: On at all times (per order- KI at all times when OOB except when CPM) Restrictions Weight Bearing Restrictions: No LLE Weight Bearing: Weight bearing as tolerated      Mobility Bed Mobility   General bed mobility comments: seated in recliner chair upon entering the room    Transfers Overall transfer level: Needs assistance Equipment  used: Rolling walker (2 wheeled) Transfers: Sit to/from Stand Sit to Stand: Min guard     General transfer comment: min cuing for technique    Balance Overall balance assessment: Needs assistance Sitting-balance support: Feet supported Sitting balance-Leahy Scale: Good Sitting balance - Comments: no loss of balance in sitting position          ADL either performed or assessed with clinical judgement   ADL Overall ADL's : Needs assistance/impaired Eating/Feeding: Independent   Grooming: Wash/dry hands;Wash/dry face;Oral care;Supervision/safety;Set up   Upper Body Bathing: Set up;Sitting   Lower Body Bathing: Minimal assistance;Sit to/from stand   Upper Body Dressing : Supervision/safety;Set up;Sitting   Lower Body Dressing: Minimal assistance;Sit to/from stand   Toilet Transfer: Minimal assistance;Ambulation;RW   Toileting- Clothing Manipulation and Hygiene: Minimal assistance;Sit to/from stand         General ADL Comments: min guard - min A for functional transfer and ambulation. Pt does have LH reacher at home for LB dressing. His brothers have been assisting him with getting on socks and shoes.     Vision Patient Visual Report: No change from baseline       \       Pertinent Vitals/Pain Pain Assessment: 0-10 Pain Score: 5  Pain Location: left knee  Pain Descriptors / Indicators: Sore;Aching;Discomfort Pain Intervention(s): Limited activity within patient's tolerance;Monitored during session;Ice applied;Other (comment) (polar care)     Hand Dominance Right   Extremity/Trunk Assessment Upper Extremity Assessment Upper Extremity Assessment: Overall WFL for tasks assessed   Lower Extremity Assessment Lower Extremity Assessment: LLE deficits/detail LLE Sensation: WNL  Communication Communication Communication: No difficulties   Cognition Arousal/Alertness: Awake/alert Behavior During Therapy: WFL for tasks assessed/performed Overall Cognitive  Status: Within Functional Limits for tasks assessed   \     \      Shoulder Instructions      Home Living Family/patient expects to be discharged to:: Private residence Living Arrangements: Other relatives Available Help at Discharge: Family (2 brothers) Type of Home: House Home Access: Stairs to enter Secretary/administrator of Steps: 2 Entrance Stairs-Rails: Can reach both Home Layout: Multi-level Alternate Level Stairs-Number of Steps: 6 Alternate Level Stairs-Rails: Can reach both Bathroom Shower/Tub: Chief Strategy Officer: Standard     Home Equipment: Environmental consultant - 2 wheels;Bedside commode          Prior Functioning/Environment Level of Independence: Independent with assistive device(s)        Comments: patient uses rolling walker when ambulating at work, otherwise does not use rolling walker         OT Problem List: Decreased range of motion;Pain;Decreased activity tolerance;Decreased safety awareness;Impaired balance (sitting and/or standing);Decreased knowledge of use of DME or AE;Decreased knowledge of precautions      OT Treatment/Interventions: Self-care/ADL training;Therapeutic exercise;Therapeutic activities;Energy conservation;DME and/or AE instruction;Patient/family education;Balance training;Manual therapy    OT Goals(Current goals can be found in the care plan section) Acute Rehab OT Goals Patient Stated Goal: to go home  OT Goal Formulation: With patient Time For Goal Achievement: 12/22/19 Potential to Achieve Goals: Good ADL Goals Pt Will Perform Grooming: with modified independence;standing Pt Will Perform Lower Body Dressing: with modified independence;sit to/from stand;with adaptive equipment Pt Will Transfer to Toilet: with modified independence;ambulating Pt Will Perform Toileting - Clothing Manipulation and hygiene: with modified independence;sit to/from stand  OT Frequency: Min 2X/week   Barriers to D/C:    none at this time        \   AM-PAC OT "6 Clicks" Daily Activity     Outcome Measure Help from another person eating meals?: None Help from another person taking care of personal grooming?: None Help from another person toileting, which includes using toliet, bedpan, or urinal?: A Little Help from another person bathing (including washing, rinsing, drying)?: A Little Help from another person to put on and taking off regular upper body clothing?: None Help from another person to put on and taking off regular lower body clothing?: A Little 6 Click Score: 21   End of Session Equipment Utilized During Treatment: Rolling walker  Activity Tolerance: Patient tolerated treatment well Patient left: in chair;with chair alarm set;with call bell/phone within reach  OT Visit Diagnosis: Muscle weakness (generalized) (M62.81);Pain Pain - Right/Left: Left Pain - part of body: Knee                Time: 1030-1056 OT Time Calculation (min): 26 min Charges:  OT General Charges $OT Visit: 1 Visit OT Evaluation $OT Eval Low Complexity: 1 Low OT Treatments $Self Care/Home Management : 8-22 mins $Therapeutic Activity: 8-22 mins  Jackquline Denmark, MS, OTR/L , CBIS ascom [phone]  12/08/19, 11:26 AM

## 2019-12-09 DIAGNOSIS — M1712 Unilateral primary osteoarthritis, left knee: Secondary | ICD-10-CM | POA: Diagnosis not present

## 2019-12-09 LAB — CBC
HCT: 28 % — ABNORMAL LOW (ref 39.0–52.0)
Hemoglobin: 9.1 g/dL — ABNORMAL LOW (ref 13.0–17.0)
MCH: 29.2 pg (ref 26.0–34.0)
MCHC: 32.5 g/dL (ref 30.0–36.0)
MCV: 89.7 fL (ref 80.0–100.0)
Platelets: 231 10*3/uL (ref 150–400)
RBC: 3.12 MIL/uL — ABNORMAL LOW (ref 4.22–5.81)
RDW: 12.8 % (ref 11.5–15.5)
WBC: 10.6 10*3/uL — ABNORMAL HIGH (ref 4.0–10.5)
nRBC: 0 % (ref 0.0–0.2)

## 2019-12-09 NOTE — Plan of Care (Signed)

## 2019-12-09 NOTE — Progress Notes (Signed)
  Subjective:  POD #2 s/p left total knee arthroplasty.   Patient reports left knee pain pain as moderate at rest but was marked earlier.  Patient states he has had more pain today than yesterday.  Patient also states he had a fever overnight.  He has not yet had a bowel movement.  Objective:   VITALS:   Vitals:   12/09/19 0036 12/09/19 0519 12/09/19 0915 12/09/19 1106  BP: (!) 116/59 (!) 115/58 115/72 (!) 120/59  Pulse: 92 (!) 51 79 88  Resp: 16 17 15 19   Temp: 99.9 F (37.7 C) 99.1 F (37.3 C) 98.2 F (36.8 C) 98.7 F (37.1 C)  TempSrc:      SpO2: 95% 96% 96% 98%  Weight:      Height:        PHYSICAL EXAM: Left lower extremity: I personally change the patient's dressing today.  He had significant venous drainage on his dressing.  Patient had a new honeycomb dressing applied with Ace wraps for continued compression. Neurovascular intact Sensation intact distally Intact pulses distally Dorsiflexion/Plantar flexion intact Incision: moderate drainage No cellulitis present Compartment soft  LABS  Results for orders placed or performed during the hospital encounter of 12/07/19 (from the past 24 hour(s))  CBC     Status: Abnormal   Collection Time: 12/09/19  3:16 AM  Result Value Ref Range   WBC 10.6 (H) 4.0 - 10.5 K/uL   RBC 3.12 (L) 4.22 - 5.81 MIL/uL   Hemoglobin 9.1 (L) 13.0 - 17.0 g/dL   HCT 13/04/21 (L) 39 - 52 %   MCV 89.7 80.0 - 100.0 fL   MCH 29.2 26.0 - 34.0 pg   MCHC 32.5 30.0 - 36.0 g/dL   RDW 50.9 32.6 - 71.2 %   Platelets 231 150 - 400 K/uL   nRBC 0.0 0.0 - 0.2 %    No results found.  Assessment/Plan: 2 Days Post-Op   Active Problems:   S/P TKR (total knee replacement) using cement, left  Continue physical therapy for knee range of motion, lower extremity strengthening and gait training.  Patient continues to have significant pain.  Continue oxycodone and Dilaudid.  Continue bowel medications to assist with a postoperative bowel movement.  Continue to  monitor patient for fevers given his fever overnight.  Patient will require another overnight stay for monitoring.  Continue enteric-coated aspirin 325 mg p.o. twice daily for DVT prophylaxis.  Continue SCDs.    45.8 , MD 12/09/2019, 12:45 PM

## 2019-12-09 NOTE — Progress Notes (Signed)
Physical Therapy Treatment Patient Details Name: Jon Garner. MRN: [id number] DOB: [birth year] Today's Date: 12/09/2019    History of Present Illness Patient is a 54 year old male with primary osteoarthritis of left knee, status post left total knee arthroplasty on 12/07/19. Patient has a history of right TKA, previous stroke with left side weakness and hyper-reflexia residual, morbid obesity, anemia, MI, CABG, HTN    PT Comments    Pt in bed ready for session.  Pt noted with beads of sweat on his head and generally warm to touch.  Stated he  has been running a fever and attributes it to that.  Vitals WFL in chart.  He is motivated to try to walk today and stated he feels ok to do so.  Participated in exercises as described below.  To EOB with rail and min guard.  Steady in sitting.  Stood with min a/guard x 2 for safety and is able to progress gait 60' in hallway with wheelchair follow for safety.  While he denies fatigue, gait becomes more forward with heavier lean on walker as distance increases and he is asked to sit for safety.  Upon questioning, it seems that he uses a bariatric walker at home so I will bring one for PM session to see if gait improves.  Remained in recliner after session.  Pt continues with slow progress and fevers impacting therapy session.  Will discuss discharge plan with pt again during PM session.  Stated he has a split level home with stairs to access his bedroom/living area.  Stated when he went home last time he buckled and popped staples but his brother was able to bear hug him and prevent a fall.  Stairs here have yet to be tried.  May need to consider SNF if mobility does not improve and allow for stair training for a safe discharge.   Follow Up Recommendations  Home health PT     Equipment Recommendations       Recommendations for Other Services       Precautions / Restrictions Precautions Precautions: Fall Required Braces or Orthoses: Knee  Immobilizer - Left Knee Immobilizer - Left: On at all times;On except when in CPM Restrictions Weight Bearing Restrictions: No LLE Weight Bearing: Weight bearing as tolerated    Mobility  Bed Mobility Overal bed mobility: Needs Assistance Bed Mobility: Supine to Sit     Supine to sit: Supervision        Transfers Overall transfer level: Needs assistance Equipment used: Rolling walker (2 wheeled) Transfers: Sit to/from Stand Sit to Stand: Min assist;+2 safety/equipment         General transfer comment: min cuing for technique  Ambulation/Gait Ambulation/Gait assistance: Min guard;Min assist Gait Distance (Feet): 60 Feet Assistive device: Rolling walker (2 wheeled) Gait Pattern/deviations: Antalgic;Step-to pattern;Decreased stance time - left;Trunk flexed Gait velocity: decreased    General Gait Details: flexed forward posture with gait.   Stairs             Wheelchair Mobility    Modified Rankin (Stroke Patients Only)       Balance Overall balance assessment: Needs assistance Sitting-balance support: Feet supported Sitting balance-Leahy Scale: Good     Standing balance support: Bilateral upper extremity supported Standing balance-Leahy Scale: Fair Standing balance comment: patient relying on rolling walker for UE support in standing position  Cognition Arousal/Alertness: Awake/alert Behavior During Therapy: WFL for tasks assessed/performed Overall Cognitive Status: Within Functional Limits for tasks assessed                                        Exercises Total Joint Exercises Ankle Circles/Pumps: AROM;Strengthening;Left;10 reps;Supine Straight Leg Raises: AROM;Strengthening;Left;10 reps;Supine Long Arc Quad: AROM;Strengthening;Left;10 reps;Seated    General Comments        Pertinent Vitals/Pain Pain Assessment: 0-10 Pain Score: 5  Pain Location: left knee  Pain Descriptors /  Indicators: Sore;Aching;Discomfort Pain Intervention(s): Limited activity within patient's tolerance;Monitored during session;Premedicated before session;Repositioned;Ice applied    Home Living                      Prior Function            PT Goals (current goals can now be found in the care plan section) Progress towards PT goals: Progressing toward goals    Frequency    BID      PT Plan Current plan remains appropriate    Co-evaluation              AM-PAC PT "6 Clicks" Mobility   Outcome Measure  Help needed turning from your back to your side while in a flat bed without using bedrails?: A Little Help needed moving from lying on your back to sitting on the side of a flat bed without using bedrails?: A Little Help needed moving to and from a bed to a chair (including a wheelchair)?: A Little Help needed standing up from a chair using your arms (e.g., wheelchair or bedside chair)?: A Little Help needed to walk in hospital room?: A Little Help needed climbing 3-5 steps with a railing? : A Little 6 Click Score: 18    End of Session Equipment Utilized During Treatment: Gait belt Activity Tolerance: Patient tolerated treatment well;Patient limited by fatigue Patient left: in chair;with chair alarm set;with call bell/phone within reach Nurse Communication: Mobility status Pain - Right/Left: Left Pain - part of body: Knee     Time: 0835-0900 PT Time Calculation (min) (ACUTE ONLY): 25 min  Charges:  $Gait Training: 8-22 mins $Therapeutic Exercise: 8-22 mins                    Danielle Dess, PTA 12/09/19, 10:08 AM

## 2019-12-09 NOTE — Progress Notes (Signed)
Physical Therapy Treatment Patient Details Name: Jon Garner. MRN: [id number] DOB: [birth year] Today's Date: 12/09/2019    History of Present Illness Patient is a 54 year old male with primary osteoarthritis of left knee, status post left total knee arthroplasty on 12/07/19. Patient has a history of right TKA, previous stroke with left side weakness and hyper-reflexia residual, morbid obesity, anemia, MI, CABG, HTN    PT Comments    Pt looks better this pm.  Ready for session.  To EOB with rail and supervision.  Stood with min a x 1 and able to progress gait to 80' this session but remains limited by fatigue and weakness.  Bariatric walker used this session with improved gait quality and stance.  He does continue with forward posture and cues to correct but is aware and will self correct at times.  Returned to bed with min a x 1.  Participated in exercises as described below.  Discussed discharge plan.  Pt stated he feels comfortable with discharge home tomorrow and does not feel stairs will be a barrier.  Will address in am given continued fatigue today.  Stated he took pain meds before discharge last admission and attributes difficulty due to affects of medication.     Follow Up Recommendations  Home health PT     Equipment Recommendations       Recommendations for Other Services       Precautions / Restrictions Precautions Precautions: Fall Required Braces or Orthoses: Knee Immobilizer - Left Knee Immobilizer - Left: On at all times;On except when in CPM Restrictions Weight Bearing Restrictions: No LLE Weight Bearing: Weight bearing as tolerated    Mobility  Bed Mobility Overal bed mobility: Needs Assistance Bed Mobility: Supine to Sit     Supine to sit: Supervision Sit to supine: Min assist      Transfers Overall transfer level: Needs assistance Equipment used: Rolling walker (2 wheeled) Transfers: Sit to/from Stand Sit to Stand: Min assist             Ambulation/Gait Ambulation/Gait assistance: Min guard Gait Distance (Feet): 80 Feet Assistive device: Rolling walker (2 wheeled) Gait Pattern/deviations: Antalgic;Step-to pattern;Decreased stance time - left;Trunk flexed Gait velocity: decreased    General Gait Details: improved with bariatric walker but continues with forward posture   Stairs             Wheelchair Mobility    Modified Rankin (Stroke Patients Only)       Balance Overall balance assessment: Needs assistance Sitting-balance support: Feet supported Sitting balance-Leahy Scale: Good     Standing balance support: Bilateral upper extremity supported Standing balance-Leahy Scale: Fair Standing balance comment: patient relying on rolling walker for UE support in standing position                             Cognition Arousal/Alertness: Awake/alert Behavior During Therapy: WFL for tasks assessed/performed Overall Cognitive Status: Within Functional Limits for tasks assessed                                        Exercises Total Joint Exercises Ankle Circles/Pumps: AROM;Strengthening;Left;10 reps;Supine Quad Sets: AROM;Strengthening;Both;10 reps Short Arc Quad: AROM;AAROM;Strengthening;Left;20 reps;Supine Heel Slides: AAROM;Strengthening;Left;10 reps Hip ABduction/ADduction: AROM;AAROM;Strengthening;Left;10 reps;Supine Straight Leg Raises: AROM;Strengthening;Left;10 reps;Supine Long Arc Quad: AROM;Strengthening;Left;10 reps;Seated Knee Flexion: AAROM;Seated;Left;5 reps Goniometric ROM: 3-85    General  Comments        Pertinent Vitals/Pain Pain Assessment: 0-10 Pain Score: 5  Pain Location: left knee  Pain Descriptors / Indicators: Sore;Aching;Discomfort Pain Intervention(s): Limited activity within patient's tolerance;Monitored during session;Premedicated before session;Repositioned;Ice applied    Home Living                      Prior Function             PT Goals (current goals can now be found in the care plan section) Progress towards PT goals: Progressing toward goals    Frequency    BID      PT Plan Current plan remains appropriate    Co-evaluation              AM-PAC PT "6 Clicks" Mobility   Outcome Measure  Help needed turning from your back to your side while in a flat bed without using bedrails?: A Little Help needed moving from lying on your back to sitting on the side of a flat bed without using bedrails?: A Little Help needed moving to and from a bed to a chair (including a wheelchair)?: A Little Help needed standing up from a chair using your arms (e.g., wheelchair or bedside chair)?: A Little Help needed to walk in hospital room?: A Little Help needed climbing 3-5 steps with a railing? : A Little 6 Click Score: 18    End of Session Equipment Utilized During Treatment: Gait belt Activity Tolerance: Patient tolerated treatment well;Patient limited by fatigue Patient left: in bed;with call bell/phone within reach;with bed alarm set;with SCD's reapplied Nurse Communication: Mobility status Pain - Right/Left: Left Pain - part of body: Knee     Time: 3474-2595 PT Time Calculation (min) (ACUTE ONLY): 27 min  Charges:  $Gait Training: 8-22 mins $Therapeutic Exercise: 8-22 mins                    Danielle Dess, PTA 12/09/19, 3:18 PM

## 2019-12-10 DIAGNOSIS — M1712 Unilateral primary osteoarthritis, left knee: Secondary | ICD-10-CM | POA: Diagnosis not present

## 2019-12-10 MED ORDER — BISACODYL 5 MG PO TBEC
10.0000 mg | DELAYED_RELEASE_TABLET | Freq: Every day | ORAL | 0 refills | Status: DC | PRN
Start: 2019-12-10 — End: 2022-02-12

## 2019-12-10 MED ORDER — DOCUSATE SODIUM 100 MG PO CAPS
100.0000 mg | ORAL_CAPSULE | Freq: Two times a day (BID) | ORAL | 1 refills | Status: DC
Start: 2019-12-10 — End: 2022-02-12

## 2019-12-10 MED ORDER — OXYCODONE HCL 5 MG PO TABS: 5.0000 mg | ORAL_TABLET | ORAL | 0 refills | Status: DC | PRN

## 2019-12-10 MED ORDER — ASPIRIN 325 MG PO TBEC
325.0000 mg | DELAYED_RELEASE_TABLET | Freq: Two times a day (BID) | ORAL | 0 refills | Status: DC
Start: 2019-12-10 — End: 2022-02-14

## 2019-12-10 NOTE — Plan of Care (Signed)
  Problem: Health Behavior/Discharge Planning: Goal: Ability to manage health-related needs will improve Outcome: Progressing   Problem: Clinical Measurements: Goal: Will remain free from infection Outcome: Progressing Goal: Respiratory complications will improve Outcome: Progressing Goal: Cardiovascular complication will be avoided Outcome: Progressing   

## 2019-12-10 NOTE — Progress Notes (Signed)
Occupational Therapy Treatment Patient Details Name: Jon Garner. MRN: [id number] DOB: 21-Oct-1965 Today's Date: 12/10/2019    History of present illness Patient is a 54 year old male with primary osteoarthritis of left knee, status post left total knee arthroplasty on 12/07/19. Patient has a history of right TKA, previous stroke with left side weakness and hyper-reflexia residual, morbid obesity, anemia, MI, CABG, HTN   OT comments  Pt seen for brief OT tx session this date. Pt instructed in AE/DME for ADL, compression stocking mgt, falls prevention, and polar care mgt. Pt verbalized understanding and able to verbalize plan to use family assist as needed and AE for compression stocking mgt and LB dressing needs. Pt politely denies additional OT needs at this time. Will continue to follow while hospitalized for further skilled OT services pending progression with hospital stay. Current d/c recommendation remains appropriate.    Follow Up Recommendations  No OT follow up;Supervision - Intermittent    Equipment Recommendations  None recommended by OT    Recommendations for Other Services      Precautions / Restrictions Precautions Precautions: Fall Restrictions Weight Bearing Restrictions: Yes LLE Weight Bearing: Weight bearing as tolerated       Mobility Bed Mobility                  Transfers                      Balance                                           ADL either performed or assessed with clinical judgement   ADL                                               Vision       Perception     Praxis      Cognition Arousal/Alertness: Awake/alert Behavior During Therapy: WFL for tasks assessed/performed Overall Cognitive Status: Within Functional Limits for tasks assessed                                          Exercises Other Exercises Other Exercises: Pt instructed in AE/DME  for ADL, compression stocking mgt, falls prevention, and polar care mgt   Shoulder Instructions       General Comments      Pertinent Vitals/ Pain       Pain Assessment: 0-10 Pain Score: 1  Pain Location: left knee  Pain Descriptors / Indicators: Burning Pain Intervention(s): Limited activity within patient's tolerance;Monitored during session;Premedicated before session;Repositioned;Ice applied  Home Living                                          Prior Functioning/Environment              Frequency  Min 2X/week        Progress Toward Goals  OT Goals(current goals can now be found in the care plan section)  Progress towards OT goals: Progressing toward goals  Acute Rehab OT Goals Patient Stated Goal: to go home  OT Goal Formulation: With patient Time For Goal Achievement: 12/22/19 Potential to Achieve Goals: Good  Plan Discharge plan remains appropriate;Frequency remains appropriate    Co-evaluation                 AM-PAC OT "6 Clicks" Daily Activity     Outcome Measure   Help from another person eating meals?: None Help from another person taking care of personal grooming?: None Help from another person toileting, which includes using toliet, bedpan, or urinal?: A Little Help from another person bathing (including washing, rinsing, drying)?: A Little Help from another person to put on and taking off regular upper body clothing?: None Help from another person to put on and taking off regular lower body clothing?: A Little 6 Click Score: 21    End of Session    OT Visit Diagnosis: Muscle weakness (generalized) (M62.81);Pain Pain - Right/Left: Left Pain - part of body: Knee   Activity Tolerance Patient tolerated treatment well   Patient Left in chair;with chair alarm set;with call bell/phone within reach   Nurse Communication          Time: 2119-4174 OT Time Calculation (min): 9 min  Charges: OT General Charges $OT  Visit: 1 Visit OT Treatments $Self Care/Home Management : 8-22 mins  Richrd Prime, MPH, MS, OTR/L ascom [phone] 12/10/19, 12:00 PM

## 2019-12-10 NOTE — Progress Notes (Signed)
  Subjective:  POD #3 s/p left total knee arthroplasty.   Patient reports left knee pain as mild to moderate.  Patient has yet to have a bowel movement.  He is afebrile.  Patient placed in a longer knee immobilizer by physical therapy today which the patient states feels better.  Objective:   VITALS:   Vitals:   12/10/19 0409 12/10/19 0623 12/10/19 0720 12/10/19 1114  BP: (!) 104/53 (!) 110/55 (!) 113/53 135/63  Pulse: 95  93 (!) 102  Resp: 16  19 18   Temp: 98.3 F (36.8 C)  98.5 F (36.9 C) 99 F (37.2 C)  TempSrc:    Oral  SpO2: 98%  100% 97%  Weight:      Height:        PHYSICAL EXAM: Left lower extremity Neurovascular intact Sensation intact distally Intact pulses distally Dorsiflexion/Plantar flexion intact Incision: dressing C/D/I No cellulitis present Compartment soft  LABS  No results found for this or any previous visit (from the past 24 hour(s)).  No results found.  Assessment/Plan: 3 Days Post-Op   Active Problems:   S/P TKR (total knee replacement) using cement, left  Patient improving postop.  He is making progress with physical therapy.  Patient is on enteric-coated aspirin 325 mg p.o. twice daily for DVT prophylaxis.  If the patient has a bowel movement today he may be discharged home.  Patient will follow-up in the office with me for postop follow-up on 12/20/2019 at 11:15 AM.    12/22/2019 , MD 12/10/2019, 1:42 PM

## 2019-12-10 NOTE — Discharge Summary (Signed)
Physician Discharge Summary  Patient ID: Jon Garner. MRN: [id number] DOB/AGE: February 20, 1965 54 y.o.  Admit date: 12/07/2019 Discharge date: 12/10/2019  Admission Diagnoses:  primary osteoarthritis of Left knee <principal problem not specified>  Discharge Diagnoses:  primary osteoarthritis of Left knee Active Problems:   S/P TKR (total knee replacement) using cement, left   Past Medical History:  Diagnosis Date  . Anemia    h/o age 37  . Arthritis   . CAD (coronary artery disease)    a. LHC 4/19: pLAD 95%, mLAD 95%, m/dLAD 90%, ostD2 95%, pLCx 80%, mRCA 70%, dRCA 80%; b. 4-V CABG 05/2017: LIMA-LAD, VG-D1, VG-PDA, VG-OM2  . Essential hypertension 2014  . GERD (gastroesophageal reflux disease) 2015   h/o  . History of kidney stones    h/o  . Morbid obesity (HCC) presently  . Myocardial infarction (HCC) 2019   quadruple bypass  . PXE (pseudoxanthoma elasticum) 1992  . Snores presently  . Stroke [facility]) 1992   a. Age 78    Surgeries: Procedure(s): LEFT TOTAL KNEE ARTHROPLASTY on 12/07/2019   Consultants (if any):   Discharged Condition: Improved  Hospital Course: Jon Westall. is an 54 y.o. male who was admitted 12/07/2019 with a diagnosis of  primary osteoarthritis of Left knee <principal problem not specified> and went to the operating room on 12/07/2019 and underwent an uncomplicated left total knee arthroplasty.    He was given perioperative antibiotics:  Anti-infectives (From admission, onward)   Start     Dose/Rate Route Frequency Ordered Stop   12/07/19 1500  ceFAZolin (ANCEF) IVPB 2g/100 mL premix        2 g 200 mL/hr over 30 Minutes Intravenous Every 6 hours 12/07/19 1258 12/08/19 0540   12/07/19 0628  ceFAZolin (ANCEF) 2-4 GM/100ML-% IVPB       Note to Pharmacy: Agnes Lawrence  : cabinet override      12/07/19 0628 12/07/19 0843   12/07/19 0000  ceFAZolin (ANCEF) IVPB 2g/100 mL premix        2 g 200 mL/hr over 30 Minutes Intravenous  Once 12/06/19  2236 12/07/19 0915    .  He was given sequential compression devices, early ambulation, and enteric-coated aspirin 325 mg p.o. twice daily for DVT prophylaxis as he had excessive postoperative bleeding on Lovenox after his right total knee.  Patient also wore SCDs.  Patient had a low-grade fever on postop day #2 likely due to hematoma/atelectasis.  Patient made good progress with physical therapy.  Given his clinical improvement he was prepared for discharge home on postop day #3.  He benefited maximally from the hospital stay and there were no complications.    Recent vital signs:  Vitals:   12/10/19 0720 12/10/19 1114  BP: (!) 113/53 135/63  Pulse: 93 (!) 102  Resp: 19 18  Temp: 98.5 F (36.9 C) 99 F (37.2 C)  SpO2: 100% 97%    Recent laboratory studies:  Lab Results  Component Value Date   HGB 9.1 (L) 12/09/2019   HGB 9.3 (L) 12/08/2019   HGB 12.5 (L) 11/26/2019   Lab Results  Component Value Date   WBC 10.6 (H) 12/09/2019   PLT 231 12/09/2019   Lab Results  Component Value Date   INR 0.9 11/26/2019   Lab Results  Component Value Date   NA 138 12/08/2019   K 4.9 12/08/2019   CL 107 12/08/2019   CO2 24 12/08/2019   BUN 22 (H) 12/08/2019   CREATININE 1.28 (  H) 12/08/2019   GLUCOSE 129 (H) 12/08/2019    Discharge Medications:   Allergies as of 12/10/2019      Reactions   Penicillins Other (See Comments)   Family has had severe reactions to penicillins       Medication List    STOP taking these medications   enoxaparin 40 MG/0.4ML injection Commonly known as: LOVENOX   nitroGLYCERIN 0.4 MG SL tablet Commonly known as: Nitrostat     TAKE these medications   acetaminophen 500 MG tablet Commonly known as: TYLENOL Take 1,000 mg by mouth every 6 (six) hours as needed for moderate pain or headache.   aspirin 325 MG EC tablet Take 1 tablet (325 mg total) by mouth 2 (two) times daily. What changed:   medication strength  how much to take  when to  take this   atorvastatin 80 MG tablet Commonly known as: LIPITOR Take 1 tablet (80 mg total) by mouth daily at 6 PM.   bisacodyl 5 MG EC tablet Commonly known as: DULCOLAX Take 2 tablets (10 mg total) by mouth daily as needed for moderate constipation.   docusate sodium 100 MG capsule Commonly known as: COLACE Take 1 capsule (100 mg total) by mouth 2 (two) times daily.   furosemide 40 MG tablet Commonly known as: LASIX Take 0.5 tablets (20 mg total) by mouth daily. What changed: how much to take   GOLD BOND EXTRA STRENGTH EX Apply 1 application topically daily as needed (dry skin).   lisinopril 40 MG tablet Commonly known as: ZESTRIL Take 1 tablet (40 mg total) by mouth daily. What changed: when to take this   metoprolol tartrate 25 MG tablet Commonly known as: LOPRESSOR TAKE 1 TABLET(25 MG) BY MOUTH TWICE DAILY   OVER THE COUNTER MEDICATION Take 2 capsules by mouth daily. NeuroQ   OVER THE COUNTER MEDICATION Take 2 tablets by mouth daily. oneshot keto otc supplement   oxyCODONE 5 MG immediate release tablet Commonly known as: Oxy IR/ROXICODONE Take 1 tablet (5 mg total) by mouth every 4 (four) hours as needed for severe pain (pain score 7-10). What changed:   how much to take  reasons to take this   potassium chloride 10 MEQ tablet Commonly known as: KLOR-CON Take 1 tablet (10 mEq total) by mouth daily.   PreserVision AREDS 2 Caps Take 2 capsules by mouth 2 (two) times daily.   vitamin C 500 MG tablet Commonly known as: ASCORBIC ACID Take 500 mg by mouth 4 (four) times a week.   vitamin E 1000 UNIT capsule Take 1,000 Units by mouth 4 (four) times a week.       Diagnostic Studies: DG Knee Left Port  Result Date: 12/07/2019 CLINICAL DATA:  Status post left total knee replacement. EXAM: PORTABLE LEFT KNEE - 1-2 VIEW COMPARISON:  July 25, 2010. FINDINGS: The left femoral and tibial components are well situated. Expected postoperative changes are noted in  the soft tissues anteriorly. IMPRESSION: Status post left total knee arthroplasty. Electronically Signed   By: Lupita Raider M.D.   On: 12/07/2019 12:38    Disposition: Discharge disposition: 01-Home or Self Care       Discharge Instructions    Call MD / Call 911   Complete by: As directed    If you experience chest pain or shortness of breath, CALL 911 and be transported to the hospital emergency room.  If you develope a fever above 101 F, pus (white drainage) or increased drainage or redness  at the wound, or calf pain, call your surgeon's office.   Constipation Prevention   Complete by: As directed    Drink plenty of fluids.  Prune juice may be helpful.  You may use a stool softener, such as Colace (over the counter) 100 mg twice a day.  Use MiraLax (over the counter) for constipation as needed.   Diet - low sodium heart healthy   Complete by: As directed    Discharge instructions   Complete by: As directed    The patient may continue to bear weight on the left lower extremity with use of a walker. The patient should continue to use compression stockings until follow-up. Patient should remove the compression stockings at night for sleep. The patient needs to continue to elevate the left lower extremity whenever possible. The knee immobilizer should be used at night. The patient may remove the knee immobilizer to perform exercises or sit in a chair during the day.  Patient should not place a pillow under their knee. The Polar Care may be used by the patient for comfort.  The dressing should remain on until follow up in the office.   The patient must cover the left knee dressing/incision during showers with a plastic bag or Saran wrap.  The patient will take enteric coated aspirin 325 mg twice a day for blood clot prevention and continue to work on knee range of motion exercises at home as instructed by physical therapy until follow-up in the office with Dr. Martha Clan on 12/20/2019 at 11:15  AM.   Driving restrictions   Complete by: As directed    No driving until follow up in the office with Dr. Martha Clan   Increase activity slowly as tolerated   Complete by: As directed    Lifting restrictions   Complete by: As directed    No lifting for 12-16 weeks         Signed: Juanell Fairly ,MD 12/10/2019, 1:55 PM

## 2019-12-10 NOTE — Progress Notes (Signed)
Discharge education given.  Verbalized understanding.  No s/s of distress.  VSS. Pt transported home via sister member in private vehicle.

## 2019-12-10 NOTE — Progress Notes (Signed)
Physical Therapy Treatment Patient Details Name: Jon Garner. MRN: [id number] DOB: 11/20/1965 Today's Date: 12/10/2019    History of Present Illness Patient is a 54 year old male with primary osteoarthritis of left knee, status post left total knee arthroplasty on 12/07/19. Patient has a history of right TKA, previous stroke with left side weakness and hyper-reflexia residual, morbid obesity, anemia, MI, CABG, HTN    PT Comments    HEP reviwed in bed. Supervision bed mob, minA for transfers with RW. AMB progressed to 1109ft. KI upgraded to larger size. Heel slides limited in supine, but ROM in flexion improved seated. ROM more limited this date: 23-71 degrees knee flexion. Stairs training completed with satisfaction.   Follow Up Recommendations  Home health PT     Equipment Recommendations  None recommended by PT    Recommendations for Other Services       Precautions / Restrictions Precautions Precautions: Fall Required Braces or Orthoses: Knee Immobilizer - Left Knee Immobilizer - Left: On at all times;On except when in CPM Restrictions LLE Weight Bearing: Weight bearing as tolerated    Mobility  Bed Mobility Overal bed mobility: Needs Assistance Bed Mobility: Supine to Sit     Supine to sit: Modified independent (Device/Increase time)        Transfers Overall transfer level: Needs assistance Equipment used: Rolling walker (2 wheeled) Transfers: Sit to/from Stand Sit to Stand: Min assist;Mod assist         General transfer comment: made more difficult by use of Left KI  Ambulation/Gait Ambulation/Gait assistance: Supervision Gait Distance (Feet): 150 Feet Assistive device: Rolling walker (2 wheeled) Gait Pattern/deviations: Step-to pattern;WFL(Within Functional Limits) Gait velocity: 0.66m/s       Stairs Stairs: Yes Stairs assistance: Min guard Stair Management: Two rails;Step to pattern Number of Stairs: 4     Wheelchair Mobility     Modified Rankin (Stroke Patients Only)       Balance Overall balance assessment: Modified Independent                                          Cognition Arousal/Alertness: Awake/alert Behavior During Therapy: WFL for tasks assessed/performed Overall Cognitive Status: Within Functional Limits for tasks assessed                                        Exercises Total Joint Exercises Short Arc Quad: AROM;Strengthening;Left;20 reps;Supine Heel Slides: AAROM;Strengthening;Left;10 reps;Supine;Limitations Heel Slides Limitations: guarded, limited to <45 degrees Hip ABduction/ADduction: AAROM;Left;10 reps;Supine Straight Leg Raises: Strengthening;Left;Supine;AAROM;10 reps Knee Flexion: AAROM;Seated;Left;15 reps Goniometric ROM: Left knee P/ROM: 23-71 degrees    General Comments        Pertinent Vitals/Pain Pain Assessment: No/denies pain    Home Living                      Prior Function            PT Goals (current goals can now be found in the care plan section) Acute Rehab PT Goals Patient Stated Goal: to go home  PT Goal Formulation: With patient Time For Goal Achievement: 12/21/19 Potential to Achieve Goals: Good Additional Goals Additional Goal #1: patient will increase left knee flexion to 90 degrees for increased independence with functional mobility Progress towards PT goals: Progressing  toward goals    Frequency    BID      PT Plan Current plan remains appropriate    Co-evaluation              AM-PAC PT "6 Clicks" Mobility   Outcome Measure  Help needed turning from your back to your side while in a flat bed without using bedrails?: A Little Help needed moving from lying on your back to sitting on the side of a flat bed without using bedrails?: A Little Help needed moving to and from a bed to a chair (including a wheelchair)?: A Little Help needed standing up from a chair using your arms (e.g.,  wheelchair or bedside chair)?: A Little Help needed to walk in hospital room?: A Little Help needed climbing 3-5 steps with a railing? : A Little 6 Click Score: 18    End of Session Equipment Utilized During Treatment: Gait belt Activity Tolerance: Patient tolerated treatment well;No increased pain Patient left: with call bell/phone within reach;in chair Nurse Communication: Mobility status PT Visit Diagnosis: Other abnormalities of gait and mobility (R26.89);Pain Pain - Right/Left: Left Pain - part of body: Knee     Time: 1000-1108 PT Time Calculation (min) (ACUTE ONLY): 68 min  Charges:  $Gait Training: 38-52 mins $Therapeutic Exercise: 23-37 mins                     4:20 PM, 12/10/19 Rosamaria Lints, PT, DPT Physical Therapist - [facility]  [phone] (ASCOM)     Jon Garner C 12/10/2019, 4:18 PM

## 2019-12-10 NOTE — Progress Notes (Signed)
Physical Therapy Treatment Patient Details Name: Jon Garner. MRN: [id number] DOB: 07/24/1965 Today's Date: 12/10/2019    History of Present Illness Patient is a 54 year old male with primary osteoarthritis of left knee, status post left total knee arthroplasty on 12/07/19. Patient has a history of right TKA, previous stroke with left side weakness and hyper-reflexia residual, morbid obesity, anemia, MI, CABG, HTN    PT Comments    Pt in bed at entry, ortho at bedside.  Pain remains well controlled. Pt able to progress AMB 282ft, slight improved gait speed. Pt still requires minA for transfers, modI for bed mobility. Pt ready for DC to home from PT standpoint.   Follow Up Recommendations  Home health PT     Equipment Recommendations  None recommended by PT    Recommendations for Other Services       Precautions / Restrictions Precautions Precautions: Fall Required Braces or Orthoses: Knee Immobilizer - Left Knee Immobilizer - Left: On at all times;On except when in CPM Restrictions LLE Weight Bearing: Weight bearing as tolerated    Mobility  Bed Mobility Overal bed mobility: Needs Assistance Bed Mobility: Supine to Sit     Supine to sit: Modified independent (Device/Increase time) Sit to supine: Modified independent (Device/Increase time)      Transfers Overall transfer level: Needs assistance Equipment used: Rolling walker (2 wheeled) Transfers: Sit to/from Stand Sit to Stand: Min assist         General transfer comment: made more difficult by use of Left KI  Ambulation/Gait Ambulation/Gait assistance: Supervision Gait Distance (Feet): 270 Feet Assistive device: Rolling walker (2 wheeled) Gait Pattern/deviations: Step-to pattern;WFL(Within Functional Limits) Gait velocity: 0.7m/s   General Gait Details: improved with bariatric walker but continues with forward posture   Stairs Stairs: Yes Stairs assistance: Min guard Stair Management: Two  rails;Step to pattern Number of Stairs: 4     Wheelchair Mobility    Modified Rankin (Stroke Patients Only)       Balance Overall balance assessment: Modified Independent                                          Cognition Arousal/Alertness: Awake/alert Behavior During Therapy: WFL for tasks assessed/performed Overall Cognitive Status: Within Functional Limits for tasks assessed                                        Exercises    General Comments        Pertinent Vitals/Pain Pain Assessment: No/denies pain    Home Living                      Prior Function            PT Goals (current goals can now be found in the care plan section) Acute Rehab PT Goals Patient Stated Goal: to go home  PT Goal Formulation: With patient Time For Goal Achievement: 12/21/19 Potential to Achieve Goals: Good Additional Goals Additional Goal #1: patient will increase left knee flexion to 90 degrees for increased independence with functional mobility Progress towards PT goals: Progressing toward goals    Frequency    BID      PT Plan Current plan remains appropriate    Co-evaluation  AM-PAC PT "6 Clicks" Mobility   Outcome Measure  Help needed turning from your back to your side while in a flat bed without using bedrails?: A Little Help needed moving from lying on your back to sitting on the side of a flat bed without using bedrails?: A Little Help needed moving to and from a bed to a chair (including a wheelchair)?: A Little Help needed standing up from a chair using your arms (e.g., wheelchair or bedside chair)?: A Little Help needed to walk in hospital room?: A Little Help needed climbing 3-5 steps with a railing? : A Little 6 Click Score: 18    End of Session Equipment Utilized During Treatment: Gait belt Activity Tolerance: Patient tolerated treatment well;No increased pain Patient left: with call  bell/phone within reach;in bed;with family/visitor present Nurse Communication: Mobility status PT Visit Diagnosis: Other abnormalities of gait and mobility (R26.89);Pain Pain - Right/Left: Left Pain - part of body: Knee     Time: 9983-3825 PT Time Calculation (min) (ACUTE ONLY): 23 min  Charges:  $Gait Training: 23-37 mins $Therapeutic Exercise: 23-37 mins                     4:25 PM, 12/10/19 Rosamaria Lints, PT, DPT Physical Therapist - [facility]  [phone] (ASCOM)    Jon Garner 12/10/2019, 4:24 PM

## 2019-12-12 ENCOUNTER — Other Ambulatory Visit: Payer: Self-pay | Admitting: Internal Medicine

## 2019-12-20 ENCOUNTER — Encounter: Payer: Self-pay | Admitting: Orthopedic Surgery

## 2019-12-20 NOTE — Anesthesia Postprocedure Evaluation (Signed)
Anesthesia Post Note  Patient: Jon Garner.  Procedure(s) Performed: LEFT TOTAL KNEE ARTHROPLASTY (Left Knee)  Anesthesia Type: Spinal Anesthetic complications: no Comments: Pt discharged prior to being seen   No complications documented.   Last Vitals:  Vitals:   12/10/19 0720 12/10/19 1114  BP: (!) 113/53 135/63  Pulse: 93 (!) 102  Resp: 19 18  Temp: 36.9 C 37.2 C  SpO2: 100% 97%    Last Pain:  Vitals:   12/10/19 1114  TempSrc: Oral  PainSc:                  [name],[name]

## 2019-12-28 NOTE — Progress Notes (Signed)
Follow-up Outpatient Visit Date: 12/29/2019  Primary Care Provider: Maple Hudson., MD [address] [place] [address]  Chief Complaint: Follow-up coronary artery disease  HPI:  [name] is a 54 y.o. 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  I last saw him in May, at which time he was feeling well other than bilateral knee problems.  He has since undergone bilateral knee replacements in a staged manner.  Today, Mr. Bilotti reports that he is doing relatively well. He is still recovering from his most recent knee replacement on the left but is noticing some progress. He has not had any chest pain, shortness of breath, palpitations, or lightheadedness. He reports waxing and waning edema in his left leg, which is improving.  --------------------------------------------------------------------------------------------------  Past Medical History:  Diagnosis Date  . Anemia    h/o age 15  . Arthritis   . CAD (coronary artery disease)    a. LHC 4/19: pLAD 95%, mLAD 95%, m/dLAD 90%, ostD2 95%, pLCx 80%, mRCA 70%, dRCA 80%; b. 4-V CABG 05/2017: LIMA-LAD, VG-D1, VG-PDA, VG-OM2  . Essential hypertension 2014  . GERD (gastroesophageal reflux disease) 2015   h/o  . History of kidney stones    h/o  . Morbid obesity (HCC) presently  . Myocardial infarction (HCC) 2019   quadruple bypass  . PXE (pseudoxanthoma elasticum) 1992  . [place] presently  . Stroke [facility]) 1992   a. Age 21   Past Surgical History:  Procedure Laterality Date  . CORONARY ARTERY BYPASS GRAFT N/A 05/29/2017   Procedure: CORONARY ARTERY BYPASS GRAFTING (CABG)  X 4 WITH OPEN HARVESTING OF LEFT AND RIGHT SAPHENOUS  VEINS;  Surgeon: Loreli Slot, MD;  Location: [facility] OR;  Service: Open Heart Surgery;  Laterality: N/A;  . KNEE SURGERY Bilateral   . LEFT HEART CATH AND CORONARY ANGIOGRAPHY N/A 05/26/2017   Procedure: LEFT HEART CATH AND CORONARY ANGIOGRAPHY;  Surgeon: Antonieta Iba, MD;  Location: [facility] INVASIVE CV LAB;  Service: Cardiovascular;  Laterality: N/A;  . TEE WITHOUT CARDIOVERSION N/A 05/29/2017   Procedure: TRANSESOPHAGEAL ECHOCARDIOGRAM (TEE);  Surgeon: Loreli Slot, MD;  Location: [facility] OR;  Service: Open Heart Surgery;  Laterality: N/A;  . TONSILLECTOMY     age 65  . TOTAL KNEE ARTHROPLASTY Right 08/12/2019   Procedure: TOTAL KNEE ARTHROPLASTY;  Surgeon: Juanell Fairly, MD;  Location: [facility] ORS;  Service: Orthopedics;  Laterality: Right;  . TOTAL KNEE ARTHROPLASTY Left 12/07/2019   Procedure: LEFT TOTAL KNEE ARTHROPLASTY;  Surgeon: Juanell Fairly, MD;  Location: [facility] ORS;  Service: Orthopedics;  Laterality: Left;    Current Meds  Medication Sig  . acetaminophen (TYLENOL) 500 MG tablet Take 1,000 mg by mouth every 6 (six) hours as needed for moderate pain or headache.  Marland Kitchen aspirin EC 325 MG EC tablet Take 1 tablet (325 mg total) by mouth 2 (two) times daily.  Marland Kitchen atorvastatin (LIPITOR) 80 MG tablet Take 1 tablet (80 mg total) by mouth daily at 6 PM.  . bisacodyl (DULCOLAX) 5 MG EC tablet Take 2 tablets (10 mg total) by mouth daily as needed for moderate constipation.  . docusate sodium (COLACE) 100 MG capsule Take 1 capsule (100 mg total) by mouth 2 (two) times daily.  . furosemide (  LASIX) 40 MG tablet Take 0.5 tablets (20 mg total) by mouth daily. (Patient taking differently: Take 40 mg by mouth daily. )  . lisinopril (ZESTRIL) 40 MG tablet Take 1 tablet (40 mg total) by mouth daily. (Patient taking differently: Take 20 mg by mouth daily. )  . Menthol, Topical Analgesic, (GOLD BOND EXTRA STRENGTH EX) Apply 1 application topically daily as needed (dry skin).  . metoprolol tartrate  (LOPRESSOR) 25 MG tablet TAKE 1 TABLET(25 MG) BY MOUTH TWICE DAILY  . Multiple Vitamins-Minerals (PRESERVISION AREDS 2) CAPS Take 2 capsules by mouth 2 (two) times daily.  Marland Kitchen OVER THE COUNTER MEDICATION Take 2 capsules by mouth daily. NeuroQ  . OVER THE COUNTER MEDICATION Take 2 tablets by mouth daily. oneshot keto otc supplement  . oxyCODONE (OXY IR/ROXICODONE) 5 MG immediate release tablet Take 1 tablet (5 mg total) by mouth every 4 (four) hours as needed for severe pain (pain score 7-10).  . potassium chloride (KLOR-CON) 10 MEQ tablet Take 1 tablet (10 mEq total) by mouth daily.  . vitamin C (ASCORBIC ACID) 500 MG tablet Take 500 mg by mouth 4 (four) times a week.   . vitamin E 1000 UNIT capsule Take 1,000 Units by mouth 4 (four) times a week.    Allergies: Penicillins  Social History   Tobacco Use  . Smoking status: Former Smoker    Packs/day: 1.50    Years: 10.00    Pack years: 15.00    Types: Cigarettes    Quit date: 12/05/2013    Years since quitting: 6.0  . Smokeless tobacco: Never Used  Vaping Use  . Vaping Use: Never used  Substance Use Topics  . Alcohol use: Not Currently  . Drug use: Never    Family History  Problem Relation Age of Onset  . Dementia Mother   . Other Father        died in his 49's  . COPD Father   . Multiple sclerosis Sister     Review of Systems: A 12-system review of systems was performed and was negative except as noted in the HPI.  --------------------------------------------------------------------------------------------------  Physical Exam: BP 108/60 (BP Location: Left Arm, Patient Position: Sitting, Cuff Size: Large)   Pulse 80   Ht 5\' 8"  (1.727 m)   Wt 284 lb (128.8 kg)   SpO2 99%   BMI 43.18 kg/m   General: NAD. Neck: No JVD or HJR, though body habitus limits evaluation. Lungs: Clear to auscultation bilaterally without wheezes or crackles. Heart: Regular rate and rhythm without murmurs, rubs, or gallops. Abdomen: Soft,  nontender, nondistended. Extremities: Trace pretibial edema, left greater than right.  EKG: Normal sinus rhythm with PACs and inferior infarct. PACs are new since 06/17/2019. Otherwise, there has been no significant interval change.  Lab Results  Component Value Date   WBC 10.6 (H) 12/09/2019   HGB 9.1 (L) 12/09/2019   HCT 28.0 (L) 12/09/2019   MCV 89.7 12/09/2019   PLT 231 12/09/2019    Lab Results  Component Value Date   NA 138 12/08/2019   K 4.9 12/08/2019   CL 107 12/08/2019   CO2 24 12/08/2019   BUN 22 (H) 12/08/2019   CREATININE 1.28 (H) 12/08/2019   GLUCOSE 129 (H) 12/08/2019   ALT 17 02/24/2019    Lab Results  Component Value Date   CHOL 103 02/24/2019   HDL 33 (L) 02/24/2019   LDLCALC 54 02/24/2019   TRIG 81 02/24/2019   CHOLHDL 3.1 02/24/2019    --------------------------------------------------------------------------------------------------  ASSESSMENT AND PLAN: Coronary artery disease: Mr. Civello is doing well without symptoms to suggest worsening coronary insufficiency. We have agreed to continue his current medications for secondary prevention. He can transition back to aspirin 81 mg daily from 325 mg twice daily one directed to do so by Dr. Martha Clan.  Hyperlipidemia: LDL at goal on last check in 02/2019. Continue atorvastatin 80 mg daily.  Hypertension: Blood pressure well controlled. No medication changes at this time.  Morbid obesity: BMI greater than 40. Encourage I encouraged weight loss through diet and exercise, particularly as Mr. Asato mobility improves as he recovers from his knee replacements.  Follow-up: Return to clinic in 6 months.  Yvonne Kendall, MD 12/31/2019 11:09 AM

## 2019-12-29 ENCOUNTER — Ambulatory Visit: Payer: Managed Care, Other (non HMO) | Admitting: Internal Medicine

## 2019-12-29 ENCOUNTER — Encounter: Payer: Self-pay | Admitting: Internal Medicine

## 2019-12-29 ENCOUNTER — Other Ambulatory Visit: Payer: Self-pay

## 2019-12-29 ENCOUNTER — Ambulatory Visit (INDEPENDENT_AMBULATORY_CARE_PROVIDER_SITE_OTHER): Payer: Managed Care, Other (non HMO) | Admitting: Internal Medicine

## 2019-12-29 VITALS — BP 108/60 | HR 80 | Ht 68.0 in | Wt 284.0 lb

## 2019-12-29 DIAGNOSIS — I2510 Atherosclerotic heart disease of native coronary artery without angina pectoris: Secondary | ICD-10-CM | POA: Diagnosis not present

## 2019-12-29 DIAGNOSIS — E785 Hyperlipidemia, unspecified: Secondary | ICD-10-CM | POA: Diagnosis not present

## 2019-12-29 NOTE — Patient Instructions (Signed)
Medication Instructions:  Your physician recommends that you continue on your current medications as directed. Please refer to the Current Medication list given to you today.  *If you need a refill on your cardiac medications before your next appointment, please call your pharmacy*   Lab Work: None ORdered If you have labs (blood work) drawn today and your tests are completely normal, you will receive your results only by: Marland Kitchen MyChart Message (if you have MyChart) OR . A paper copy in the mail If you have any lab test that is abnormal or we need to change your treatment, we will call you to review the results.   Testing/Procedures: None Ordered   Follow-Up: At [facility], you and your health needs are our priority.  As part of our continuing mission to provide you with exceptional heart care, we have created designated Provider Care Teams.  These Care Teams include your primary Cardiologist (physician) and Advanced Practice Providers (APPs -  Physician Assistants and Nurse Practitioners) who all work together to provide you with the care you need, when you need it.  We recommend signing up for the patient portal called "MyChart".  Sign up information is provided on this After Visit Summary.  MyChart is used to connect with patients for Virtual Visits (Telemedicine).  Patients are able to view lab/test results, encounter notes, upcoming appointments, etc.  Non-urgent messages can be sent to your provider as well.   To learn more about what you can do with MyChart, go to [email or web address].    Your next appointment:   6 month(s)  The format for your next appointment:   In Person  Provider:   You may see Dr. Okey Dupre or one of the following Advanced Practice Providers on your designated Care Team:    Nicolasa Ducking, NP  Eula Listen, PA-C  Marisue Ivan, PA-C  Cadence E. Lopez, [place]  Gillian Shields, NP    Other Instructions

## 2019-12-31 ENCOUNTER — Encounter: Payer: Self-pay | Admitting: Internal Medicine

## 2020-01-23 ENCOUNTER — Other Ambulatory Visit: Payer: Self-pay | Admitting: Internal Medicine

## 2020-01-24 ENCOUNTER — Telehealth: Payer: Self-pay | Admitting: Internal Medicine

## 2020-01-24 NOTE — Telephone Encounter (Signed)
Patient states pharmacy did not receive rx.  Please resend

## 2020-01-24 NOTE — Telephone Encounter (Signed)
Called patient to verify which medication he was referring too.  Patient stated it was his atorvastatin but the reason it was denied was because it was to soon to refill.  Patient states he still has about 15 days left and will call pharmacy when he is lower on medication.

## 2020-01-26 ENCOUNTER — Ambulatory Visit (INDEPENDENT_AMBULATORY_CARE_PROVIDER_SITE_OTHER): Payer: Managed Care, Other (non HMO)

## 2020-01-26 ENCOUNTER — Other Ambulatory Visit: Payer: Self-pay

## 2020-01-26 DIAGNOSIS — Z23 Encounter for immunization: Secondary | ICD-10-CM

## 2020-03-11 ENCOUNTER — Other Ambulatory Visit: Payer: Self-pay | Admitting: Internal Medicine

## 2020-06-28 ENCOUNTER — Telehealth: Payer: Self-pay | Admitting: Physician Assistant

## 2020-06-28 ENCOUNTER — Encounter: Payer: Self-pay | Admitting: Physician Assistant

## 2020-06-28 DIAGNOSIS — U071 COVID-19: Secondary | ICD-10-CM

## 2020-06-28 MED ORDER — BENZONATATE 100 MG PO CAPS: 100.0000 mg | ORAL_CAPSULE | Freq: Three times a day (TID) | ORAL | 0 refills | Status: DC | PRN

## 2020-06-28 NOTE — Progress Notes (Signed)
I have spent 5 minutes in review of e-visit questionnaire, review and updating patient chart, medical decision making and response to patient.   Tillmon Cody Johnathyn Viscomi, PA-C    

## 2020-06-28 NOTE — Progress Notes (Signed)
E-Visit for Positive Covid Test Result We are sorry you are not feeling well. We are here to help!  You have tested positive for COVID-19, meaning that you were infected with the novel coronavirus and could give the virus to others.  It is vitally important that you stay home so you do not spread it to others.      Please continue isolation at home, for at least 10 days since the start of your symptoms and until you have had 24 hours with no fever (without taking a fever reducer) and with improving of symptoms.  If you have no symptoms but tested positive (or all symptoms resolve after 5 days and you have no fever) you can leave your house but continue to wear a mask around others for an additional 5 days. If you have a fever,continue to stay home until you have had 24 hours of no fever. Most cases improve 5-10 days from onset but we have seen a small number of patients who have gotten worse after the 10 days.  Please be sure to watch for worsening symptoms and remain taking the proper precautions.   Go to the nearest hospital ED for assessment if fever/cough/breathlessness are severe or illness seems like a threat to life.    The following symptoms may appear 2-14 days after exposure: . Fever . Cough . Shortness of breath or difficulty breathing . Chills . Repeated shaking with chills . Muscle pain . Headache . Sore throat . New loss of taste or smell . Fatigue . Congestion or runny nose . Nausea or vomiting . Diarrhea  You have been enrolled in [facility] Monitoring for COVID-19. Daily you will receive a questionnaire within the MyChart website. Our COVID-19 response team will be monitoring your responses daily. Giving your medical history, I have also sent your information to our COVID treatment team who will reach out to you to discuss additional treatments.   You can use medication such as prescription cough medication called Tessalon Perles 100 mg. You may take 1-2 capsules every  8 hours as needed for cough  You may also take acetaminophen (Tylenol) as needed for fever.  HOME CARE: . Only take medications as instructed by your medical team. . Drink plenty of fluids and get plenty of rest. . A steam or ultrasonic humidifier can help if you have congestion.   GET HELP RIGHT AWAY IF YOU HAVE EMERGENCY WARNING SIGNS.  Call 911 or proceed to your closest emergency facility if: . You develop worsening high fever. . Trouble breathing . Bluish lips or face . Persistent pain or pressure in the chest . New confusion . Inability to wake or stay awake . You cough up blood. . Your symptoms become more severe . Inability to hold down food or fluids  This list is not all possible symptoms. Contact your medical provider for any symptoms that are severe or concerning to you.    Your e-visit answers were reviewed by a board certified advanced clinical practitioner to complete your personal care plan.  Depending on the condition, your plan could have included both over the counter or prescription medications.  If there is a problem please reply once you have received a response from your provider.  Your safety is important to [place].  If you have drug allergies check your prescription carefully.    You can use MyChart to ask questions about today's visit, request a non-urgent call back, or ask for a work or school excuse  for 24 hours related to this e-Visit. If it has been greater than 24 hours you will need to follow up with your provider, or enter a new e-Visit to address those concerns. You will get an e-mail in the next two days asking about your experience.  I hope that your e-visit has been valuable and will speed your recovery. Thank you for using e-visits.

## 2020-06-28 NOTE — Progress Notes (Signed)
Erroneous encounter. Was trying to do a video visit for his brother. Gave them instructions for how to set up a MyChart account to so his brother can submit a video visit for himself.

## 2020-06-29 ENCOUNTER — Telehealth: Payer: Self-pay

## 2020-06-29 ENCOUNTER — Ambulatory Visit: Payer: Managed Care, Other (non HMO) | Admitting: Internal Medicine

## 2020-06-29 NOTE — Telephone Encounter (Signed)
Called to discuss with patient about COVID-19 symptoms and the use of one of the available treatments for those with mild to moderate Covid symptoms and at a high risk of hospitalization.  Pt appears to qualify for outpatient treatment due to co-morbid conditions and/or a member of an at-risk group in accordance with the FDA Emergency Use Authorization.    Symptom onset: 06/28/20 Cough,body aches Vaccinated: Yes Booster? Yes Immunocompromised? No Qualifiers: THN NIH Criteria: Tier 4  Declines further treatment.  Jon Garner

## 2020-08-03 ENCOUNTER — Ambulatory Visit: Payer: Self-pay | Admitting: Internal Medicine

## 2020-08-20 ENCOUNTER — Other Ambulatory Visit: Payer: Self-pay | Admitting: Internal Medicine

## 2020-09-11 ENCOUNTER — Telehealth: Payer: Self-pay | Admitting: Internal Medicine

## 2020-09-11 NOTE — Telephone Encounter (Signed)
Spoke to pt. He noticed fluid filled blisters on his right leg last night.  Pt reports usually has issues with dry skin in this area on both legs, areas of blisters did itch at first.  He drained them last night and they have reappeared today.  Some soreness noted to areas on skin as well.  BLE "with some tightness, but I have not noted much swelling." Tightness below knees per pt.  Denies indention when presses on leg. Skin is dry.  Pt does continue Lasix 20mg  daily, although reports sometimes he takes every other day instead of daily.  Pt did have to go to the wound clinic in the past but was discharged.  Pt states that he "is not too concerned, but just wanted to let Dr. know to be sure."

## 2020-09-11 NOTE — Telephone Encounter (Signed)
Patient states on both lower extremities he has some discoloration and some "water blisters". States his legs are usually dry and he applies lotion, but this morning he noticed the skin being tight and the blisters.  Please call to discuss.

## 2020-09-12 NOTE — Telephone Encounter (Signed)
Spoke to pt. Notified pt scheduling will contact him to schedule first available appointment.  Pt appreciative and voiced understanding. Forwarding to scheduling.

## 2020-09-12 NOTE — Telephone Encounter (Signed)
He is overdue for follow-up, as he has cancelled previously scheduled appointments with [place].  If he would like to have [place] assess this, please schedule him for the next available routine visit with me or an APP.  He may also want to f/u with his APP if more urgent concerns arise.  Yvonne Kendall, MD [facility]

## 2020-09-25 ENCOUNTER — Ambulatory Visit: Payer: Self-pay | Admitting: Medical

## 2020-10-15 NOTE — Progress Notes (Deleted)
Office Visit    Patient Name: Jon Garner. Date of Encounter: 10/15/2020  PCP:  Maple Hudson., MD   [facility]  Cardiologist:  Dr. Okey Dupre Advanced Practice Provider:  No care team member to display Electrophysiologist:  None :[id number]}   Chief Complaint    No chief complaint on file.   55 y.o. 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  Past Medical History    Past Medical History:  Diagnosis Date   Anemia    h/o age 45   Arthritis    CAD (coronary artery disease)    a. LHC 4/19: pLAD 95%, mLAD 95%, m/dLAD 90%, ostD2 95%, pLCx 80%, mRCA 70%, dRCA 80%; b. 4-V CABG 05/2017: LIMA-LAD, VG-D1, VG-PDA, VG-OM2   Essential hypertension 2014   GERD (gastroesophageal reflux disease) 2015   h/o   History of kidney stones    h/o   Morbid obesity (HCC) presently   Myocardial infarction (HCC) 2019   quadruple bypass   PXE (pseudoxanthoma elasticum) 1992   Snores presently   Stroke [facility]) 1992   a. Age 40   Past Surgical History:  Procedure Laterality Date   CORONARY ARTERY BYPASS GRAFT N/A 05/29/2017   Procedure: CORONARY ARTERY BYPASS GRAFTING (CABG)  X 4 WITH OPEN HARVESTING OF LEFT AND RIGHT SAPHENOUS VEINS;  Surgeon: Loreli Slot, MD;  Location: [facility] OR;  Service: Open Heart Surgery;  Laterality: N/A;   KNEE SURGERY Bilateral    LEFT HEART CATH AND CORONARY ANGIOGRAPHY N/A 05/26/2017   Procedure: LEFT HEART CATH AND CORONARY ANGIOGRAPHY;  Surgeon: Antonieta Iba, MD;  Location: [facility] INVASIVE CV LAB;  Service: Cardiovascular;  Laterality: N/A;   TEE WITHOUT CARDIOVERSION N/A 05/29/2017   Procedure: TRANSESOPHAGEAL ECHOCARDIOGRAM (TEE);  Surgeon:  Loreli Slot, MD;  Location: [facility] OR;  Service: Open Heart Surgery;  Laterality: N/A;   TONSILLECTOMY     age 89   TOTAL KNEE ARTHROPLASTY Right 08/12/2019   Procedure: TOTAL KNEE ARTHROPLASTY;  Surgeon: Juanell Fairly, MD;  Location: [facility] ORS;  Service: Orthopedics;  Laterality: Right;   TOTAL KNEE ARTHROPLASTY Left 12/07/2019   Procedure: LEFT TOTAL KNEE ARTHROPLASTY;  Surgeon: Juanell Fairly, MD;  Location: [facility] ORS;  Service: Orthopedics;  Laterality: Left;    Allergies  Allergies  Allergen Reactions   Penicillins Other (See Comments)    Family has had severe reactions to penicillins       History of Present Illness    Jon Bair. is a 56 y.o. male with PMH as above. ***  Home Medications   Current Outpatient Medications  Medication Instructions   acetaminophen (TYLENOL) 1,000 mg, Oral, Every 6 hours PRN   aspirin 325 mg, Oral, 2 times daily   atorvastatin (LIPITOR) 80 MG tablet TAKE 1 TABLET(80 MG) BY MOUTH DAILY AT 6 PM   benzonatate (TESSALON) 100 mg, Oral, 3 times daily PRN   bisacodyl (DULCOLAX) 10 mg, Oral, Daily PRN   docusate sodium (COLACE) 100 mg, Oral, 2 times daily   furosemide (LASIX) 20 mg, Oral, Daily   lisinopril (ZESTRIL) 40 MG tablet TAKE 1 TABLET(40 MG) BY MOUTH DAILY   Menthol, Topical Analgesic, (GOLD BOND EXTRA STRENGTH EX) 1 application, Apply externally, Daily PRN  metoprolol tartrate (LOPRESSOR) 25 MG tablet TAKE 1 TABLET(25 MG) BY MOUTH TWICE DAILY   Multiple Vitamins-Minerals (PRESERVISION AREDS 2) CAPS 2 capsules, Oral, 2 times daily   OVER THE COUNTER MEDICATION 2 capsules, Oral, Daily, NeuroQ   OVER THE COUNTER MEDICATION 2 tablets, Oral, Daily, oneshot keto otc supplement   oxyCODONE (OXY IR/ROXICODONE) 5 mg, Oral, Every 4 hours PRN   potassium chloride (KLOR-CON) 10 MEQ tablet TAKE 1 TABLET(10 MEQ) BY MOUTH DAILY   vitamin C (ASCORBIC ACID) 500 mg, Oral, 4 times weekly   vitamin E 1,000 Units, Oral, 4 times weekly      Review of Systems    ***.   All other systems reviewed and are otherwise negative except as noted above.  Physical Exam    VS:  There were no vitals taken for this visit. , BMI There is no height or weight on file to calculate BMI. GEN: Well nourished, well developed, in no acute distress. HEENT: normal. Neck: Supple, no JVD, carotid bruits, or masses. Cardiac: RRR, no murmurs, rubs, or gallops. No clubbing, cyanosis, edema.  Radials/DP/PT 2+ and equal bilaterally.  Respiratory:  Respirations regular and unlabored, clear to auscultation bilaterally. GI: Soft, nontender, nondistended, BS + x 4. MS: no deformity or atrophy. Skin: warm and dry, no rash. Neuro:  Strength and sensation are intact. Psych: Normal affect.  Accessory Clinical Findings    ECG personally reviewed by me today - *** - no acute changes.  VITALS Reviewed today   Temp Readings from Last 3 Encounters:  12/10/19 99 F (37.2 C) (Oral)  08/14/19 99.3 F (37.4 C) (Oral)  05/06/19 (!) 97.3 F (36.3 C) (Temporal)   BP Readings from Last 3 Encounters:  12/29/19 108/60  12/10/19 135/63  08/14/19 (!) 163/74   Pulse Readings from Last 3 Encounters:  12/29/19 80  12/10/19 (!) 102  08/14/19 89    Wt Readings from Last 3 Encounters:  12/29/19 284 lb (128.8 kg)  12/07/19 291 lb 0.1 oz (132 kg)  06/17/19 299 lb 6 oz (135.8 kg)     LABS  reviewed today   Lab Results  Component Value Date   WBC 10.6 (H) 12/09/2019   HGB 9.1 (L) 12/09/2019   HCT 28.0 (L) 12/09/2019   MCV 89.7 12/09/2019   PLT 231 12/09/2019   Lab Results  Component Value Date   CREATININE 1.28 (H) 12/08/2019   BUN 22 (H) 12/08/2019   NA 138 12/08/2019   K 4.9 12/08/2019   CL 107 12/08/2019   CO2 24 12/08/2019   Lab Results  Component Value Date   ALT 17 02/24/2019   AST 18 02/24/2019   ALKPHOS 91 02/24/2019   BILITOT 0.5 02/24/2019   Lab Results  Component Value Date   CHOL 103 02/24/2019   HDL 33 (L) 02/24/2019    LDLCALC 54 02/24/2019   TRIG 81 02/24/2019   CHOLHDL 3.1 02/24/2019    Lab Results  Component Value Date   HGBA1C 5.8 (H) 08/04/2019   Lab Results  Component Value Date   TSH 3.225 06/01/2017     STUDIES/PROCEDURES reviewed today   ***  Assessment & Plan    ***  Medication changes: *** Labs ordered: *** Studies / Imaging ordered: *** Future considerations: *** Disposition: ***  *Please be aware that the above documentation was completed voice recognition software and may contain dictation errors.    Total time spent with patient today *** minutes. This includes reviewing records, evaluating the patient, and  coordinating care. Face-to-face time >50%.    Lennon Alstrom, PA-C 10/15/2020

## 2020-10-16 ENCOUNTER — Ambulatory Visit: Payer: Self-pay | Admitting: Physician Assistant

## 2020-10-17 ENCOUNTER — Encounter: Payer: Self-pay | Admitting: Physician Assistant

## 2021-10-13 IMAGING — CR DG CHEST 2V
1 series · 2 of 2 positions shown · non-contrast
Comparison: Prior chest radiographs 07/01/2017 and earlier

CLINICAL DATA: Preoperative chest exam. Additional history
provided: Preoperative chest x-ray for knee replacement. History of
CAD, hypertension, stroke. Former smoker.

EXAM:
CHEST - 2 VIEW

[Series 1: dg chest 2 view · 0.14mm/px · 2 of 2 slices shown]
[im 1/2]
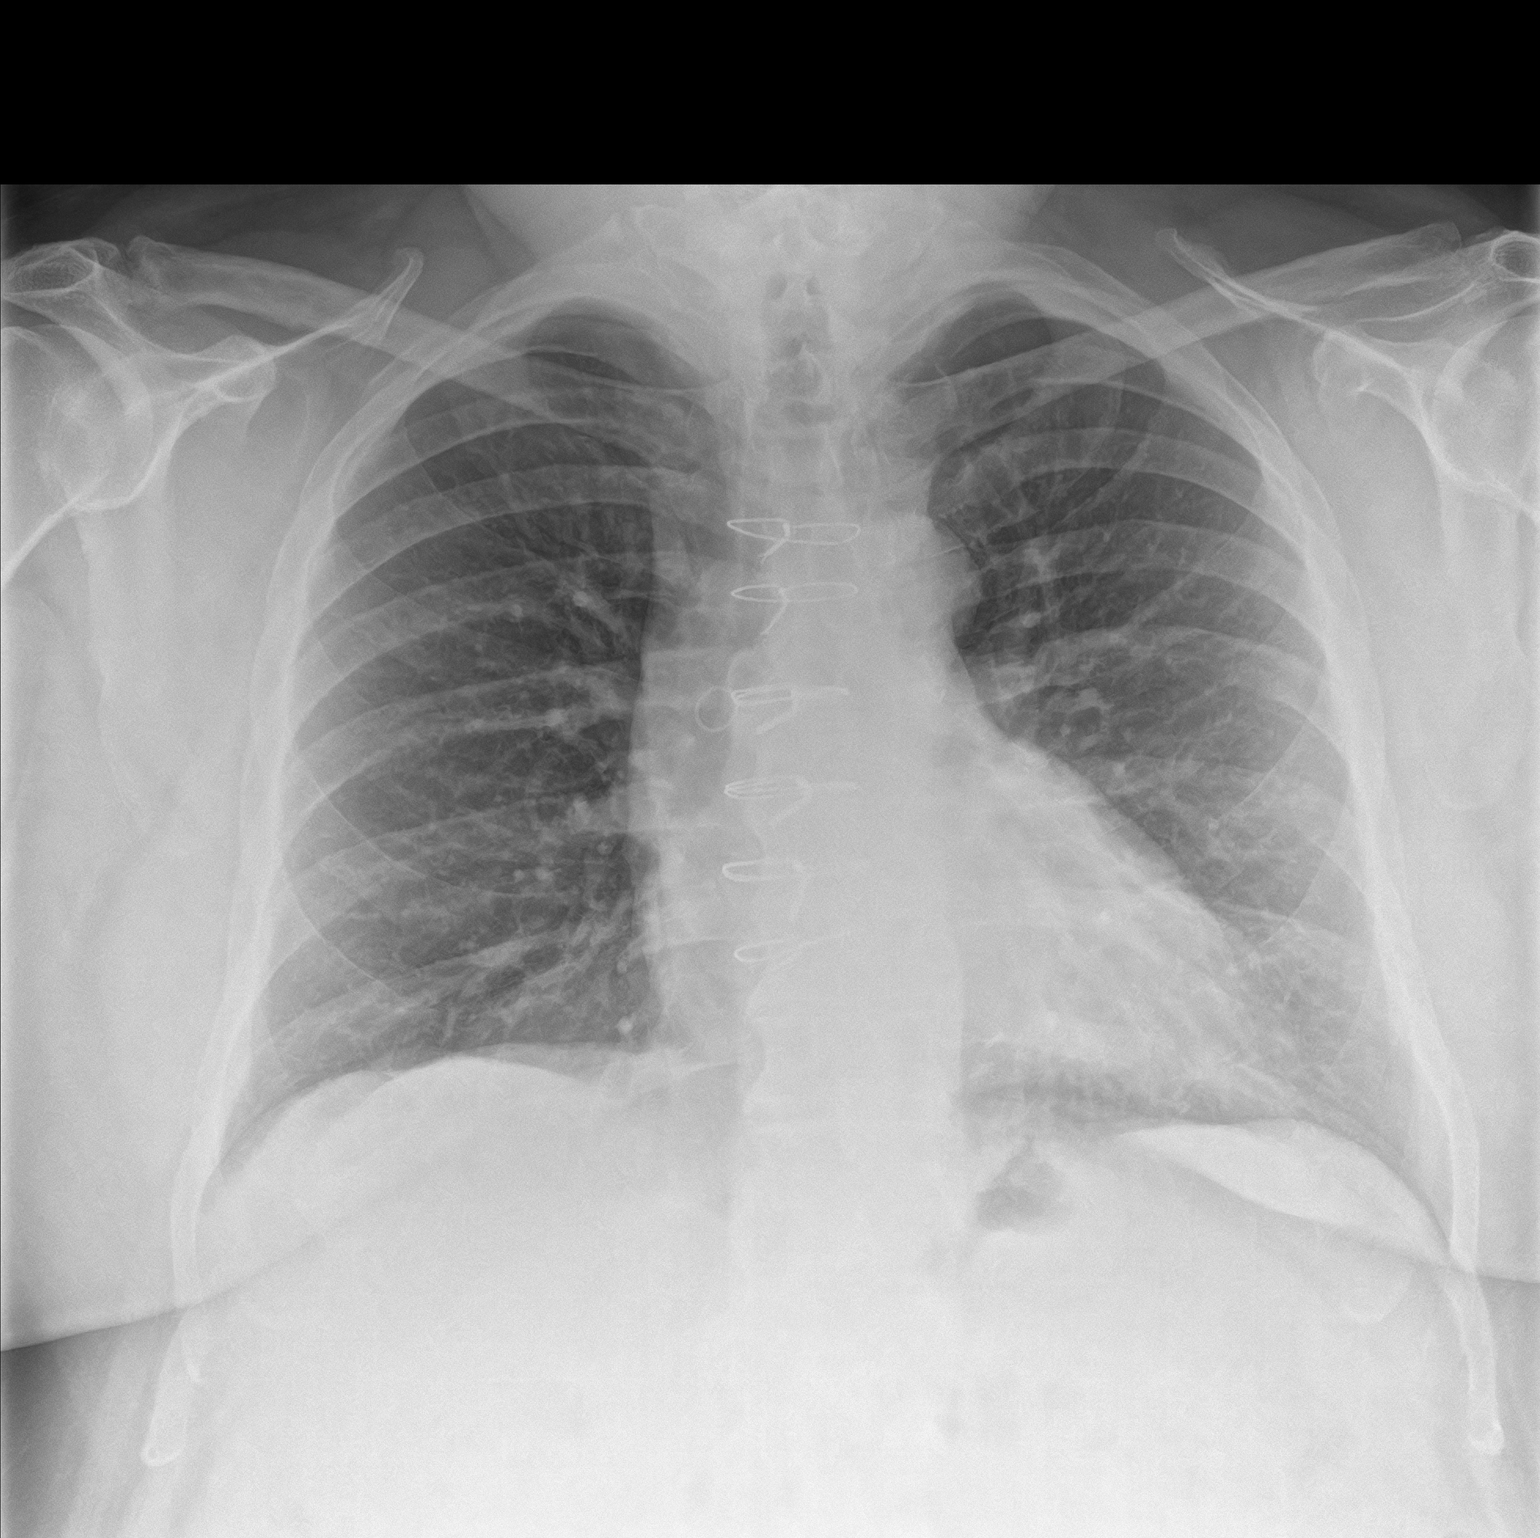
[im 2/2]
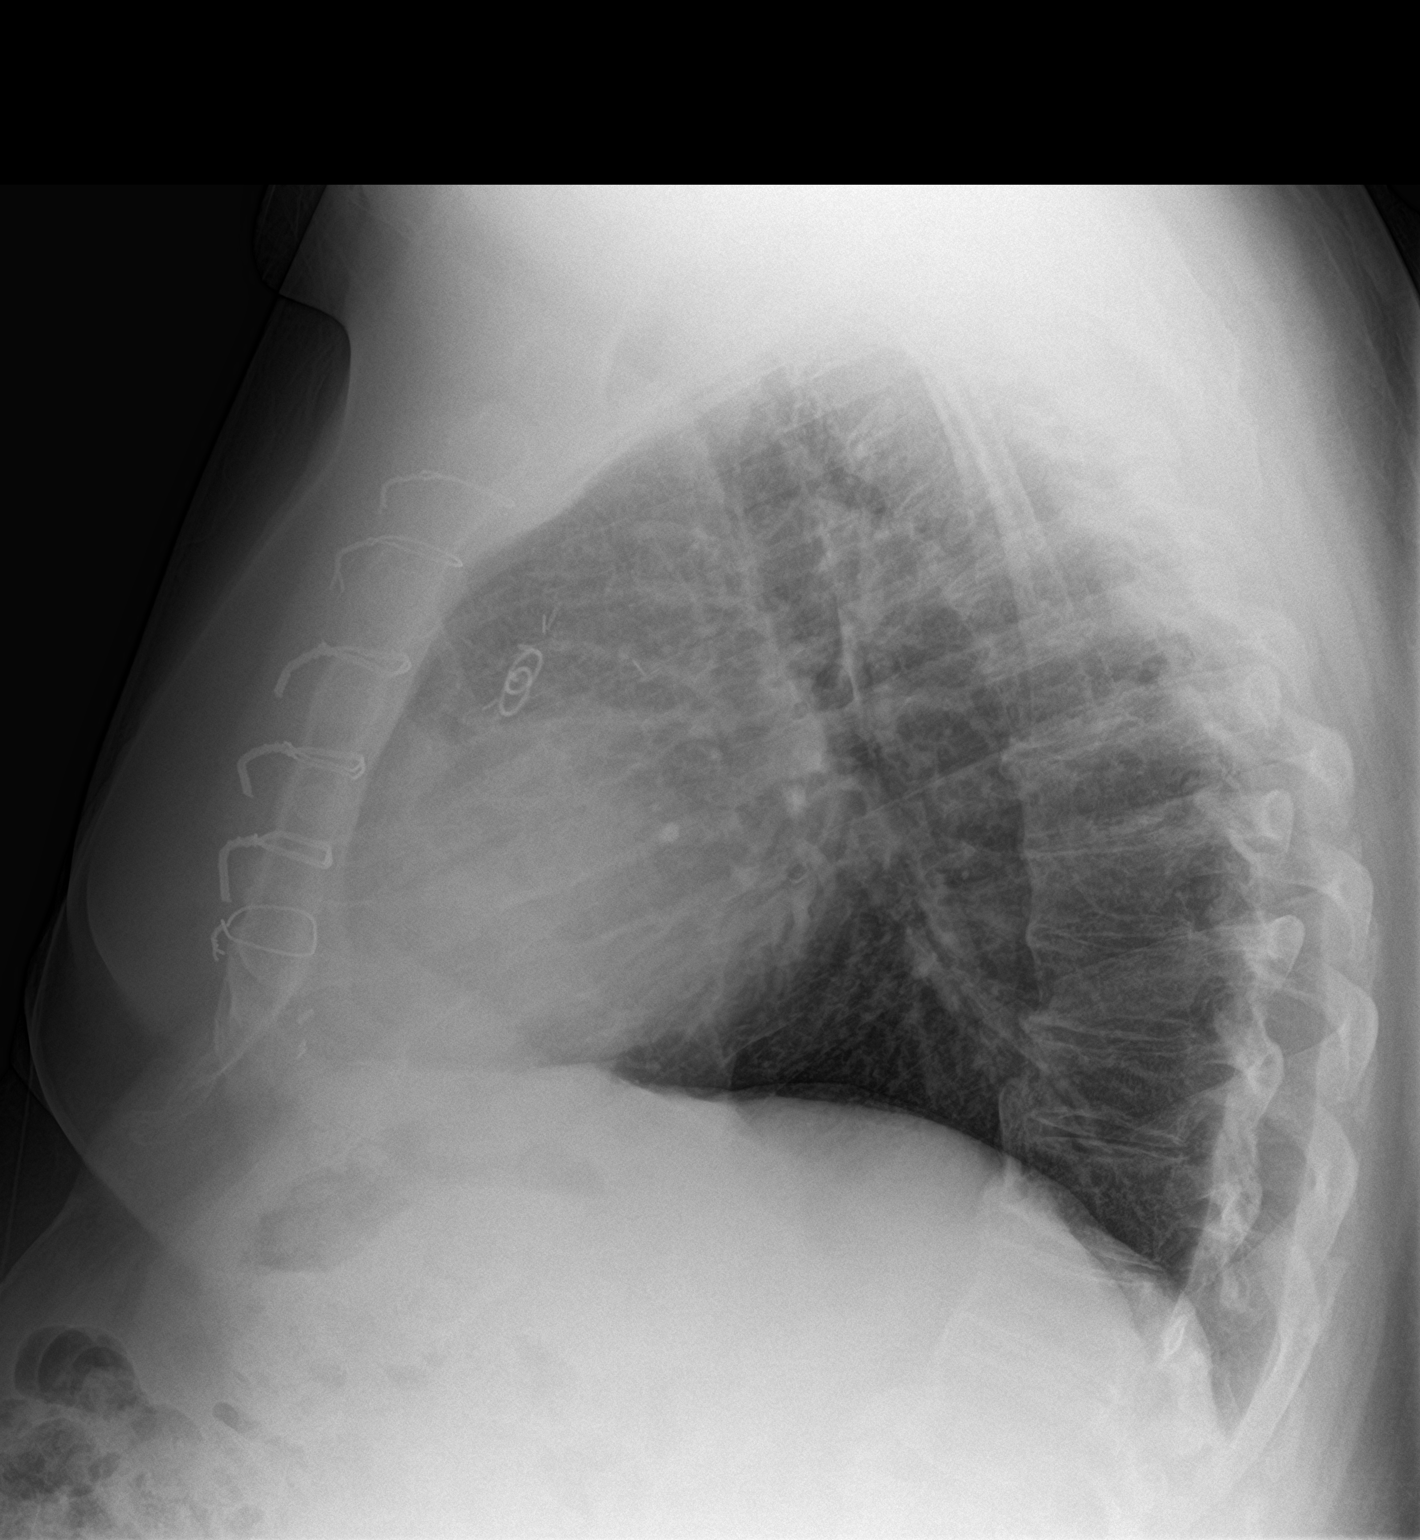

[2 of 2 positions shown; findings below may reference images not displayed]

FINDINGS: Prior median sternotomy and CABG. Heart size at the upper limits of
normal, unchanged. There is no appreciable airspace consolidation.
No frank pulmonary edema. No evidence of pleural effusion or
pneumothorax. No acute bony abnormality is identified.
IMPRESSION: No evidence of acute cardiopulmonary abnormality.

Prior median sternotomy and CABG.

Heart size at the upper limits of normal, unchanged.

## 2021-10-21 IMAGING — DX DG KNEE 1-2V PORT*R*
2 series · 2 of 2 positions shown · non-contrast
Comparison: None.

CLINICAL DATA: Status post total knee replacement.

EXAM:
PORTABLE RIGHT KNEE - 1-2 VIEW

[knee ap]
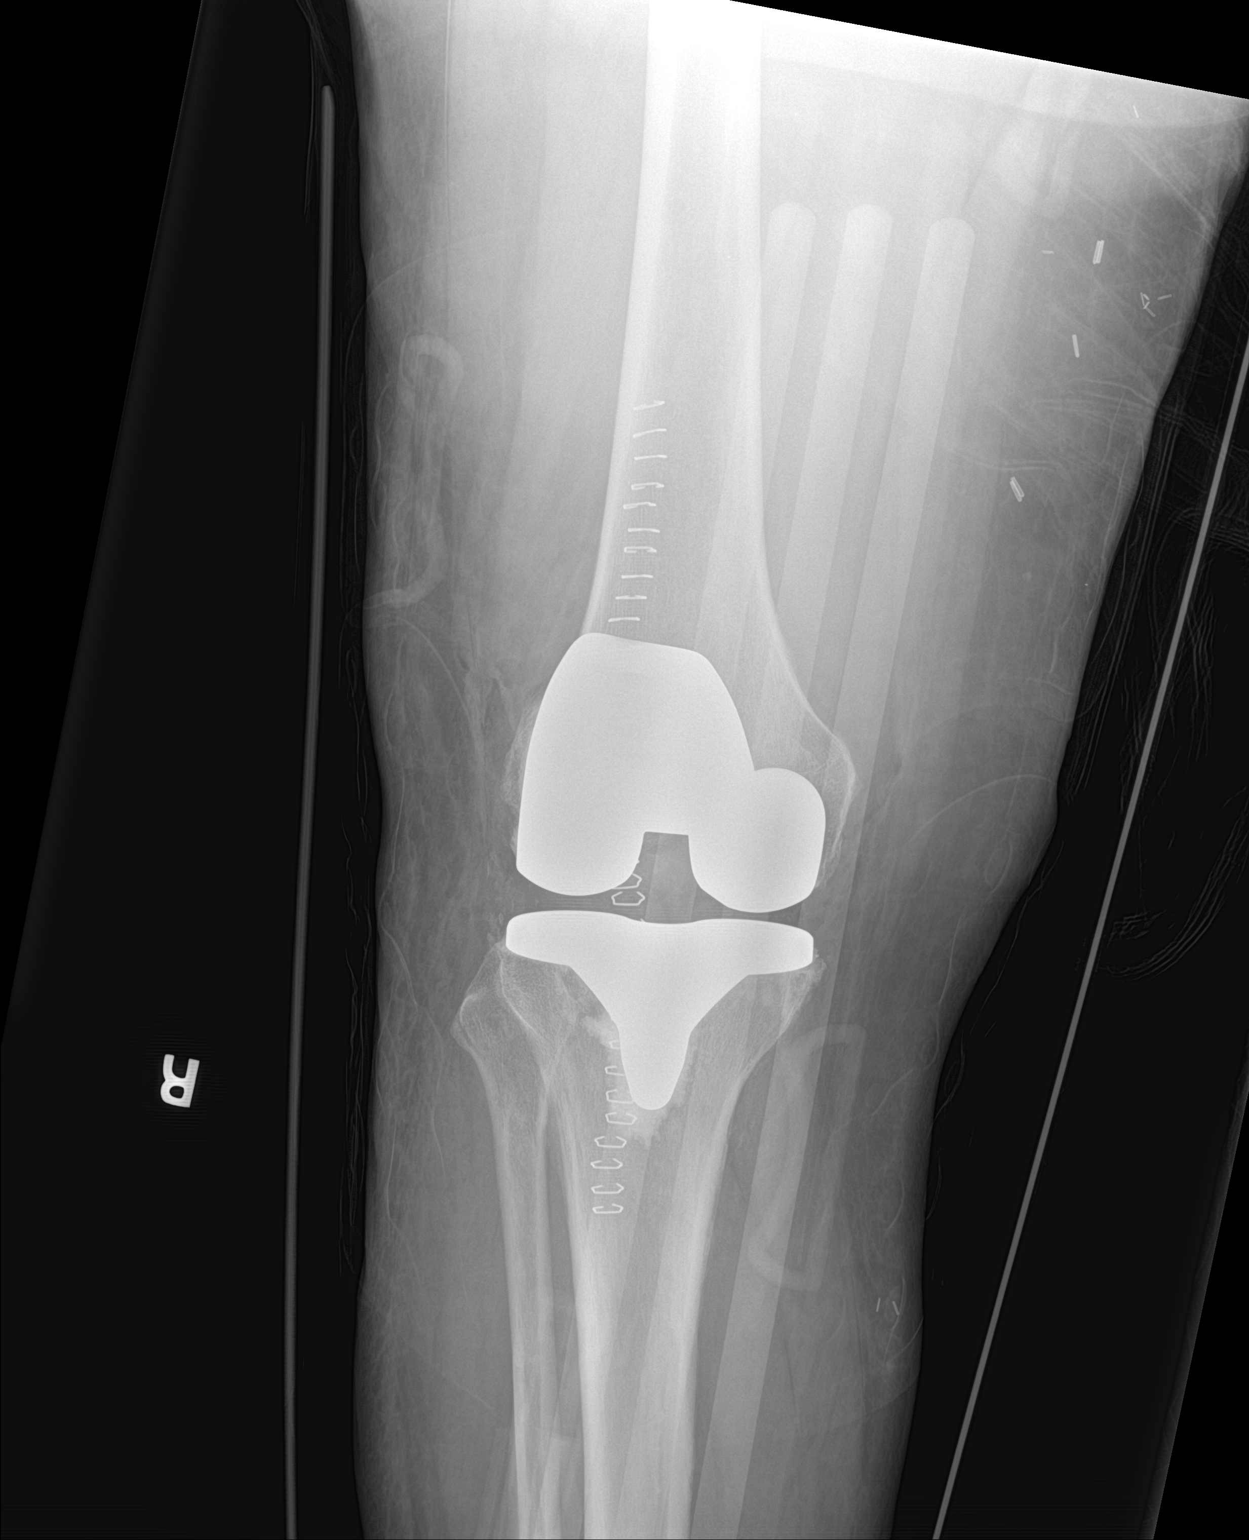

[knee lat]
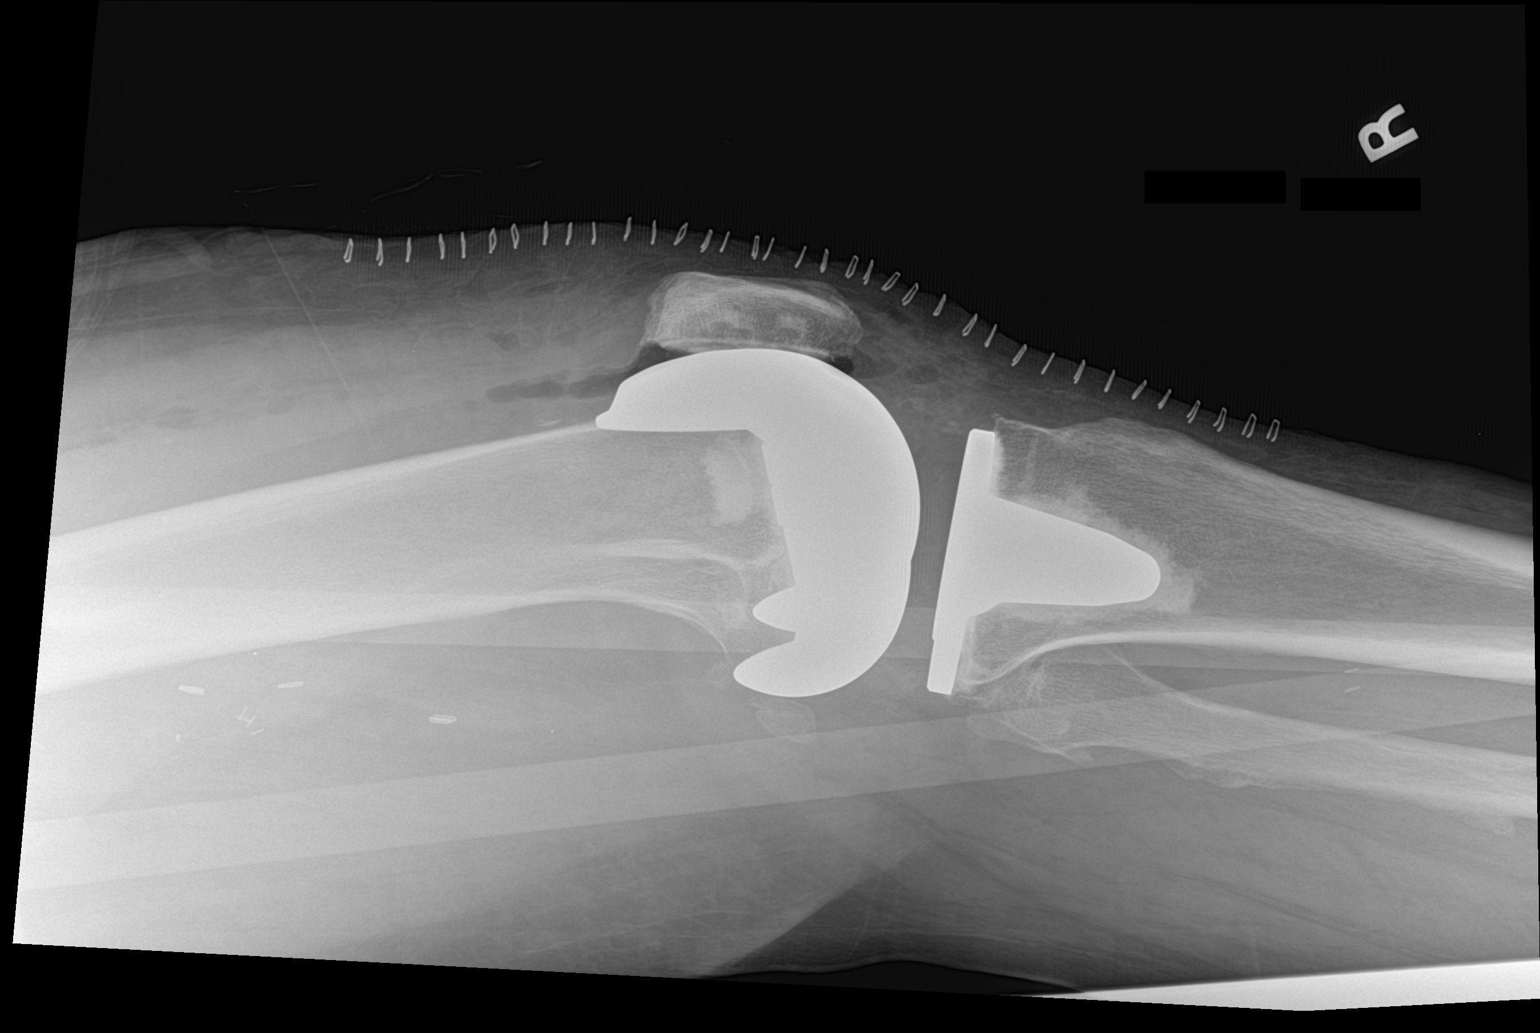

[2 of 2 positions shown; findings below may reference images not displayed]

FINDINGS: Right knee arthroplasty in expected alignment. There is been
patellar resurfacing. No periprosthetic lucency or fracture. Recent
postsurgical change includes air and edema in the soft tissues and
joint space with anterior skin staples.
IMPRESSION: Right knee arthroplasty without immediate postoperative
complication.

## 2022-01-14 DIAGNOSIS — H353221 Exudative age-related macular degeneration, left eye, with active choroidal neovascularization: Secondary | ICD-10-CM | POA: Diagnosis not present

## 2022-02-12 ENCOUNTER — Ambulatory Visit: Payer: Medicaid Other | Admitting: Physician Assistant

## 2022-02-12 ENCOUNTER — Encounter: Payer: Self-pay | Admitting: Physician Assistant

## 2022-02-12 ENCOUNTER — Ambulatory Visit: Payer: Self-pay

## 2022-02-12 VITALS — BP 187/79 | HR 96 | Temp 99.4°F | Resp 20 | Ht 67.5 in | Wt 328.9 lb

## 2022-02-12 DIAGNOSIS — J069 Acute upper respiratory infection, unspecified: Secondary | ICD-10-CM

## 2022-02-12 DIAGNOSIS — R0989 Other specified symptoms and signs involving the circulatory and respiratory systems: Secondary | ICD-10-CM | POA: Diagnosis not present

## 2022-02-12 DIAGNOSIS — I10 Essential (primary) hypertension: Secondary | ICD-10-CM

## 2022-02-12 NOTE — Progress Notes (Unsigned)
Established patient visit   Patient: Jon Garner.   DOB: 1965-07-12   57 y.o. Male  MRN: [id number] Visit Date: 02/12/2022  Today's healthcare provider: Debera Lat, PA-C   Chief Complaint  Patient presents with  . URI   Subjective    HPI Upper respiratory symptoms He complains of congestion, low grade fever, nasal congestion, post nasal drip, shortness of breath, sinus pressure, and sneezing.with fever to 99 degrees Fahrenheit, chills, night sweats, extensive sweating. Onset of symptoms was about a week ago and staying constant.He is very dehydrated.  Past history is significant for no history of pneumonia or bronchitis. Patient is smoker  (1/3 ppd x 10 yrs)  ---------------------------------------------------------------------------------------------------   Medications: Outpatient Medications Prior to Visit  Medication Sig  . acetaminophen (TYLENOL) 500 MG tablet Take 1,000 mg by mouth every 6 (six) hours as needed for moderate pain or headache.  Marland Kitchen aspirin EC 325 MG EC tablet Take 1 tablet (325 mg total) by mouth 2 (two) times daily.  Marland Kitchen atorvastatin (LIPITOR) 80 MG tablet TAKE 1 TABLET(80 MG) BY MOUTH DAILY AT 6 PM  . benzonatate (TESSALON) 100 MG capsule Take 1 capsule (100 mg total) by mouth 3 (three) times daily as needed for cough.  . furosemide (LASIX) 40 MG tablet Take 0.5 tablets (20 mg total) by mouth daily.  . metoprolol tartrate (LOPRESSOR) 25 MG tablet TAKE 1 TABLET(25 MG) BY MOUTH TWICE DAILY  . Multiple Vitamins-Minerals (PRESERVISION AREDS 2) CAPS Take 2 capsules by mouth 2 (two) times daily.  Marland Kitchen OVER THE COUNTER MEDICATION Take 2 capsules by mouth daily. NeuroQ  . OVER THE COUNTER MEDICATION Take 2 tablets by mouth daily. oneshot keto otc supplement  . potassium chloride (KLOR-CON) 10 MEQ tablet TAKE 1 TABLET(10 MEQ) BY MOUTH DAILY  . vitamin C (ASCORBIC ACID) 500 MG tablet Take 500 mg by mouth 4 (four) times a week.   . vitamin E 1000 UNIT capsule  Take 1,000 Units by mouth 4 (four) times a week.  Marland Kitchen lisinopril (ZESTRIL) 40 MG tablet TAKE 1 TABLET(40 MG) BY MOUTH DAILY (Patient not taking: Reported on 02/12/2022)  . oxyCODONE (OXY IR/ROXICODONE) 5 MG immediate release tablet Take 1 tablet (5 mg total) by mouth every 4 (four) hours as needed for severe pain (pain score 7-10). (Patient not taking: Reported on 02/12/2022)  . [DISCONTINUED] bisacodyl (DULCOLAX) 5 MG EC tablet Take 2 tablets (10 mg total) by mouth daily as needed for moderate constipation. (Patient not taking: Reported on 02/12/2022)  . [DISCONTINUED] docusate sodium (COLACE) 100 MG capsule Take 1 capsule (100 mg total) by mouth 2 (two) times daily. (Patient not taking: Reported on 02/12/2022)  . [DISCONTINUED] Menthol, Topical Analgesic, (GOLD BOND EXTRA STRENGTH EX) Apply 1 application topically daily as needed (dry skin). (Patient not taking: Reported on 02/12/2022)   No facility-administered medications prior to visit.    Review of Systems  All other systems reviewed and are negative. Except see HPI  {Labs  Heme  Chem  Endocrine  Serology  Results Review (optional):23779}   Objective    BP (!) 187/79 (BP Location: Right Arm, Patient Position: Sitting, Cuff Size: Large)   Pulse 96   Temp 99.4 F (37.4 C) (Oral)   Resp (!) 97   Ht 5' 7.5" (1.715 m)   Wt (!) 328 lb 14.4 oz (149.2 kg)   BMI 50.75 kg/m  {Show previous vital signs (optional):23777}  Physical Exam Vitals reviewed.  Constitutional:  General: He is in acute distress.     Appearance: Normal appearance. He is not ill-appearing, toxic-appearing or diaphoretic.  HENT:     Head: Normocephalic and atraumatic.     Right Ear: There is impacted cerumen.     Left Ear: Tympanic membrane, ear canal and external ear normal.     Nose: Congestion and rhinorrhea present.     Mouth/Throat:     Pharynx: Posterior oropharyngeal erythema present.     Comments: Postnasal drainage Eyes:     General: No scleral icterus.        Right eye: No discharge.        Left eye: No discharge.     Extraocular Movements: Extraocular movements intact.     Conjunctiva/sclera: Conjunctivae normal.     Pupils: Pupils are equal, round, and reactive to light.  Cardiovascular:     Rate and Rhythm: Normal rate and regular rhythm.     Pulses: Normal pulses.     Heart sounds: Normal heart sounds. No murmur heard. Pulmonary:     Effort: Pulmonary effort is normal. No respiratory distress.     Breath sounds: Normal breath sounds. No wheezing or rhonchi.  Abdominal:     General: Abdomen is flat. Bowel sounds are normal.     Palpations: Abdomen is soft.  Musculoskeletal:        General: Normal range of motion.     Cervical back: Normal range of motion and neck supple.     Right lower leg: No edema.     Left lower leg: No edema.  Lymphadenopathy:     Cervical: No cervical adenopathy.  Skin:    General: Skin is warm and dry.     Findings: No rash.  Neurological:     General: No focal deficit present.     Mental Status: He is alert and oriented to person, place, and time. Mental status is at baseline.  Psychiatric:        Behavior: Behavior normal.        Thought Content: Thought content normal.    ***  No results found for any visits on 02/12/22.  Assessment & Plan     1. Upper respiratory infection, acute *** Could be due to rhino sinusitis X 7 days In the setting of chronic cough x 3 mo Symptomatic treatment was advised with OTC cough medication, antihistamines, ibuprofen / tylenol,  If symptoms persist after day 10, pt needs to RTC  2. Elevated BP If BP consinue t

## 2022-02-12 NOTE — Telephone Encounter (Signed)
    Chief Complaint: Cough, congestion, wheezing, runny nose Symptoms: Above Frequency: 1 week ago Pertinent Negatives: Patient denies  Disposition: [] ED /[] Urgent Care (no appt availability in office) / [x] Appointment(In office/virtual)/ []  [facility]/ [] Home Care/ [] Refused Recommended Disposition /[] [facility]/ []  Follow-up with PCP Additional Notes:   Reason for Disposition  [1] MILD difficulty breathing (e.g., minimal/no SOB at rest, SOB with walking, pulse <100) AND [2] still present when not coughing  Answer Assessment - Initial Assessment Questions 1. ONSET: "When did the cough begin?"      1 week ago 2. SEVERITY: "How bad is the cough today?"      Severe 3. SPUTUM: "Describe the color of your sputum" (none, dry cough; clear, white, yellow, green)     Clear 4. HEMOPTYSIS: "Are you coughing up any blood?" If so ask: "How much?" (flecks, streaks, tablespoons, etc.)     No 5. DIFFICULTY BREATHING: "Are you having difficulty breathing?" If Yes, ask: "How bad is it?" (e.g., mild, moderate, severe)    - MILD: No SOB at rest, mild SOB with walking, speaks normally in sentences, can lie down, no retractions, pulse < 100.    - MODERATE: SOB at rest, SOB with minimal exertion and prefers to sit, cannot lie down flat, speaks in phrases, mild retractions, audible wheezing, pulse 100-120.    - SEVERE: Very SOB at rest, speaks in single words, struggling to breathe, sitting hunched forward, retractions, pulse > 120      Mild 6. FEVER: "Do you have a fever?" If Yes, ask: "What is your temperature, how was it measured, and when did it start?"     No 7. CARDIAC HISTORY: "Do you have any history of heart disease?" (e.g., heart attack, congestive heart failure)      No 8. LUNG HISTORY: "Do you have any history of lung disease?"  (e.g., pulmonary embolus, asthma, emphysema)     No 9. PE RISK FACTORS: "Do you have a history of blood clots?" (or: recent major surgery,  recent prolonged travel, bedridden)     No 10. OTHER SYMPTOMS: "Do you have any other symptoms?" (e.g., runny nose, wheezing, chest pain)       Wheezing, runny nose 11. PREGNANCY: "Is there any chance you are pregnant?" "When was your last menstrual period?"       N/A 12. TRAVEL: "Have you traveled out of the country in the last month?" (e.g., travel history, exposures)       No  Protocols used: Cough - Acute Productive-A-AH

## 2022-02-13 NOTE — Progress Notes (Unsigned)
Follow-up Outpatient Visit Date: 02/14/2022  Primary Care Provider: Mardene Speak, PA-C [address] [place] [address]  Chief Complaint: Cough and sinus congestion  HPI:  Jon Garner is a 57 y.o. 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  I last saw him on 12/2019, at which time he was doing fairly well, though he was still recovering from left knee replacement.  We did not make any medication changes or pursue additional testing.  He has been lost to follow-up since then.  Today, he reports that he had been unable to follow-up with [place] due to lack of insurance.  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  Jon Garner main complaint today is of cold-like symptoms for the last 4 months.  He has been tested for COVID-19 and influenza, both being negative.  He reports continued cough with intermittent clear sputum production as well as sinus congestion.  He has been using over-the-counter cold medication as well as Flonase with only modest relief.  He attributes his elevated blood pressure and diaphoresis today to cold medication.  He has had some exertional dyspnea with his respiratory tract symptoms but previously had been breathing well.  He denies chest pain.  He notes mild swelling in his legs from time to time and remains on furosemide.  He has only been taking metoprolol tartrate once a day because of confusion about the prescribing instructions.  He denies palpitations and lightheadedness.  Home blood pressures are typically 115-25/60-75.  He notes that he has put on about 15 pounds  over the last couple of months, which she attributes primarily to dietary indiscretion.  He has been sleeping upright in a recliner the last couple of nights due to his cough, that seems to worsen when he is recumbent.  --------------------------------------------------------------------------------------------------  Past Medical History:  Diagnosis Date   Anemia    h/o age 49   Arthritis    CAD (coronary artery disease)    a. LHC 4/19: pLAD 95%, mLAD 95%, m/dLAD 90%, ostD2 95%, pLCx 80%, mRCA 70%, dRCA 80%; b. 4-V CABG 05/2017: LIMA-LAD, VG-D1, VG-PDA, VG-OM2   Essential hypertension 2014   GERD (gastroesophageal reflux disease) 2015   h/o   History of kidney stones    h/o   Morbid obesity ([place]) presently   Myocardial infarction ([place]) 2019   quadruple bypass   PXE (pseudoxanthoma elasticum) 1992   [place] presently   Stroke [facility]) 1992   a. Age 49   Past Surgical History:  Procedure Laterality Date   CORONARY ARTERY BYPASS GRAFT N/A 05/29/2017   Procedure: CORONARY ARTERY BYPASS GRAFTING (CABG)  X 4 WITH OPEN HARVESTING OF LEFT AND RIGHT SAPHENOUS VEINS;  Surgeon: Melrose Nakayama, MD;  Location: [facility];  Service: Open Heart Surgery;  Laterality: N/A;   KNEE SURGERY Bilateral    LEFT HEART CATH AND CORONARY ANGIOGRAPHY N/A 05/26/2017   Procedure: LEFT HEART CATH AND CORONARY ANGIOGRAPHY;  Surgeon: Minna Merritts, MD;  Location: [facility];  Service: Cardiovascular;  Laterality: N/A;   TEE WITHOUT CARDIOVERSION N/A 05/29/2017   Procedure: TRANSESOPHAGEAL ECHOCARDIOGRAM (TEE);  Surgeon: Melrose Nakayama, MD;  Location[facility]  OR;  Service: Open Heart Surgery;  Laterality: N/A;   TONSILLECTOMY     age 5   TOTAL KNEE ARTHROPLASTY Right 08/12/2019   Procedure: TOTAL KNEE ARTHROPLASTY;  Surgeon: Juanell Fairly, MD;  Location: [facility] ORS;  Service: Orthopedics;  Laterality: Right;   TOTAL KNEE ARTHROPLASTY Left 12/07/2019   Procedure: LEFT TOTAL KNEE ARTHROPLASTY;  Surgeon:  Juanell Fairly, MD;  Location: [facility] ORS;  Service: Orthopedics;  Laterality: Left;    Current Meds  Medication Sig   acetaminophen (TYLENOL) 500 MG tablet Take 1,000 mg by mouth every 6 (six) hours as needed for moderate pain or headache.   aspirin EC 81 MG tablet Take 81 mg by mouth daily. Swallow whole.   atorvastatin (LIPITOR) 80 MG tablet TAKE 1 TABLET(80 MG) BY MOUTH DAILY AT 6 PM   benzonatate (TESSALON) 100 MG capsule Take 1 capsule (100 mg total) by mouth 3 (three) times daily as needed for cough.   furosemide (LASIX) 40 MG tablet Take 0.5 tablets (20 mg total) by mouth daily.   metoprolol tartrate (LOPRESSOR) 25 MG tablet TAKE 1 TABLET(25 MG) BY MOUTH TWICE DAILY   Multiple Vitamins-Minerals (PRESERVISION AREDS 2) CAPS Take 2 capsules by mouth 2 (two) times daily.   OVER THE COUNTER MEDICATION Take 2 capsules by mouth daily. NeuroQ   OVER THE COUNTER MEDICATION Take 2 tablets by mouth daily. oneshot keto otc supplement   oxyCODONE (OXY IR/ROXICODONE) 5 MG immediate release tablet Take 1 tablet (5 mg total) by mouth every 4 (four) hours as needed for severe pain (pain score 7-10).   potassium chloride (KLOR-CON) 10 MEQ tablet TAKE 1 TABLET(10 MEQ) BY MOUTH DAILY   vitamin C (ASCORBIC ACID) 500 MG tablet Take 500 mg by mouth 4 (four) times a week.    vitamin E 1000 UNIT capsule Take 1,000 Units by mouth 4 (four) times a week.    Allergies: Penicillins  Social History   Tobacco Use   Smoking status: Former    Packs/day: 1.50    Years: 10.00    Total pack years: 15.00    Types: Cigarettes    Quit date: 12/05/2013    Years since quitting: 8.2   Smokeless tobacco: Never  Vaping Use   Vaping Use: Never used  Substance Use Topics   Alcohol use: Not Currently   Drug use: Never    Family History  Problem Relation Age of Onset   Dementia Mother    Other Father        died in his 37's   COPD Father    Multiple sclerosis Sister    COPD Sister    Cancer Sister      Review of Systems: A 12-system review of systems was performed and was negative except as noted in the HPI.  --------------------------------------------------------------------------------------------------  Physical Exam: BP (!) 158/78 (BP Location: Left Arm, Patient Position: Sitting, Cuff Size: Large)   Pulse 86   Ht 5' 7.5" (1.715 m)   Wt (!) 327 lb (148.3 kg)   SpO2 97%   BMI 50.46 kg/m   General:  NAD.  Diaphoresis noted. Neck: Unable to assess JVP due to body habitus. Lungs: Clear to auscultation bilaterally without wheezes or crackles. Heart: Distant heart sounds.  Regular rate and rhythm without murmurs, rubs, or gallops. Abdomen: Obese but soft and nontender. Extremities: 1+ chronic-appearing pretibial edema bilaterally.  EKG: Normal sinus rhythm with left axis deviation, inferior infarct, and poor R wave progression.  Compared to prior tracing from 12/29/2019, poor  R wave progression is more pronounced today.  Lab Results  Component Value Date   WBC 10.6 (H) 12/09/2019   HGB 9.1 (L) 12/09/2019   HCT 28.0 (L) 12/09/2019   MCV 89.7 12/09/2019   PLT 231 12/09/2019    Lab Results  Component Value Date   NA 138 12/08/2019   K 4.9 12/08/2019   CL 107 12/08/2019   CO2 24 12/08/2019   BUN 22 (H) 12/08/2019   CREATININE 1.28 (H) 12/08/2019   GLUCOSE 129 (H) 12/08/2019   ALT 17 02/24/2019    Lab Results  Component Value Date   CHOL 103 02/24/2019   HDL 33 (L) 02/24/2019   LDLCALC 54 02/24/2019   TRIG 81 02/24/2019   CHOLHDL 3.1 02/24/2019    --------------------------------------------------------------------------------------------------  ASSESSMENT AND PLAN: Coronary artery disease: No angina reported.  EKG today shows poor R wave progression that is more pronounced compared to 05/22/2019 and old inferior Q waves.  We will begin with an echocardiogram given his cough and dyspnea.  Low threshold for ischemia evaluation if evidence of cardiomyopathy or any  progression of symptoms.  Continue aspirin and atorvastatin for secondary prevention.  Cough and shortness of breath: Presentation most consistent with respiratory tract infection, though edema and cardiac history raise potential for heart failure.  I will check a CBC, CMP, BNP, chest radiograph, and echocardiogram.  If labs and chest radiograph are unrevealing and symptoms do not improve over the next week, Jon Garner should follow-up with his PCP for further evaluation and consideration of pulmonary consultation.  Chronic HFpEF: Jon Garner has 1+ edema in both legs, which appears chronic.  We will check a BMP and echocardiogram to ensure that his cough and dyspnea are not manifestation of decompensated heart failure.  For now, continue furosemide 20 mg daily and potassium chloride 10 mEq daily.  Hypertension: Blood pressure elevated today, though home readings are typically better.  Respiratory tract infection and OTC cold medication may be contributing.  I have advised Jon Garner to take metoprolol to tartrate 25 mg twice daily rather than once daily.  Defer other medication changes today other than trial of Coricidin HBP rather than other cold medication.  Sodium restriction encouraged.  Hyperlipidemia: Check lipid panel and CMP today.  Continue atorvastatin 80 mg daily for target LDL less than 70.  Morbid obesity: BMI > 50.  Weight loss encouraged.  Diaphoresis: Jon Garner attributes this to his cold medication.  I will check a CBC, CMP, and TSH today.  Follow-up: Return to clinic after echo (4-6 weeks).  Nelva Bush, MD 02/14/2022 9:23 AM

## 2022-02-14 ENCOUNTER — Encounter: Payer: Self-pay | Admitting: Internal Medicine

## 2022-02-14 ENCOUNTER — Other Ambulatory Visit
Admission: RE | Admit: 2022-02-14 | Discharge: 2022-02-14 | Disposition: A | Discharge status: Home / Self Care | Payer: Medicaid Other | Source: Ambulatory Visit | Attending: Internal Medicine | Admitting: Internal Medicine

## 2022-02-14 ENCOUNTER — Ambulatory Visit
Admission: RE | Admit: 2022-02-14 | Discharge: 2022-02-14 | Disposition: A | Discharge status: Home / Self Care | Payer: Medicaid Other | Source: Ambulatory Visit | Attending: Internal Medicine | Admitting: Internal Medicine

## 2022-02-14 ENCOUNTER — Ambulatory Visit: Payer: Medicaid Other | Attending: Internal Medicine | Admitting: Internal Medicine

## 2022-02-14 VITALS — BP 142/70 | HR 86 | Ht 67.5 in | Wt 327.0 lb

## 2022-02-14 DIAGNOSIS — R059 Cough, unspecified: Secondary | ICD-10-CM | POA: Insufficient documentation

## 2022-02-14 DIAGNOSIS — R61 Generalized hyperhidrosis: Secondary | ICD-10-CM | POA: Diagnosis not present

## 2022-02-14 DIAGNOSIS — E785 Hyperlipidemia, unspecified: Secondary | ICD-10-CM

## 2022-02-14 DIAGNOSIS — R0602 Shortness of breath: Secondary | ICD-10-CM | POA: Diagnosis not present

## 2022-02-14 DIAGNOSIS — I5032 Chronic diastolic (congestive) heart failure: Secondary | ICD-10-CM | POA: Diagnosis not present

## 2022-02-14 DIAGNOSIS — I10 Essential (primary) hypertension: Secondary | ICD-10-CM

## 2022-02-14 DIAGNOSIS — I2510 Atherosclerotic heart disease of native coronary artery without angina pectoris: Secondary | ICD-10-CM

## 2022-02-14 LAB — COMPREHENSIVE METABOLIC PANEL
ALT: 52 U/L — ABNORMAL HIGH (ref 0–44)
AST: 46 U/L — ABNORMAL HIGH (ref 15–41)
Albumin: 3.9 g/dL (ref 3.5–5.0)
Alkaline Phosphatase: 74 U/L (ref 38–126)
Anion gap: 9 (ref 5–15)
BUN: 17 mg/dL (ref 6–20)
CO2: 23 mmol/L (ref 22–32)
Calcium: 8.2 mg/dL — ABNORMAL LOW (ref 8.9–10.3)
Chloride: 104 mmol/L (ref 98–111)
Creatinine, Ser: 0.96 mg/dL (ref 0.61–1.24)
GFR, Estimated: >60 mL/min (ref >60)
Glucose, Bld: 112 mg/dL — ABNORMAL HIGH (ref 70–99)
Potassium: 3.6 mmol/L (ref 3.5–5.1)
Sodium: 136 mmol/L (ref 135–145)
Total Bilirubin: 1 mg/dL (ref 0.3–1.2)
Total Protein: 7.5 g/dL (ref 6.5–8.1)

## 2022-02-14 LAB — CBC WITH DIFFERENTIAL/PLATELET
Abs Immature Granulocytes: 0.04 10*3/uL (ref 0.00–0.07)
Basophils Absolute: 0 10*3/uL (ref 0.0–0.1)
Basophils Relative: 1 %
Eosinophils Absolute: 0.2 10*3/uL (ref 0.0–0.5)
Eosinophils Relative: 3 %
HCT: 43.7 % (ref 39.0–52.0)
Hemoglobin: 14.5 g/dL (ref 13.0–17.0)
Immature Granulocytes: 1 %
Lymphocytes Relative: 20 %
Lymphs Abs: 1.5 10*3/uL (ref 0.7–4.0)
MCH: 29.4 pg (ref 26.0–34.0)
MCHC: 33.2 g/dL (ref 30.0–36.0)
MCV: 88.5 fL (ref 80.0–100.0)
Monocytes Absolute: 0.7 10*3/uL (ref 0.1–1.0)
Monocytes Relative: 10 %
Neutro Abs: 4.9 10*3/uL (ref 1.7–7.7)
Neutrophils Relative %: 65 %
Platelets: 251 10*3/uL (ref 150–400)
RBC: 4.94 MIL/uL (ref 4.22–5.81)
RDW: 14 % (ref 11.5–15.5)
WBC: 7.3 10*3/uL (ref 4.0–10.5)
nRBC: 0 % (ref 0.0–0.2)

## 2022-02-14 LAB — LIPID PANEL
Cholesterol: 129 mg/dL (ref 0–200)
HDL: 26 mg/dL — ABNORMAL LOW (ref >40)
LDL Cholesterol: 81 mg/dL (ref 0–99)
Total CHOL/HDL Ratio: 5 RATIO
Triglycerides: 112 mg/dL (ref <150)
VLDL: 22 mg/dL (ref 0–40)

## 2022-02-14 LAB — BRAIN NATRIURETIC PEPTIDE: B Natriuretic Peptide: 29.1 pg/mL (ref 0.0–100.0)

## 2022-02-14 LAB — POC COVID19 BINAXNOW

## 2022-02-14 LAB — POCT INFLUENZA A/B

## 2022-02-14 LAB — TSH: TSH: 2.389 u[IU]/mL (ref 0.350–4.500)

## 2022-02-14 NOTE — Patient Instructions (Signed)
Medication Instructions:  Your Physician recommend you continue on your current medication as directed.    Dr. Saunders Revel recommends taking Metoprolol 25 mg by mouth twice a day  *If you need a refill on your cardiac medications before your next appointment, please call your pharmacy*   Lab Work: Your provider would like for you to have following labs drawn: (CBC, CMP, Lipid, TSH, BNP).   Please go to the [facility] entrance and check in at the front desk.  You do not need an appointment.  They are open from 7am-6 pm.   If you have labs (blood work) drawn today and your tests are completely normal, you will receive your results only by: [place] (if you have MyChart) OR A paper copy in the mail  If you have any lab test that is abnormal or we need to change your treatment, we will call you to review the results.   Testing/Procedures: Your physician has requested that you have an echocardiogram. Echocardiography is a painless test that uses sound waves to create images of your heart. It provides your doctor with information about the size and shape of your heart and how well your heart's chambers and valves are working.   You may receive an ultrasound enhancing agent through an IV if needed to better visualize your heart during the echo. This procedure takes approximately one hour.  There are no restrictions for this procedure.  This will take place at [facility] ([place]) [id number], [place]   A chest x-ray takes a picture of the organs and structures inside the chest, including the heart, lungs, and blood vessels. This test can show several things, including, whether the heart is enlarges; whether fluid is building up in the lungs; and whether pacemaker / defibrillator leads are still in place.  [facility]   Follow-Up: At [facility], you and your health needs are our priority.  As part of our continuing mission to provide you with  exceptional heart care, we have created designated Provider Care Teams.  These Care Teams include your primary Cardiologist (physician) and Advanced Practice Providers (APPs -  Physician Assistants and Nurse Practitioners) who all work together to provide you with the care you need, when you need it.  We recommend signing up for the patient portal called "MyChart".  Sign up information is provided on this After Visit Summary.  MyChart is used to connect with patients for Virtual Visits (Telemedicine).  Patients are able to view lab/test results, encounter notes, upcoming appointments, etc.  Non-urgent messages can be sent to your provider as well.   To learn more about what you can do with MyChart, go to [email or web address].    Your next appointment:   4-6 week(s)  Provider:   You may see Dr. Saunders Revel or one of the following Advanced Practice Providers on your designated Care Team:   Murray Hodgkins, NP Christell Faith, PA-C Cadence Kathlen Mody, PA-C Gerrie Nordmann, NP

## 2022-02-15 ENCOUNTER — Encounter: Payer: Self-pay | Admitting: Internal Medicine

## 2022-02-15 ENCOUNTER — Telehealth: Payer: Self-pay | Admitting: Internal Medicine

## 2022-02-15 MED ORDER — METOPROLOL TARTRATE 25 MG PO TABS: ORAL_TABLET | ORAL | 2 refills | Status: DC

## 2022-02-15 MED ORDER — FUROSEMIDE 40 MG PO TABS: 20.0000 mg | ORAL_TABLET | Freq: Every day | ORAL | 2 refills | Status: DC

## 2022-02-15 MED ORDER — ATORVASTATIN CALCIUM 80 MG PO TABS: ORAL_TABLET | ORAL | 2 refills | Status: DC

## 2022-02-15 MED ORDER — POTASSIUM CHLORIDE ER 10 MEQ PO TBCR: EXTENDED_RELEASE_TABLET | ORAL | 2 refills | Status: DC

## 2022-02-15 NOTE — Telephone Encounter (Signed)
*  STAT* If patient is at the pharmacy, call can be transferred to refill team.   1. Which medications need to be refilled? (please list name of each medication and dose if known)    atorvastatin (LIPITOR) 80 MG tablet  furosemide (LASIX) 40 MG tablet  metoprolol tartrate (LOPRESSOR) 25 MG tablet  potassium chloride (KLOR-CON) 10 MEQ tablet    2. Which pharmacy/location (including street and city if local pharmacy) is medication to be sent to? [facility] [place], [place]   3. Do they need a 30 day or 90 day supply? 90 day

## 2022-02-19 ENCOUNTER — Other Ambulatory Visit: Payer: Self-pay

## 2022-02-19 DIAGNOSIS — Z79899 Other long term (current) drug therapy: Secondary | ICD-10-CM

## 2022-02-19 MED ORDER — ROSUVASTATIN CALCIUM 40 MG PO TABS: 40.0000 mg | ORAL_TABLET | Freq: Every day | ORAL | 3 refills | Status: DC

## 2022-02-20 ENCOUNTER — Encounter: Payer: Self-pay | Admitting: Physician Assistant

## 2022-02-20 ENCOUNTER — Ambulatory Visit: Payer: Medicaid Other | Admitting: Physician Assistant

## 2022-02-20 ENCOUNTER — Ambulatory Visit: Payer: Self-pay

## 2022-02-20 ENCOUNTER — Ambulatory Visit (INDEPENDENT_AMBULATORY_CARE_PROVIDER_SITE_OTHER): Payer: Medicaid Other | Admitting: Physician Assistant

## 2022-02-20 VITALS — BP 132/82 | HR 82 | Temp 98.3°F | Ht 68.0 in | Wt 325.0 lb

## 2022-02-20 DIAGNOSIS — R059 Cough, unspecified: Secondary | ICD-10-CM

## 2022-02-20 DIAGNOSIS — R0989 Other specified symptoms and signs involving the circulatory and respiratory systems: Secondary | ICD-10-CM

## 2022-02-20 MED ORDER — ALBUTEROL SULFATE HFA 108 (90 BASE) MCG/ACT IN AERS: 2.0000 | INHALATION_SPRAY | Freq: Four times a day (QID) | RESPIRATORY_TRACT | 2 refills | Status: DC | PRN

## 2022-02-20 NOTE — Progress Notes (Signed)
Established patient visit   Patient: Jon Garner.   DOB: 1965-07-21   57 y.o. Male  MRN: [id number] Visit Date: 02/20/2022  Today's healthcare provider: Mardene Speak, PA-C   No chief complaint on file.  Subjective    URI  This is a new problem. Episode onset: X 2 months. The problem has been gradually worsening. Associated symptoms include abdominal pain, congestion, coughing, rhinorrhea, sinus pain, sneezing, a sore throat and wheezing. He has tried decongestant, acetaminophen and NSAIDs for the symptoms. The treatment provided no relief.    Possible RSV. Mother tested positive yesterday  Medications: Outpatient Medications Prior to Visit  Medication Sig   acetaminophen (TYLENOL) 500 MG tablet Take 1,000 mg by mouth every 6 (six) hours as needed for moderate pain or headache.   aspirin EC 81 MG tablet Take 81 mg by mouth daily. Swallow whole.   benzonatate (TESSALON) 100 MG capsule Take 1 capsule (100 mg total) by mouth 3 (three) times daily as needed for cough.   furosemide (LASIX) 40 MG tablet Take 0.5 tablets (20 mg total) by mouth daily.   metoprolol tartrate (LOPRESSOR) 25 MG tablet TAKE 1 TABLET(25 MG) BY MOUTH TWICE DAILY   Multiple Vitamins-Minerals (PRESERVISION AREDS 2) CAPS Take 2 capsules by mouth 2 (two) times daily.   OVER THE COUNTER MEDICATION Take 2 capsules by mouth daily. NeuroQ   OVER THE COUNTER MEDICATION Take 2 tablets by mouth daily. oneshot keto otc supplement   potassium chloride (KLOR-CON) 10 MEQ tablet TAKE 1 TABLET(10 MEQ) BY MOUTH DAILY   rosuvastatin (CRESTOR) 40 MG tablet Take 1 tablet (40 mg total) by mouth daily.   vitamin C (ASCORBIC ACID) 500 MG tablet Take 500 mg by mouth 4 (four) times a week.    vitamin E 1000 UNIT capsule Take 1,000 Units by mouth 4 (four) times a week.   No facility-administered medications prior to visit.    Review of Systems  HENT:  Positive for congestion, rhinorrhea, sinus pain, sneezing and sore  throat.   Respiratory:  Positive for cough and wheezing.   Gastrointestinal:  Positive for abdominal pain.  All other systems reviewed and are negative. See HPI    Objective    There were no vitals taken for this visit.   Physical Exam Vitals reviewed.  Constitutional:      General: He is in acute distress.     Appearance: Normal appearance. He is not ill-appearing, toxic-appearing or diaphoretic.  HENT:     Head: Normocephalic and atraumatic.     Right Ear: There is impacted cerumen.     Left Ear: Tympanic membrane, ear canal and external ear normal.     Nose: Congestion and rhinorrhea present.     Mouth/Throat:     Pharynx: Posterior oropharyngeal erythema present.  Eyes:     General: No scleral icterus.       Right eye: No discharge.        Left eye: No discharge.     Extraocular Movements: Extraocular movements intact.     Conjunctiva/sclera: Conjunctivae normal.     Pupils: Pupils are equal, round, and reactive to light.  Cardiovascular:     Rate and Rhythm: Normal rate and regular rhythm.     Pulses: Normal pulses.     Heart sounds: Normal heart sounds. No murmur heard. Pulmonary:     Effort: Pulmonary effort is normal. No respiratory distress.     Breath sounds: Normal breath sounds. No wheezing  or rhonchi.  Abdominal:     General: Abdomen is flat. Bowel sounds are normal.     Palpations: Abdomen is soft.  Musculoskeletal:        General: Normal range of motion.     Cervical back: Normal range of motion and neck supple.     Right lower leg: No edema.     Left lower leg: No edema.  Lymphadenopathy:     Cervical: No cervical adenopathy.  Skin:    General: Skin is warm and dry.     Findings: No rash.  Neurological:     General: No focal deficit present.     Mental Status: He is alert and oriented to person, place, and time. Mental status is at baseline.  Psychiatric:        Behavior: Behavior normal.        Thought Content: Thought content normal.     No  results found for any visits on 02/20/22.  Assessment & Plan     1. Cough, unspecified type 2. Upper respiratory symptom  Could be due to Postviral cough syndrome Pt 's vitals WNL including ox satur Normal? Lung exam No wheezing? No fever Continue symptomatic treatment as we discussed : Mucinex. Warm salt gargles. Nasal saline rinse. Flonase. - albuterol (VENTOLIN HFA) 108 (90 Base) MCG/ACT inhaler; Inhale 2 puffs into the lungs every 6 (six) hours as needed for wheezing or shortness of breath.  Dispense: 8 g; Refill: 2 Advised to contact me next week for his status Will FU Discussed isolation precautions  Of note, was seen by Cardiology. BP today WNL, very reassuring.  The patient was advised to call back or seek an in-person evaluation if the symptoms worsen or if the condition fails to improve as anticipated.  I discussed the assessment and treatment plan with the patient. The patient was provided an opportunity to ask questions and all were answered. The patient agreed with the plan and demonstrated an understanding of the instructions.  The entirety of the information documented in the History of Present Illness, Review of Systems and Physical Exam were personally obtained by me. Portions of this information were initially documented by the CMA and reviewed by me for thoroughness and accuracy.  Mardene Speak, [facility], [place] [phone] (phone) [phone] (fax)

## 2022-02-20 NOTE — Telephone Encounter (Signed)
  Chief Complaint: cough  Symptoms: cough and congestion, SOB at times  Frequency: 2 months  Pertinent Negatives: NA Disposition: [] ED /[] Urgent Care (no appt availability in office) / [x] Appointment(In office/virtual)/ []  [facility]/ [] Home Care/ [] Refused Recommended Disposition /[] [facility] [place] [facility]/ []  Follow-up with PCP Additional Notes: pt has been with lingering cough for 2 months and using OTC and no relief. Pt states mother went to ED yesterday was dx with RSV so he feels like he has possibly had this as well. Scheduled OV today at 1540 with PCP and recommended wearing a mask when coming in office. Pt verbalized understanding.   Summary: severe cough/mother tested positive for RSV   Patient was seen in office on 02/12/22 and states that he is still having same symptoms, severe cough. Patients mother was seen in the hospital yesterday and tested positive for RSV. Patient is wanting to some advise if he should be tested or what he can take. Please advise     Reason for Disposition  [1] Continuous (nonstop) coughing interferes with work or school AND [2] no improvement using cough treatment per Care Advice  Answer Assessment - Initial Assessment Questions 1. ONSET: "When did the cough begin?"      2 months  2. SEVERITY: "How bad is the cough today?"      Moderate  3. SPUTUM: "Describe the color of your sputum" (none, dry cough; clear, white, yellow, green)      5. DIFFICULTY BREATHING: "Are you having difficulty breathing?" If Yes, ask: "How bad is it?" (e.g., mild, moderate, severe)    - MILD: No SOB at rest, mild SOB with walking, speaks normally in sentences, can lie down, no retractions, pulse < 100.    - MODERATE: SOB at rest, SOB with minimal exertion and prefers to sit, cannot lie down flat, speaks in phrases, mild retractions, audible wheezing, pulse 100-120.    - SEVERE: Very SOB at rest, speaks in single words, struggling to breathe, sitting hunched forward,  retractions, pulse > 120      SOB at times with cough 6. FEVER: "Do you have a fever?" If Yes, ask: "What is your temperature, how was it measured, and when did it start?"     no 10. OTHER SYMPTOMS: "Do you have any other symptoms?" (e.g., runny nose, wheezing, chest pain)       Cough and congestion  Protocols used: Cough - Acute Productive-A-AH

## 2022-02-26 DIAGNOSIS — H353221 Exudative age-related macular degeneration, left eye, with active choroidal neovascularization: Secondary | ICD-10-CM | POA: Diagnosis not present

## 2022-03-01 ENCOUNTER — Ambulatory Visit: Payer: Medicaid Other | Attending: Internal Medicine

## 2022-03-27 ENCOUNTER — Telehealth: Payer: Self-pay

## 2022-03-27 NOTE — Telephone Encounter (Signed)
Opened in error

## 2022-03-28 ENCOUNTER — Ambulatory Visit: Payer: Medicaid Other | Admitting: Internal Medicine

## 2022-04-08 DIAGNOSIS — H353221 Exudative age-related macular degeneration, left eye, with active choroidal neovascularization: Secondary | ICD-10-CM | POA: Diagnosis not present

## 2022-05-08 ENCOUNTER — Other Ambulatory Visit: Payer: Self-pay | Admitting: Internal Medicine

## 2022-05-08 DIAGNOSIS — I5032 Chronic diastolic (congestive) heart failure: Secondary | ICD-10-CM

## 2022-05-08 DIAGNOSIS — R0602 Shortness of breath: Secondary | ICD-10-CM

## 2022-05-08 DIAGNOSIS — I10 Essential (primary) hypertension: Secondary | ICD-10-CM

## 2022-05-08 DIAGNOSIS — R059 Cough, unspecified: Secondary | ICD-10-CM

## 2022-05-08 DIAGNOSIS — E785 Hyperlipidemia, unspecified: Secondary | ICD-10-CM

## 2022-05-08 DIAGNOSIS — I2510 Atherosclerotic heart disease of native coronary artery without angina pectoris: Secondary | ICD-10-CM

## 2022-05-08 DIAGNOSIS — R61 Generalized hyperhidrosis: Secondary | ICD-10-CM

## 2022-05-15 ENCOUNTER — Other Ambulatory Visit
Admission: RE | Admit: 2022-05-15 | Discharge: 2022-05-15 | Disposition: A | Discharge status: Home / Self Care | Payer: Medicaid Other | Attending: Internal Medicine | Admitting: Internal Medicine

## 2022-05-15 DIAGNOSIS — Z79899 Other long term (current) drug therapy: Secondary | ICD-10-CM | POA: Insufficient documentation

## 2022-05-15 LAB — COMPREHENSIVE METABOLIC PANEL
ALT: 32 U/L (ref 0–44)
AST: 37 U/L (ref 15–41)
Albumin: 3.9 g/dL (ref 3.5–5.0)
Alkaline Phosphatase: 79 U/L (ref 38–126)
Anion gap: 9 (ref 5–15)
BUN: 15 mg/dL (ref 6–20)
CO2: 22 mmol/L (ref 22–32)
Calcium: 8.6 mg/dL — ABNORMAL LOW (ref 8.9–10.3)
Chloride: 109 mmol/L (ref 98–111)
Creatinine, Ser: 0.91 mg/dL (ref 0.61–1.24)
GFR, Estimated: >60 mL/min (ref >60)
Glucose, Bld: 140 mg/dL — ABNORMAL HIGH (ref 70–99)
Potassium: 4 mmol/L (ref 3.5–5.1)
Sodium: 140 mmol/L (ref 135–145)
Total Bilirubin: 0.7 mg/dL (ref 0.3–1.2)
Total Protein: 7.6 g/dL (ref 6.5–8.1)

## 2022-05-15 LAB — LIPID PANEL
Cholesterol: 93 mg/dL (ref 0–200)
HDL: 32 mg/dL — ABNORMAL LOW (ref >40)
LDL Cholesterol: 47 mg/dL (ref 0–99)
Total CHOL/HDL Ratio: 2.9 RATIO
Triglycerides: 69 mg/dL (ref <150)
VLDL: 14 mg/dL (ref 0–40)

## 2022-05-17 ENCOUNTER — Ambulatory Visit: Payer: Medicaid Other | Attending: Internal Medicine

## 2022-05-17 DIAGNOSIS — I5032 Chronic diastolic (congestive) heart failure: Secondary | ICD-10-CM | POA: Diagnosis not present

## 2022-05-17 DIAGNOSIS — R0602 Shortness of breath: Secondary | ICD-10-CM | POA: Diagnosis not present

## 2022-05-17 LAB — ECHOCARDIOGRAM COMPLETE
AR max vel: 2.52 cm2
AV Area VTI: 2.44 cm2
AV Area mean vel: 2.44 cm2
AV Mean grad: 5 mmHg
AV Peak grad: 9.1 mmHg
Ao pk vel: 1.51 m/s
Area-P 1/2: 4.49 cm2
S' Lateral: 3.1 cm

## 2022-05-17 MED ORDER — PERFLUTREN LIPID MICROSPHERE
1.0000 mL | INTRAVENOUS | Status: AC | PRN
  Administered 2022-05-17: 2 mL via INTRAVENOUS

## 2022-05-20 DIAGNOSIS — H353221 Exudative age-related macular degeneration, left eye, with active choroidal neovascularization: Secondary | ICD-10-CM | POA: Diagnosis not present

## 2022-05-29 ENCOUNTER — Ambulatory Visit: Payer: Medicaid Other | Attending: Internal Medicine | Admitting: Internal Medicine

## 2022-05-29 ENCOUNTER — Encounter: Payer: Self-pay | Admitting: Internal Medicine

## 2022-05-29 VITALS — BP 152/84 | HR 75 | Ht 68.0 in | Wt 337.0 lb

## 2022-05-29 DIAGNOSIS — Z79899 Other long term (current) drug therapy: Secondary | ICD-10-CM | POA: Diagnosis not present

## 2022-05-29 DIAGNOSIS — I10 Essential (primary) hypertension: Secondary | ICD-10-CM | POA: Diagnosis not present

## 2022-05-29 DIAGNOSIS — I5032 Chronic diastolic (congestive) heart failure: Secondary | ICD-10-CM | POA: Diagnosis not present

## 2022-05-29 DIAGNOSIS — I2510 Atherosclerotic heart disease of native coronary artery without angina pectoris: Secondary | ICD-10-CM

## 2022-05-29 MED ORDER — FUROSEMIDE 40 MG PO TABS: 40.0000 mg | ORAL_TABLET | Freq: Every day | ORAL | 3 refills | Status: DC

## 2022-05-29 MED ORDER — EMPAGLIFLOZIN 10 MG PO TABS: 10.0000 mg | ORAL_TABLET | Freq: Every day | ORAL | 0 refills | Status: DC

## 2022-05-29 MED ORDER — EMPAGLIFLOZIN 10 MG PO TABS: 10.0000 mg | ORAL_TABLET | Freq: Every day | ORAL | 3 refills | Status: DC

## 2022-05-29 NOTE — Progress Notes (Signed)
Follow-up Outpatient Visit Date: 05/29/2022  Primary Care Provider: Debera Lat, PA-C [address] [place] [address]  Chief Complaint: Follow-up coronary artery disease and HFpEF  HPI:  [name] is a 57 y.o. 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  I last saw him in January, at which time he complained of cold-like symptoms for 4 months.  He had tested negative for COVID-19 and influenza.  Due to chronic cough and dyspnea, we agreed to obtain an echocardiogram and to increase his metoprolol from once daily to twice daily.  Echo showed normal LVEF with grade 2 diastolic dysfunction and mild pulmonary hypertension.  Today, Jon Garner reports that his leg swelling is similar to our last visit.  His legs continue to feel tight.  He notes that he has not used compression stockings in quite some time.  Exertional dyspnea is stable.  He has not had any chest pain or palpitations, though he notes occasional dizziness.  A few nights ago, he also felt like he was "in a twilight zone."  This resolved spontaneously.  He wonders if increased stress surrounding his 67 year old mom who recently suffered an ankle fracture is contributing to some of this.  --------------------------------------------------------------------------------------------------  Past Medical History:  Diagnosis Date   Anemia    h/o age 89   Arthritis    CAD (coronary artery disease)    a. LHC 4/19: pLAD 95%, mLAD 95%, m/dLAD 90%, ostD2 95%, pLCx 80%, mRCA 70%, dRCA 80%; b. 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 Age 7   Past Surgical History:  Procedure Laterality Date   CORONARY ARTERY BYPASS GRAFT N/A 05/29/2017   Procedure: CORONARY ARTERY BYPASS GRAFTING (CABG)  X 4 WITH OPEN HARVESTING OF LEFT AND RIGHT SAPHENOUS VEINS;  Surgeon: Loreli Slot, MD;  Location: [facility];  Service: Open Heart Surgery;  Laterality: N/A;   KNEE SURGERY Bilateral    LEFT HEART CATH AND CORONARY ANGIOGRAPHY N/A 05/26/2017   Procedure: LEFT HEART CATH AND CORONARY ANGIOGRAPHY;  Surgeon: Antonieta Iba, MD;  Location: [facility] INVASIVE CV LAB;  Service: Cardiovascular;  Laterality: N/A;   TEE WITHOUT CARDIOVERSION N/A 05/29/2017   Procedure: TRANSESOPHAGEAL ECHOCARDIOGRAM (TEE);  Surgeon: Loreli Slot, MD;  Location: [facility] OR;  Service: Open Heart Surgery;  Laterality: N/A;   TONSILLECTOMY     age 15   TOTAL KNEE ARTHROPLASTY Right 08/12/2019   Procedure: TOTAL KNEE ARTHROPLASTY;  Surgeon: Juanell Fairly, MD;  Location: [facility] ORS;  Service: Orthopedics;  Laterality: Right;   TOTAL KNEE ARTHROPLASTY Left 12/07/2019   Procedure: LEFT TOTAL KNEE ARTHROPLASTY;  Surgeon: Juanell Fairly, MD;  Location: [facility] ORS;  Service: Orthopedics;  Laterality: Left;    Current Meds  Medication Sig   acetaminophen (TYLENOL) 500 MG tablet Take 1,000 mg by mouth every 6 (six) hours as needed for moderate pain or headache.   albuterol (VENTOLIN HFA) 108 (90 Base) MCG/ACT inhaler Inhale 2 puffs into the  lungs every 6 (six) hours as needed for wheezing or shortness of breath.   aspirin EC 81 MG tablet Take 81 mg by mouth daily. Swallow whole.   furosemide (LASIX) 40 MG tablet Take 0.5 tablets (20 mg total) by mouth daily.   metoprolol tartrate (LOPRESSOR) 25 MG tablet TAKE 1 TABLET(25 MG) BY MOUTH TWICE DAILY   potassium chloride (KLOR-CON) 10 MEQ tablet TAKE 1 TABLET(10 MEQ) BY  MOUTH DAILY   rosuvastatin (CRESTOR) 40 MG tablet Take 1 tablet (40 mg total) by mouth daily.   vitamin C (ASCORBIC ACID) 500 MG tablet Take 500 mg by mouth 4 (four) times a week.    vitamin E 1000 UNIT capsule Take 1,000 Units by mouth 4 (four) times a week.    Allergies: Penicillins  Social History   Tobacco Use   Smoking status: Former    Packs/day: 1.50    Years: 10.00    Additional pack years: 0.00    Total pack years: 15.00    Types: Cigarettes    Quit date: 12/05/2013    Years since quitting: 8.4   Smokeless tobacco: Never  Vaping Use   Vaping Use: Never used  Substance Use Topics   Alcohol use: Not Currently   Drug use: Never    Family History  Problem Relation Age of Onset   Dementia Mother    Other Father        died in his 6's   COPD Father    Multiple sclerosis Sister    COPD Sister    Cancer Sister     Review of Systems: A 12-system review of systems was performed and was negative except as noted in the HPI.  --------------------------------------------------------------------------------------------------  Physical Exam: BP (!) 152/84   Pulse 75   Ht 5\' 8"  (1.727 m)   Wt (!) 337 lb (152.9 kg)   BMI 51.24 kg/m   General:  NAD. Neck: Unable to assess JVP due to body habitus. Lungs: Clear to auscultation bilaterally without wheezes or crackles. Heart: Regular rate and rhythm without murmurs, rubs, or gallops. Abdomen: Soft, nontender, nondistended. Extremities: 2+ chronic appearing edema in both calves.   Lab Results  Component Value Date   WBC 7.3 02/14/2022   HGB 14.5 02/14/2022   HCT 43.7 02/14/2022   MCV 88.5 02/14/2022   PLT 251 02/14/2022    Lab Results  Component Value Date   NA 140 05/15/2022   K 4.0 05/15/2022   CL 109 05/15/2022   CO2 22 05/15/2022   BUN 15 05/15/2022   CREATININE 0.91 05/15/2022   GLUCOSE 140 (H) 05/15/2022   ALT 32 05/15/2022    Lab Results  Component Value Date   CHOL 93 05/15/2022   HDL 32 (L)  05/15/2022   LDLCALC 47 05/15/2022   TRIG 69 05/15/2022   CHOLHDL 2.9 05/15/2022    --------------------------------------------------------------------------------------------------  ASSESSMENT AND PLAN: Chronic HFpEF: Jon Garner reports feeling about the same as at our last visit with continued leg swelling and tightness.  I suspect this is multifactorial including an element of diastolic heart failure as well as venous insufficiency and possible lymphedema.  I have encouraged him to use compression stockings if possible.  We will also increase furosemide to 40 mg daily.  Additionally, we have agreed to add empagliflozin 10 mg daily.  We will plan to check a BMP in 2 weeks to ensure stable renal function and potassium.  Coronary artery disease and hyperlipidemia: No angina reported status post CABG in 2019.  Continue aspirin and rosuvastatin for secondary prevention; LDL at goal on last check.  Hypertension: Blood pressure suboptimally controlled today, though Jon Garner reports readings are typically better.  Volume overload may be contributing.  As above, we will increase lisinopril and also add empagliflozin, which may help lower his blood pressure a little.  Continue current dose of metoprolol.  If blood pressure remains suboptimally controlled, low threshold for adding ARB/ARNI.  Morbid obesity: BMI remains greater than 50, likely playing a large role in edema and dyspnea.  Weight loss encouraged through diet and exercise.  Follow-up: Return to clinic in 6 weeks.  Yvonne Kendall, MD 05/29/2022 10:24 AM

## 2022-05-29 NOTE — Patient Instructions (Addendum)
Medication Instructions:  Your physician recommends the following medication changes.  START TAKING: Jardiance 10 mg by mouth daily (samples provided)  INCREASE: Lasix to 40 mg by mouth daily    *If you need a refill on your cardiac medications before your next appointment, please call your pharmacy*   Lab Work: Your provider would like for you to return in 2 weeks to have the following labs drawn: (BMP).   Please go to the [facility] entrance and check in at the front desk.  You do not need an appointment.  They are open from 7am-6 pm.  You will not need to be fasting.  If you have labs (blood work) drawn today and your tests are completely normal, you will receive your results only by: MyChart Message (if you have MyChart) OR A paper copy in the mail If you have any lab test that is abnormal or we need to change your treatment, we will call you to review the results.   Testing/Procedures: None ordered   Follow-Up: At [facility], you and your health needs are our priority.  As part of our continuing mission to provide you with exceptional heart care, we have created designated Provider Care Teams.  These Care Teams include your primary Cardiologist (physician) and Advanced Practice Providers (APPs -  Physician Assistants and Nurse Practitioners) who all work together to provide you with the care you need, when you need it.  We recommend signing up for the patient portal called "MyChart".  Sign up information is provided on this After Visit Summary.  MyChart is used to connect with patients for Virtual Visits (Telemedicine).  Patients are able to view lab/test results, encounter notes, upcoming appointments, etc.  Non-urgent messages can be sent to your provider as well.   To learn more about what you can do with MyChart, go to [email or web address].    Your next appointment:   6 week(s)  Provider:   You may see Yvonne Kendall, MD or one of the following  Advanced Practice Providers on your designated Care Team:   Nicolasa Ducking, NP Eula Listen, PA-C Cadence Fransico Michael, PA-C Charlsie Quest, NP

## 2022-05-31 ENCOUNTER — Encounter: Payer: Self-pay | Admitting: Internal Medicine

## 2022-06-19 ENCOUNTER — Other Ambulatory Visit (HOSPITAL_COMMUNITY): Payer: Self-pay

## 2022-06-26 ENCOUNTER — Other Ambulatory Visit (HOSPITAL_COMMUNITY): Payer: Self-pay

## 2022-06-26 ENCOUNTER — Telehealth: Payer: Self-pay | Admitting: Cardiovascular Disease

## 2022-06-26 ENCOUNTER — Telehealth: Payer: Self-pay

## 2022-06-26 NOTE — Telephone Encounter (Signed)
Pt c/o medication issue:  1. Name of Medication:  empagliflozin (JARDIANCE) 10 MG TABS tablet   2. How are you currently taking this medication (dosage and times per day)? As prescribed   3. Are you having a reaction (difficulty breathing--STAT)? No   4. What is your medication issue? Patient states pharmacy advised him they have faxed a PA request for this medication. Patient only has a few tablets left, but was unable to get it due to not having PA.   Please advise.

## 2022-06-26 NOTE — Telephone Encounter (Signed)
Pharmacy Patient Advocate Encounter   Received notification from RN that prior authorization for JARDIANCE 10MG  is required/requested.   PA submitted on 5.22.24 to (ins) [place] [facility] via [employer] or [facility]) confirmation # [id number]   Status is pending

## 2022-06-27 NOTE — Telephone Encounter (Signed)
Spoke with patient and informed him that the PA for the empagliflozin (JARDIANCE) 10 MG TABS tablet has been approved. Patient stated that he would pick it up from the pharmacy now.

## 2022-06-27 NOTE — Telephone Encounter (Signed)
Pharmacy Patient Advocate Encounter  Prior Authorization for [facility] has been approved.    PA# [id number] Effective dates: 06/12/22 through 06/26/23  Haze Rushing, CPhT Pharmacy Patient Advocate Specialist Direct Number: [phone] Fax: [phone]

## 2022-07-08 ENCOUNTER — Other Ambulatory Visit
Admission: RE | Admit: 2022-07-08 | Discharge: 2022-07-08 | Disposition: A | Discharge status: Home / Self Care | Payer: Medicaid Other | Attending: Internal Medicine | Admitting: Internal Medicine

## 2022-07-08 DIAGNOSIS — Z79899 Other long term (current) drug therapy: Secondary | ICD-10-CM | POA: Diagnosis not present

## 2022-07-08 DIAGNOSIS — H353221 Exudative age-related macular degeneration, left eye, with active choroidal neovascularization: Secondary | ICD-10-CM | POA: Diagnosis not present

## 2022-07-08 LAB — BASIC METABOLIC PANEL
Anion gap: 9 (ref 5–15)
BUN: 14 mg/dL (ref 6–20)
CO2: 21 mmol/L — ABNORMAL LOW (ref 22–32)
Calcium: 8.6 mg/dL — ABNORMAL LOW (ref 8.9–10.3)
Chloride: 107 mmol/L (ref 98–111)
Creatinine, Ser: 0.95 mg/dL (ref 0.61–1.24)
GFR, Estimated: >60 mL/min (ref >60)
Glucose, Bld: 138 mg/dL — ABNORMAL HIGH (ref 70–99)
Potassium: 4.3 mmol/L (ref 3.5–5.1)
Sodium: 137 mmol/L (ref 135–145)

## 2022-07-10 ENCOUNTER — Ambulatory Visit: Payer: Medicaid Other | Attending: Internal Medicine | Admitting: Internal Medicine

## 2022-07-10 ENCOUNTER — Encounter: Payer: Self-pay | Admitting: Internal Medicine

## 2022-07-10 VITALS — BP 142/70 | HR 76 | Ht 68.0 in | Wt 338.5 lb

## 2022-07-10 DIAGNOSIS — I10 Essential (primary) hypertension: Secondary | ICD-10-CM | POA: Diagnosis not present

## 2022-07-10 DIAGNOSIS — R400 Somnolence: Secondary | ICD-10-CM | POA: Diagnosis not present

## 2022-07-10 DIAGNOSIS — I2510 Atherosclerotic heart disease of native coronary artery without angina pectoris: Secondary | ICD-10-CM | POA: Diagnosis not present

## 2022-07-10 DIAGNOSIS — Z79899 Other long term (current) drug therapy: Secondary | ICD-10-CM

## 2022-07-10 DIAGNOSIS — I5032 Chronic diastolic (congestive) heart failure: Secondary | ICD-10-CM | POA: Diagnosis not present

## 2022-07-10 DIAGNOSIS — R0683 Snoring: Secondary | ICD-10-CM

## 2022-07-10 MED ORDER — EMPAGLIFLOZIN 10 MG PO TABS: 10.0000 mg | ORAL_TABLET | Freq: Every day | ORAL | 3 refills | Status: DC

## 2022-07-10 MED ORDER — FUROSEMIDE 40 MG PO TABS: 40.0000 mg | ORAL_TABLET | Freq: Two times a day (BID) | ORAL | 3 refills | Status: DC

## 2022-07-10 NOTE — Progress Notes (Unsigned)
Follow-up Outpatient Visit Date: 07/10/2022  Primary Care Provider: Debera Lat, PA-C [address] [place] [address]  Chief Complaint: Follow-up coronary artery disease and HFpEF  HPI:  Mr. Pappa is a 57 y.o. 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  I last saw him in late April, at which time he complained of continued leg swelling and exertional dyspnea.  I recommended compression stocking use; we also increased furosemide to 40 mg daily and added empagliflozin 10 mg daily.  Follow-up BMP 2 days ago showed stable renal function and electrolytes.  Today, Mr. Delligatti reports that he is doing a little better with less leg swelling.  Leg edema has not completely resolved despite escalation of furosemide, addition of empagliflozin, and regular compression stocking use.  His weight has been fairly stable.  He still has mild shortness of breath with activity.  He is working on establishing a fitness routine.  He denies chest pain, palpitations, and lightheadedness.  Home blood pressures are typically 126-138/75-80.  Mr. Lowell notes that he has some daytime fatigue and does not feel refreshed in the mornings.  He has been told that he continues to snore.  We previously discussed sleep study evaluation but deferred this at his request.  He would not like to move forward with this.  --------------------------------------------------------------------------------------------------  Past Medical History:  Diagnosis Date   Anemia    h/o age 56   Arthritis    CAD (coronary artery disease)    a. LHC 4/19: pLAD 95%, mLAD 95%, m/dLAD 90%, ostD2 95%, pLCx 80%, mRCA 70%, dRCA 80%; b. 4-V CABG 05/2017:  LIMA-LAD, VG-D1, VG-PDA, VG-OM2   Essential hypertension 2014   GERD (gastroesophageal reflux disease) 2015   h/o   History of kidney stones    h/o   Morbid obesity (HCC) presently   Myocardial infarction (HCC) 2019   quadruple bypass   PXE (pseudoxanthoma elasticum) 1992   Snores presently   Stroke [facility]) 1992   a. Age 60   Past Surgical History:  Procedure Laterality Date   CORONARY ARTERY BYPASS GRAFT N/A 05/29/2017   Procedure: CORONARY ARTERY BYPASS GRAFTING (CABG)  X 4 WITH OPEN HARVESTING OF LEFT AND RIGHT SAPHENOUS VEINS;  Surgeon: Loreli Slot, MD;  Location: [facility] OR;  Service: Open Heart Surgery;  Laterality: N/A;   KNEE SURGERY Bilateral    LEFT HEART CATH AND CORONARY ANGIOGRAPHY N/A 05/26/2017   Procedure: LEFT HEART CATH AND CORONARY ANGIOGRAPHY;  Surgeon: Antonieta Iba, MD;  Location: [facility] INVASIVE CV LAB;  Service: Cardiovascular;  Laterality: N/A;   TEE WITHOUT CARDIOVERSION N/A 05/29/2017   Procedure: TRANSESOPHAGEAL ECHOCARDIOGRAM (TEE);  Surgeon: Loreli Slot, MD;  Location[facility] OR;  Service: Open Heart Surgery;  Laterality: N/A;   TONSILLECTOMY     age 89   TOTAL KNEE ARTHROPLASTY Right 08/12/2019   Procedure: TOTAL KNEE ARTHROPLASTY;  Surgeon: Juanell Fairly, MD;  Location: [facility] ORS;  Service: Orthopedics;  Laterality: Right;   TOTAL KNEE ARTHROPLASTY Left 12/07/2019   Procedure: LEFT TOTAL KNEE ARTHROPLASTY;  Surgeon: Juanell Fairly, MD;  Location: [facility] ORS;  Service: Orthopedics;  Laterality: Left;     Current Meds  Medication Sig   acetaminophen (TYLENOL) 500 MG tablet Take 1,000 mg by mouth every 6 (  six) hours as needed for moderate pain or headache.   aspirin EC 81 MG tablet Take 81 mg by mouth daily. Swallow whole.   empagliflozin (JARDIANCE) 10 MG TABS tablet Take 1 tablet (10 mg total) by mouth daily before breakfast. Lot # [id number] Exp. 1/26   furosemide (LASIX) 40 MG tablet Take 1 tablet (40 mg total) by mouth daily.   metoprolol  tartrate (LOPRESSOR) 25 MG tablet TAKE 1 TABLET(25 MG) BY MOUTH TWICE DAILY   potassium chloride (KLOR-CON) 10 MEQ tablet TAKE 1 TABLET(10 MEQ) BY MOUTH DAILY   rosuvastatin (CRESTOR) 40 MG tablet Take 1 tablet (40 mg total) by mouth daily.   vitamin C (ASCORBIC ACID) 500 MG tablet Take 500 mg by mouth 4 (four) times a week.    vitamin E 1000 UNIT capsule Take 1,000 Units by mouth 4 (four) times a week.    Allergies: Penicillins  Social History   Tobacco Use   Smoking status: Former    Packs/day: 1.50    Years: 10.00    Additional pack years: 0.00    Total pack years: 15.00    Types: Cigarettes    Quit date: 12/05/2013    Years since quitting: 8.6   Smokeless tobacco: Never  Vaping Use   Vaping Use: Never used  Substance Use Topics   Alcohol use: Not Currently   Drug use: Never    Family History  Problem Relation Age of Onset   Dementia Mother    Other Father        died in his 68's   COPD Father    Multiple sclerosis Sister    COPD Sister    Cancer Sister     Review of Systems: A 12-system review of systems was performed and was negative except as noted in the HPI.  --------------------------------------------------------------------------------------------------  Physical Exam: BP (!) 142/70 (BP Location: Left Arm)   Pulse 76   Ht 5\' 8"  (1.727 m)   Wt (!) 338 lb 8 oz (153.5 kg)   SpO2 97%   BMI 51.47 kg/m  Initial blood pressure: 140/80  General:  NAD. Neck: No JVD or HJR. Lungs: Clear to auscultation bilaterally without wheezes or crackles. Heart: Regular rate and rhythm without murmurs, rubs, or gallops. Abdomen: Soft, nontender, nondistended. Extremities: 1+ pretibial edema bilaterally.  EKG: Normal sinus rhythm with left axis deviation and possible inferior infarct.  Notes Kniffen change from prior tracing on 02/14/2022.  Lab Results  Component Value Date   WBC 7.3 02/14/2022   HGB 14.5 02/14/2022   HCT 43.7 02/14/2022   MCV 88.5 02/14/2022    PLT 251 02/14/2022    Lab Results  Component Value Date   NA 137 07/08/2022   K 4.3 07/08/2022   CL 107 07/08/2022   CO2 21 (L) 07/08/2022   BUN 14 07/08/2022   CREATININE 0.95 07/08/2022   GLUCOSE 138 (H) 07/08/2022   ALT 32 05/15/2022    Lab Results  Component Value Date   CHOL 93 05/15/2022   HDL 32 (L) 05/15/2022   LDLCALC 47 05/15/2022   TRIG 69 05/15/2022   CHOLHDL 2.9 05/15/2022    --------------------------------------------------------------------------------------------------  ASSESSMENT AND PLAN: Chronic HFpEF: Mr. Veltri leg edema has improved from our last visit though still has some leg edema on exam today.  I have encouraged him to continue using compression stockings and to increase furosemide to 40 mg twice daily.  Continue empagliflozin 10 mg daily.  Will plan to check a BMP in about 2  weeks and have him return for reevaluation in 2 months.  Coronary artery disease: No angina reported.  Continue aspirin and rosuvastatin for secondary prevention.  Hypertension: Blood pressure remains mildly above goal.  We will increase furosemide, which may offer little benefit.  Continue current dose of metoprolol.  Low threshold for adding ARB if blood pressure remains consistently at or above 140/90.  Daytime somnolence and fatigue: Mr. Grim notes that he snores and frequently has daytime somnolence.  We have previously discussed sleep evaluation no Mr. Colomb deferred.  He is now interested in moving forward.  We will refer him for sleep study.  Morbid obesity: Likely playing a role in leg edema and exertional dyspnea.  I encouraged Mr. Prudencio to keep working on weight loss through diet and exercise.  Follow-up: Return to clinic in 2 months.  Yvonne Kendall, MD 07/10/2022 10:59 AM

## 2022-07-10 NOTE — Patient Instructions (Addendum)
Medication Instructions:  Your physician recommends the following medication changes.  INCREASE: Lasix to 40 mg by mouth twice a day   *If you need a refill on your cardiac medications before your next appointment, please call your pharmacy*   Lab Work: Your provider would like for you to return in 2 weeks to have the following labs drawn: (BMP).   Please go to the [facility] entrance and check in at the front desk.  You do not need an appointment.  They are open from 7am-6 pm.  You will not need to be fasting.  If you have labs (blood work) drawn today and your tests are completely normal, you will receive your results only by: MyChart Message (if you have MyChart) OR A paper copy in the mail If you have any lab test that is abnormal or we need to change your treatment, we will call you to review the results.   Testing/Procedures: None ordered today   Follow-Up: At [facility], you and your health needs are our priority.  As part of our continuing mission to provide you with exceptional heart care, we have created designated Provider Care Teams.  These Care Teams include your primary Cardiologist (physician) and Advanced Practice Providers (APPs -  Physician Assistants and Nurse Practitioners) who all work together to provide you with the care you need, when you need it.  We recommend signing up for the patient portal called "MyChart".  Sign up information is provided on this After Visit Summary.  MyChart is used to connect with patients for Virtual Visits (Telemedicine).  Patients are able to view lab/test results, encounter notes, upcoming appointments, etc.  Non-urgent messages can be sent to your provider as well.   To learn more about what you can do with MyChart, go to [email or web address].    Your next appointment:   2 month(s)  Provider:   You may see Yvonne Kendall, MD or one of the following Advanced Practice Providers on your designated Care Team:    Nicolasa Ducking, NP Eula Listen, PA-C Cadence Fransico Michael, PA-C Charlsie Quest, NP

## 2022-07-11 ENCOUNTER — Encounter: Payer: Self-pay | Admitting: Internal Medicine

## 2022-07-11 DIAGNOSIS — R400 Somnolence: Secondary | ICD-10-CM | POA: Insufficient documentation

## 2022-07-11 DIAGNOSIS — R0683 Snoring: Secondary | ICD-10-CM | POA: Insufficient documentation

## 2022-08-07 DIAGNOSIS — S76011A Strain of muscle, fascia and tendon of right hip, initial encounter: Secondary | ICD-10-CM | POA: Diagnosis not present

## 2022-08-21 DIAGNOSIS — M545 Low back pain, unspecified: Secondary | ICD-10-CM | POA: Diagnosis not present

## 2022-08-21 DIAGNOSIS — S76011A Strain of muscle, fascia and tendon of right hip, initial encounter: Secondary | ICD-10-CM | POA: Diagnosis not present

## 2022-09-02 DIAGNOSIS — H353221 Exudative age-related macular degeneration, left eye, with active choroidal neovascularization: Secondary | ICD-10-CM | POA: Diagnosis not present

## 2022-09-05 ENCOUNTER — Other Ambulatory Visit
Admission: RE | Admit: 2022-09-05 | Discharge: 2022-09-05 | Disposition: A | Discharge status: Home / Self Care | Payer: Medicaid Other | Source: Ambulatory Visit | Attending: Internal Medicine | Admitting: Internal Medicine

## 2022-09-05 ENCOUNTER — Encounter: Payer: Self-pay | Admitting: Internal Medicine

## 2022-09-05 ENCOUNTER — Ambulatory Visit (INDEPENDENT_AMBULATORY_CARE_PROVIDER_SITE_OTHER): Payer: Medicaid Other | Admitting: Internal Medicine

## 2022-09-05 VITALS — BP 132/78 | HR 96 | Temp 97.6°F | Ht 68.0 in | Wt 340.8 lb

## 2022-09-05 DIAGNOSIS — Z79899 Other long term (current) drug therapy: Secondary | ICD-10-CM | POA: Diagnosis not present

## 2022-09-05 DIAGNOSIS — G4733 Obstructive sleep apnea (adult) (pediatric): Secondary | ICD-10-CM

## 2022-09-05 LAB — BASIC METABOLIC PANEL
Anion gap: 11 (ref 5–15)
BUN: 18 mg/dL (ref 6–20)
CO2: 22 mmol/L (ref 22–32)
Calcium: 8.7 mg/dL — ABNORMAL LOW (ref 8.9–10.3)
Chloride: 106 mmol/L (ref 98–111)
Creatinine, Ser: 0.95 mg/dL (ref 0.61–1.24)
GFR, Estimated: >60 mL/min (ref >60)
Glucose, Bld: 101 mg/dL — ABNORMAL HIGH (ref 70–99)
Potassium: 3.9 mmol/L (ref 3.5–5.1)
Sodium: 139 mmol/L (ref 135–145)

## 2022-09-05 NOTE — Progress Notes (Signed)
Name: Jon Garner. MRN: [id number] DOB: 03-31-1965    CHIEF COMPLAINT:  EXCESSIVE DAYTIME SLEEPINESS   HISTORY OF PRESENT ILLNESS: Patient is seen today for problems and issues with sleep related to excessive daytime sleepiness Patient  has been having sleep problems for many years Patient has been having excessive daytime sleepiness for a long time Patient has been having extreme fatigue and tiredness, lack of energy +  very Loud snoring every night + struggling breathe at night and gasps for air + morning headaches + Nonrefreshing sleep  Encouraged proper weight management.  Discussed driving precautions and its relationship with hypersomnolence.  Discussed operating dangerous equipment and its relationship with hypersomnolence.  Discussed sleep hygiene, and benefits of a fixed sleep waked time.  The importance of getting eight or more hours of sleep discussed with patient.  Discussed limiting the use of the computer and television before bedtime.  Decrease naps during the day, so night time sleep will become enhanced.  Limit caffeine, and sleep deprivation.  HTN, stroke, and heart failure are potential risk factors.    EPWORTH SLEEP SCORE 17  Patient is morbidly obese at 340 pounds Has gained weight over the last several years Has had bilateral knee surgeries Uses a walker Patient does have cardiology issues Sees Dr. Okey Dupre  No exacerbation at this time No evidence of heart failure at this time No evidence or signs of infection at this time No respiratory distress No fevers, chills, nausea, vomiting, diarrhea No evidence of lower extremity edema No evidence hemoptysis   PAST MEDICAL HISTORY :   has a past medical history of Anemia, Arthritis, CAD (coronary artery disease), Essential hypertension (2014), GERD (gastroesophageal reflux disease) (2015), History of kidney stones, Morbid obesity (HCC) (presently), Myocardial infarction (HCC) (2019), PXE  (pseudoxanthoma elasticum) (1992), Snores (presently), and Stroke (HCC) (1992).  has a past surgical history that includes Knee surgery (Bilateral); LEFT HEART CATH AND CORONARY ANGIOGRAPHY (N/A, 05/26/2017); Coronary artery bypass graft (N/A, 05/29/2017); TEE without cardioversion (N/A, 05/29/2017); Tonsillectomy; Total knee arthroplasty (Right, 08/12/2019); and Total knee arthroplasty (Left, 12/07/2019). Prior to Admission medications   Medication Sig Start Date End Date Taking? Authorizing Provider  acetaminophen (TYLENOL) 500 MG tablet Take 1,000 mg by mouth every 6 (six) hours as needed for moderate pain or headache.    [provider]  albuterol (VENTOLIN HFA) 108 (90 Base) MCG/ACT inhaler Inhale 2 puffs into the lungs every 6 (six) hours as needed for wheezing or shortness of breath. Patient not taking: Reported on 07/10/2022 02/20/22   Debera Lat, PA-C  aspirin EC 81 MG tablet Take 81 mg by mouth daily. Swallow whole.    [provider]  empagliflozin (JARDIANCE) 10 MG TABS tablet Take 1 tablet (10 mg total) by mouth daily before breakfast. Lot # [id number] Exp. 1/26 07/10/22   End, Cristal Deer, MD  furosemide (LASIX) 40 MG tablet Take 1 tablet (40 mg total) by mouth 2 (two) times daily. 07/10/22   End, Cristal Deer, MD  metoprolol tartrate (LOPRESSOR) 25 MG tablet TAKE 1 TABLET(25 MG) BY MOUTH TWICE DAILY 02/15/22   End, Cristal Deer, MD  potassium chloride (KLOR-CON) 10 MEQ tablet TAKE 1 TABLET(10 MEQ) BY MOUTH DAILY 02/15/22   End, Cristal Deer, MD  rosuvastatin (CRESTOR) 40 MG tablet Take 1 tablet (40 mg total) by mouth daily. 02/19/22 02/14/23  End, Cristal Deer, MD  vitamin C (ASCORBIC ACID) 500 MG tablet Take 500 mg by mouth 4 (four) times a week.     [provider]  vitamin E 1000 UNIT capsule Take 1,000 Units by mouth 4 (four) times a week.    [provider]   Allergies  Allergen Reactions   Penicillins Other (See Comments)    Family has had severe reactions to  penicillins       FAMILY HISTORY:  family history includes COPD in his father and sister; Cancer in his sister; Dementia in his mother; Multiple sclerosis in his sister; Other in his father. SOCIAL HISTORY:  reports that he quit smoking about 8 years ago. His smoking use included cigarettes. He started smoking about 18 years ago. He has a 15 pack-year smoking history. He has never used smokeless tobacco. He reports that he does not currently use alcohol. He reports that he does not use drugs.   Review of Systems:  Gen:  Denies  fever, sweats, chills weight loss  HEENT: Denies blurred vision, double vision, ear pain, eye pain, hearing loss, nose bleeds, sore throat Cardiac:  No dizziness, chest pain or heaviness, chest tightness,edema, No JVD Resp:   No cough, -sputum production, -shortness of breath,-wheezing, -hemoptysis,  Gi: Denies swallowing difficulty, stomach pain, nausea or vomiting, diarrhea, constipation, bowel incontinence Gu:  Denies bladder incontinence, burning urine Ext:   Denies Joint pain, stiffness or swelling Skin: Denies  skin rash, easy bruising or bleeding or hives Endoc:  Denies polyuria, polydipsia , polyphagia or weight change Psych:   Denies depression, insomnia or hallucinations  Other:  All other systems negative   ALL OTHER ROS ARE NEGATIVE   BP 132/78 (BP Location: Left Arm, Cuff Size: Large)   Pulse 96   Temp 97.6 F (36.4 C) (Temporal)   Ht 5\' 8"  (1.727 m)   Wt (!) 340 lb 12.8 oz (154.6 kg)   SpO2 95%   BMI 51.82 kg/m     Physical Examination:   General Appearance: No distress  EYES PERRLA, EOM intact.   NECK Supple, No JVD Pulmonary: normal breath sounds, No wheezing.  CardiovascularNormal S1,S2.  No m/r/g.   Abdomen: Benign, Soft, non-tender. Skin:   warm, no rashes, no ecchymosis  Extremities: normal, no cyanosis, clubbing. Neuro:without focal findings,  speech normal  PSYCHIATRIC: Mood, affect within normal limits.   ALL OTHER  ROS ARE NEGATIVE    ASSESSMENT AND PLAN SYNOPSIS  Patient with signs and symptoms of excessive daytime sleepiness with probable underlying diagnosis of obstructive sleep apnea in the setting of obesity and deconditioned state   Recommend split-night sleep Study for definitve diagnosis  Obesity -recommend significant weight loss -recommend changing diet  Deconditioned state -Recommend increased daily activity and exercise   MEDICATION ADJUSTMENTS/LABS AND TESTS ORDERED: Recommend split-night sleep Study Recommend weight loss   CURRENT MEDICATIONS REVIEWED AT LENGTH WITH PATIENT TODAY   Patient  satisfied with Plan of action and management. All questions answered  Follow up  3 months  Total Time Spent  35 mins   Wallis Bamberg Santiago Glad, M.D.  [facility]  Medical Director [facility] Medical Director [facility] Cardio-Pulmonary Department

## 2022-09-05 NOTE — Patient Instructions (Signed)
Recommend Split Night Sleep Study for further assessment Recommend weight loss

## 2022-09-11 DIAGNOSIS — S76211D Strain of adductor muscle, fascia and tendon of right thigh, subsequent encounter: Secondary | ICD-10-CM | POA: Diagnosis not present

## 2022-09-11 DIAGNOSIS — R2689 Other abnormalities of gait and mobility: Secondary | ICD-10-CM | POA: Diagnosis not present

## 2022-09-19 ENCOUNTER — Encounter: Payer: Self-pay | Admitting: Physician Assistant

## 2022-09-19 ENCOUNTER — Telehealth: Payer: Self-pay

## 2022-09-19 ENCOUNTER — Other Ambulatory Visit: Payer: Self-pay | Admitting: Physician Assistant

## 2022-09-19 DIAGNOSIS — R2689 Other abnormalities of gait and mobility: Secondary | ICD-10-CM | POA: Diagnosis not present

## 2022-09-19 DIAGNOSIS — N489 Disorder of penis, unspecified: Secondary | ICD-10-CM

## 2022-09-19 DIAGNOSIS — S76211D Strain of adductor muscle, fascia and tendon of right thigh, subsequent encounter: Secondary | ICD-10-CM | POA: Diagnosis not present

## 2022-09-19 NOTE — Telephone Encounter (Signed)
Duplicate. Referral was placed this morning

## 2022-09-19 NOTE — Telephone Encounter (Signed)
Copied from CRM [id number]. Topic: Referral - Request for Referral >> Sep 19, 2022 10:23 AM Macon Large wrote: Has patient seen PCP for this complaint? No. *If NO, is insurance requiring patient see PCP for this issue before PCP can refer them? Referral for which specialty: Urology Preferred provider/office: no specific provider or location Reason for referral: head of my penis being stuck below the circumcised tissue

## 2022-09-24 ENCOUNTER — Ambulatory Visit: Payer: Medicaid Other

## 2022-09-28 NOTE — Progress Notes (Unsigned)
Cardiology Office Note  Date:  09/30/2022   ID:  Jon Garner., DOB 04/06/1965, MRN [id number]  PCP:  Debera Lat, PA-C   Chief Complaint  Patient presents with   2 month follow up     Patient c/o bilateral LE edema. Medications reviewed by the patient verbally.     HPI:  [name] is a 57 y.o. 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  Followed by Dr. Okey Dupre  For unclear reasons was scheduled on my schedule today, typically sees Dr. Okey Dupre Reports Chronic leg swelling and exertional dyspnea.   compression stocking previously recommended furosemide to 40 mg twice daily,empagliflozin 10 mg daily.    BMP reviewed, renal function stable  Sleep test tonight  Applied for disability given chronic knee pain, unable to ambulate without a walker  EKG personally reviewed by myself on todays visit EKG Interpretation Date/Time:  Monday September 30 2022 11:23:23 EDT Ventricular Rate:  86 PR Interval:  152 QRS Duration:  70 QT Interval:  370 QTC Calculation: 442 R Axis:   -36  Text Interpretation: Normal sinus rhythm Left axis deviation Inferior infarct (cited on or before 25-May-2017) Cannot rule out Anterior infarct , age undetermined When compared with ECG of 30-May-2017 06:37, Minimal criteria for Anterior infarct are now Present Confirmed by Jon Garner [phone]) on 09/30/2022 11:31:54 AM    PMH:   has a past medical history of Anemia, Arthritis, CAD (coronary artery disease), Essential hypertension (2014), GERD (gastroesophageal reflux disease) (2015), History of kidney stones, Morbid obesity (HCC) (presently), Myocardial infarction (HCC) (2019), PXE (pseudoxanthoma elasticum)  (1992), Snores (presently), and Stroke (HCC) (1992).  PSH:    Past Surgical History:  Procedure Laterality Date   CORONARY ARTERY BYPASS GRAFT N/A 05/29/2017   Procedure: CORONARY ARTERY BYPASS GRAFTING (CABG)  X 4 WITH OPEN HARVESTING OF LEFT AND RIGHT SAPHENOUS VEINS;  Surgeon: Loreli Slot, MD;  Location: [facility] OR;  Service: Open Heart Surgery;  Laterality: N/A;   KNEE SURGERY Bilateral    LEFT HEART CATH AND CORONARY ANGIOGRAPHY N/A 05/26/2017   Procedure: LEFT HEART CATH AND CORONARY ANGIOGRAPHY;  Surgeon: Antonieta Iba, MD;  Location: [facility] INVASIVE CV LAB;  Service: Cardiovascular;  Laterality: N/A;   TEE WITHOUT CARDIOVERSION N/A 05/29/2017   Procedure: TRANSESOPHAGEAL ECHOCARDIOGRAM (TEE);  Surgeon: Loreli Slot, MD;  Location: [facility];  Service: Open Heart Surgery;  Laterality: N/A;   TONSILLECTOMY     age 41   TOTAL KNEE ARTHROPLASTY Right 08/12/2019   Procedure: TOTAL KNEE ARTHROPLASTY;  Surgeon: Juanell Fairly, MD;  Location: [facility] ORS;  Service: Orthopedics;  Laterality: Right;   TOTAL KNEE ARTHROPLASTY Left 12/07/2019   Procedure: LEFT TOTAL KNEE ARTHROPLASTY;  Surgeon: Juanell Fairly, MD;  Location: [facility] ORS;  Service: Orthopedics;  Laterality: Left;    Current Outpatient Medications  Medication Sig Dispense Refill   acetaminophen (TYLENOL) 500 MG tablet Take 1,000 mg by mouth every 6 (six) hours as needed for moderate pain or headache.     aspirin EC 81 MG tablet Take 81 mg by mouth daily. Swallow whole.     empagliflozin (JARDIANCE) 10 MG TABS tablet Take 1 tablet (10 mg total) by mouth daily before breakfast. Lot # [id number]  Exp. 1/26 90 tablet 3   furosemide (LASIX) 40 MG tablet Take 1 tablet (40 mg total) by mouth 2 (two) times daily. 180 tablet 3   meloxicam (MOBIC) 7.5 MG tablet Take 7.5 mg by mouth daily.     metoprolol tartrate (LOPRESSOR) 25 MG tablet TAKE 1 TABLET(25 MG) BY MOUTH TWICE DAILY 180 tablet 2   potassium chloride (KLOR-CON) 10 MEQ tablet  TAKE 1 TABLET(10 MEQ) BY MOUTH DAILY 90 tablet 2   rosuvastatin (CRESTOR) 40 MG tablet Take 1 tablet (40 mg total) by mouth daily. 90 tablet 3   vitamin C (ASCORBIC ACID) 500 MG tablet Take 500 mg by mouth 4 (four) times a week.      vitamin E 1000 UNIT capsule Take 1,000 Units by mouth 4 (four) times a week.     albuterol (VENTOLIN HFA) 108 (90 Base) MCG/ACT inhaler Inhale 2 puffs into the lungs every 6 (six) hours as needed for wheezing or shortness of breath. (Patient not taking: Reported on 07/10/2022) 8 g 2   No current facility-administered medications for this visit.    Allergies:   Penicillins   Social History:  The patient  reports that he quit smoking about 8 years ago. His smoking use included cigarettes. He started smoking about 18 years ago. He has a 15 pack-year smoking history. He has never used smokeless tobacco. He reports that he does not currently use alcohol. He reports that he does not use drugs.   Family History:   family history includes COPD in his father and sister; Cancer in his sister; Dementia in his mother; Multiple sclerosis in his sister; Other in his father.    Review of Systems: Review of Systems  Constitutional: Negative.   HENT: Negative.    Respiratory: Negative.    Cardiovascular:  Positive for leg swelling.  Gastrointestinal: Negative.   Musculoskeletal:  Positive for joint pain.       Gait instability  Neurological: Negative.   Psychiatric/Behavioral: Negative.    All other systems reviewed and are negative.   PHYSICAL EXAM: VS:  BP (!) 130/50 (BP Location: Left Arm, Patient Position: Sitting, Cuff Size: Large)   Ht 5\' 8"  (1.727 m)   Wt (!) 341 lb (154.7 kg)   SpO2 96%   BMI 51.85 kg/m  , BMI Body mass index is 51.85 kg/m. GEN: Well nourished, well developed, in no acute distress HEENT: normal Neck: no JVD, carotid bruits, or masses Cardiac: RRR; no murmurs, rubs, or gallops, Woody edema bilaterally to the thighs Respiratory:  clear to  auscultation bilaterally, normal work of breathing GI: soft, nontender, nondistended, + BS MS: no deformity or atrophy Skin: warm and dry, no rash Neuro:  Strength and sensation are intact Psych: euthymic mood, full affect  Recent Labs: 02/14/2022: B Natriuretic Peptide 29.1; Hemoglobin 14.5; Platelets 251; TSH 2.389 05/15/2022: ALT 32 09/05/2022: BUN 18; Creatinine, Ser 0.95; Potassium 3.9; Sodium 139    Lipid Panel Lab Results  Component Value Date   CHOL 93 05/15/2022   HDL 32 (L) 05/15/2022   LDLCALC 47 05/15/2022   TRIG 69 05/15/2022      Wt Readings from Last 3 Encounters:  09/30/22 (!) 341 lb (154.7 kg)  09/05/22 (!) 340 lb 12.8 oz (154.6 kg)  07/10/22 (!) 338 lb 8 oz (153.5 kg)     ASSESSMENT AND PLAN:  Problem List Items Addressed This Visit       Cardiology Problems   Chronic heart failure with preserved ejection fraction (HFpEF) (  HCC) - Primary   Relevant Orders   EKG 12-Lead (Completed)   Coronary artery disease involving native coronary artery of native heart without angina pectoris   Relevant Orders   EKG 12-Lead (Completed)   Essential hypertension   Relevant Orders   EKG 12-Lead (Completed)    Leg edema/lymphedema On Lasix 40 twice daily with Jardiance Continued symptoms, uses TED hose Edema is woody, chronic, debilitating, unable to ambulate without a walker Recommend he consider evaluation by vein and vascular for lymphedema, consideration of lymphedema compression pumps  Coronary disease with stable angina Currently with no symptoms of angina. No further workup at this time. Continue current medication regimen. Cholesterol at goal  Hyperlipidemia Continue Crestor 40, numbers at goal  Morbid obesity Difficulty exercising secondary to leg debility knees and lymphedema/leg swelling Recommend calorie restriction  Chronic diastolic CHF Prior echocardiogram with mild to moderately elevated right heart pressures Symptoms possibly exacerbated by  sleep apnea, he has sleep study tonight Stable on Lasix 40 twice daily with Jardiance   Total encounter time more than 40 minutes Greater than 50% was spent in counseling and coordination of care with the patient    Signed, Dossie Arbour, M.D., Ph.D. [facility] [place], [place] [phone]

## 2022-09-30 ENCOUNTER — Encounter: Payer: Self-pay | Admitting: Cardiovascular Disease

## 2022-09-30 ENCOUNTER — Ambulatory Visit: Payer: Medicaid Other | Attending: Otolaryngology

## 2022-09-30 ENCOUNTER — Ambulatory Visit: Payer: Medicaid Other | Attending: Cardiovascular Disease | Admitting: Cardiovascular Disease

## 2022-09-30 VITALS — BP 130/50 | Ht 68.0 in | Wt 341.0 lb

## 2022-09-30 DIAGNOSIS — I10 Essential (primary) hypertension: Secondary | ICD-10-CM | POA: Diagnosis not present

## 2022-09-30 DIAGNOSIS — I2510 Atherosclerotic heart disease of native coronary artery without angina pectoris: Secondary | ICD-10-CM | POA: Diagnosis not present

## 2022-09-30 DIAGNOSIS — I5032 Chronic diastolic (congestive) heart failure: Secondary | ICD-10-CM

## 2022-09-30 DIAGNOSIS — G4733 Obstructive sleep apnea (adult) (pediatric): Secondary | ICD-10-CM | POA: Insufficient documentation

## 2022-09-30 DIAGNOSIS — R600 Localized edema: Secondary | ICD-10-CM | POA: Diagnosis not present

## 2022-09-30 DIAGNOSIS — I890 Lymphedema, not elsewhere classified: Secondary | ICD-10-CM

## 2022-09-30 NOTE — Patient Instructions (Addendum)
Referral to vascular for lymphedema compression pumps   Medication Instructions:  No changes  If you need a refill on your cardiac medications before your next appointment, please call your pharmacy.   Lab work: No new labs needed  Testing/Procedures: No new testing needed  Follow-Up: At [facility], you and your health needs are our priority.  As part of our continuing mission to provide you with exceptional heart care, we have created designated Provider Care Teams.  These Care Teams include your primary Cardiologist (physician) and Advanced Practice Providers (APPs -  Physician Assistants and Nurse Practitioners) who all work together to provide you with the care you need, when you need it.  You will need a follow up appointment in 6 months with Dr. Okey Dupre  Providers on your designated Care Team:   Nicolasa Ducking, NP Eula Listen, PA-C Cadence Fransico Michael, [place]  COVID-19 Vaccine Information can be found at: [email or web address] For questions related to vaccine distribution or appointments, please email [email or web address] or call [phone].

## 2022-10-03 ENCOUNTER — Telehealth (HOSPITAL_BASED_OUTPATIENT_CLINIC_OR_DEPARTMENT_OTHER): Payer: Medicaid Other

## 2022-10-03 DIAGNOSIS — G4733 Obstructive sleep apnea (adult) (pediatric): Secondary | ICD-10-CM | POA: Diagnosis not present

## 2022-10-03 NOTE — Telephone Encounter (Signed)
Synetta Fail, can you assist with this? Can we track down study prior to appt tomorrow?

## 2022-10-03 NOTE — Telephone Encounter (Signed)
Lm for patient to ask if he has had split night sleep study.

## 2022-10-03 NOTE — Telephone Encounter (Signed)
Pt. Called back said he had it done on Monday of this week at [place] location

## 2022-10-03 NOTE — Telephone Encounter (Signed)
It has been completed and Dr. Vassie Loll has read the study. They are suppose to be faxing me a copy of the sleep study

## 2022-10-03 NOTE — Telephone Encounter (Signed)
Noted. Thanks so much 

## 2022-10-04 ENCOUNTER — Encounter: Payer: Self-pay | Admitting: Internal Medicine

## 2022-10-04 ENCOUNTER — Ambulatory Visit: Payer: Medicaid Other | Admitting: Internal Medicine

## 2022-10-04 VITALS — BP 128/78 | HR 81 | Temp 98.2°F | Ht 68.0 in | Wt 338.0 lb

## 2022-10-04 DIAGNOSIS — G4733 Obstructive sleep apnea (adult) (pediatric): Secondary | ICD-10-CM | POA: Diagnosis not present

## 2022-10-04 NOTE — Patient Instructions (Addendum)
Start BiPAP IPAP 20 and EPAP 15  Recommend weight loss

## 2022-10-04 NOTE — Progress Notes (Signed)
Name: Jon Garner. MRN: [id number] DOB: 01-05-1966    CHIEF COMPLAINT:  Follow Up Assessment of Sleep Anea   HISTORY OF PRESENT ILLNESS: Follow up OSA assessment Split-night sleep study performed shows severe OSA AHI  57 BiPAP 20/15 seems to have optimized with severe OSA   Discussed sleep data and reviewed with patient.  Encouraged proper weight management.  Discussed driving precautions and its relationship with hypersomnolence.  Discussed operating dangerous equipment and its relationship with hypersomnolence.  Discussed sleep hygiene, and benefits of a fixed sleep waked time.  The importance of getting eight or more hours of sleep discussed with patient.  Discussed limiting the use of the computer and television before bedtime.  Decrease naps during the day, so night time sleep will become enhanced.  Limit caffeine, and sleep deprivation.  HTN, stroke, and heart failure are potential risk factors.    No evidence of heart failure at this time No evidence or signs of infection at this time No respiratory distress No fevers, chills, nausea, vomiting, diarrhea No evidence of lower extremity edema No evidence hemoptysis  PAST MEDICAL HISTORY :   has a past medical history of Anemia, Arthritis, CAD (coronary artery disease), Essential hypertension (2014), GERD (gastroesophageal reflux disease) (2015), History of kidney stones, Morbid obesity (HCC) (presently), Myocardial infarction (HCC) (2019), PXE (pseudoxanthoma elasticum) (1992), Snores (presently), and Stroke (HCC) (1992).  has a past surgical history that includes Knee surgery (Bilateral); LEFT HEART CATH AND CORONARY ANGIOGRAPHY (N/A, 05/26/2017); Coronary artery bypass graft (N/A, 05/29/2017); TEE without cardioversion (N/A, 05/29/2017); Tonsillectomy; Total knee arthroplasty (Right, 08/12/2019); and Total knee arthroplasty (Left, 12/07/2019). Prior to Admission medications   Medication Sig Start Date End Date Taking?  Authorizing Provider  acetaminophen (TYLENOL) 500 MG tablet Take 1,000 mg by mouth every 6 (six) hours as needed for moderate pain or headache.    [provider]  albuterol (VENTOLIN HFA) 108 (90 Base) MCG/ACT inhaler Inhale 2 puffs into the lungs every 6 (six) hours as needed for wheezing or shortness of breath. Patient not taking: Reported on 07/10/2022 02/20/22   Debera Lat, PA-C  aspirin EC 81 MG tablet Take 81 mg by mouth daily. Swallow whole.    [provider]  empagliflozin (JARDIANCE) 10 MG TABS tablet Take 1 tablet (10 mg total) by mouth daily before breakfast. Lot # [id number] Exp. 1/26 07/10/22   End, Cristal Deer, MD  furosemide (LASIX) 40 MG tablet Take 1 tablet (40 mg total) by mouth 2 (two) times daily. 07/10/22   End, Cristal Deer, MD  meloxicam (MOBIC) 7.5 MG tablet Take 7.5 mg by mouth daily. 08/21/22   [provider]  metoprolol tartrate (LOPRESSOR) 25 MG tablet TAKE 1 TABLET(25 MG) BY MOUTH TWICE DAILY 02/15/22   End, Cristal Deer, MD  potassium chloride (KLOR-CON) 10 MEQ tablet TAKE 1 TABLET(10 MEQ) BY MOUTH DAILY 02/15/22   End, Cristal Deer, MD  rosuvastatin (CRESTOR) 40 MG tablet Take 1 tablet (40 mg total) by mouth daily. 02/19/22 02/14/23  End, Cristal Deer, MD  vitamin C (ASCORBIC ACID) 500 MG tablet Take 500 mg by mouth 4 (four) times a week.     [provider]  vitamin E 1000 UNIT capsule Take 1,000 Units by mouth 4 (four) times a week.    [provider]   Allergies  Allergen Reactions   Penicillins Other (See Comments)    Family has had severe reactions to penicillins       FAMILY HISTORY:  family history includes COPD in his  father and sister; Cancer in his sister; Dementia in his mother; Multiple sclerosis in his sister; Other in his father. SOCIAL HISTORY:  reports that he quit smoking about 8 years ago. His smoking use included cigarettes. He started smoking about 18 years ago. He has a 15 pack-year smoking history. He has  never used smokeless tobacco. He reports that he does not currently use alcohol. He reports that he does not use drugs.  BP 128/78 (BP Location: Right Arm, Cuff Size: Large)   Pulse 81   Temp 98.2 F (36.8 C) (Oral)   Ht 5\' 8"  (1.727 m)   Wt (!) 338 lb (153.3 kg)   SpO2 96%   BMI 51.39 kg/m     Review of Systems: Gen:  Denies  fever, sweats, chills weight loss  HEENT: Denies blurred vision, double vision, ear pain, eye pain, hearing loss, nose bleeds, sore throat Cardiac:  No dizziness, chest pain or heaviness, chest tightness,edema, No JVD Resp:   No cough, -sputum production, -shortness of breath,-wheezing, -hemoptysis,  Other:  All other systems negative   Physical Examination:   General Appearance: No distress  EYES PERRLA, EOM intact.   NECK Supple, No JVD Pulmonary: normal breath sounds, No wheezing.  CardiovascularNormal S1,S2.  No m/r/g.   Abdomen: Benign, Soft, non-tender. Neurology UE/LE 5/5 strength, no focal deficits Ext pulses intact, cap refill intact ALL OTHER ROS ARE NEGATIVE    ASSESSMENT AND PLAN SYNOPSIS 57 year old morbidly obese white male with  diagnosis of severe OSA AHI 57  in the setting of obesity and deconditioned state  OSA assessment Start BiPAP 20/15   Obesity -recommend significant weight loss -recommend changing diet  Deconditioned state -Recommend increased daily activity and exercise   MEDICATION ADJUSTMENTS/LABS AND TESTS ORDERED: Start BiPAP therapy as per split night study Recommend Weight loss   CURRENT MEDICATIONS REVIEWED AT LENGTH WITH PATIENT TODAY   Patient  satisfied with Plan of action and management. All questions answered  Follow up 3 months  Total Time Spent  25 mins   Wallis Bamberg Santiago Glad, M.D.  [facility]  Medical Director [facility] Medical Director [facility] Cardio-Pulmonary Department

## 2022-10-08 NOTE — Telephone Encounter (Signed)
Results discuss during visit. Bipap ordered. Nothing further needed.

## 2022-10-08 NOTE — Telephone Encounter (Signed)
Seems like you already know this : Severe OSA AHI 57/h Corrected by BiPAP 20/15  - autoCPAP 12-20 may be ok  Ideally bipap or autobilevel EPAP min 10, PS +5, IPAP max 20

## 2022-10-14 ENCOUNTER — Encounter: Payer: Self-pay | Admitting: Urology

## 2022-10-14 ENCOUNTER — Ambulatory Visit (INDEPENDENT_AMBULATORY_CARE_PROVIDER_SITE_OTHER): Payer: Medicaid Other | Admitting: Urology

## 2022-10-14 VITALS — BP 184/89 | HR 96 | Ht 68.0 in | Wt 330.0 lb

## 2022-10-14 DIAGNOSIS — N5089 Other specified disorders of the male genital organs: Secondary | ICD-10-CM

## 2022-10-14 DIAGNOSIS — N481 Balanitis: Secondary | ICD-10-CM

## 2022-10-14 DIAGNOSIS — N4883 Acquired buried penis: Secondary | ICD-10-CM | POA: Diagnosis not present

## 2022-10-14 MED ORDER — CLOTRIMAZOLE-BETAMETHASONE 1-0.05 % EX CREA: 1.0000 | TOPICAL_CREAM | Freq: Two times a day (BID) | CUTANEOUS | 0 refills | Status: DC

## 2022-10-14 NOTE — Patient Instructions (Signed)
The Benefits of a Plant-Based Diet for Urology Health  A plant-based diet emphasizes the consumption of whole, unprocessed plant foods while minimizing or excluding animal products including meat and dairy products. This dietary approach has gained attention for its potential to promote overall health, including urology-related conditions. Incorporating a plant-based diet into your lifestyle can offer numerous benefits for maintaining optimal urology health.  1. Reduced Risk of Kidney Stones: A plant-based diet is typically rich in fruits, vegetables, legumes, and whole grains. These foods are high in dietary fiber, potassium, and magnesium, which can help reduce the risk of developing kidney stones. Be careful to avoid high quantities of spinach, as these can contribute to kidney Glastetter formation if eaten in large volumes. The increased intake of water-soluble fiber can enhance the excretion of waste products and prevent the crystallization of minerals that lead to Villari formation.  2. Improved Prostate Health: Studies have suggested a link between the consumption of red and processed meats and an increased risk of prostate problems, including benign prostatic hyperplasia (BPH) and prostate cancer. By adopting a plant-based diet, you can lower your intake of saturated fats and decrease the risk of these conditions. PSA levels can often decrease on plant based diets! Plant foods are also rich in antioxidants and phytochemicals that have been associated with prostate health.  3. Better Bladder Function: A diet focused on plant-based foods can contribute to better bladder health by reducing the risk of urinary tract infections (UTIs). Berries, citrus fruits, and leafy greens are known for their high vitamin C content, which can acidify urine and create an environment less favorable for bacteria growth. Additionally, plant-based diets are generally lower in sodium, which can help prevent fluid retention and  reduce the strain on the bladder.  4. Management of Erectile Dysfunction (ED): Some research suggests that a plant-based diet can positively impact erectile function. Plant-based diets are associated with improved cardiovascular health, which is crucial for maintaining healthy blood flow and nerve function required for proper erectile function. By reducing the consumption of high-cholesterol and high-saturated fat animal products, a plant-based diet may contribute to a decreased risk of ED.  5. Prevention of Chronic Conditions: A plant-based diet can help prevent or manage chronic conditions such as obesity, diabetes, and hypertension. These conditions can contribute to urology-related issues, including urinary incontinence and kidney dysfunction. By maintaining a healthy weight and managing these conditions, you can reduce the risk of urology-related complications.  Conclusion: Embracing a plant-based diet can offer significant benefits for urology health. By incorporating a variety of colorful fruits, vegetables, whole grains, nuts, seeds, and legumes into your meals, you can support kidney health, prostate health, bladder function, and overall well-being. Remember to consult with a healthcare professional or registered dietitian before making any significant dietary changes, especially if you have existing health conditions. Your personalized approach to a plant-based diet can contribute to improved urology health and enhance your quality of life.      

## 2022-10-14 NOTE — Progress Notes (Signed)
10/14/22 5:10 PM   Jon Chard Jr. 04-01-1965 [id number]  CC: Buried penis, scrotal swelling  HPI: 57 year old male with morbid obesity and BMI of 50 who presents with buried penis.  He has noticed this for at least a few months that he is unable to identify the glans.  He is circumcised.  He denies any significant urinary symptoms.  He has gained weight recently.  He denies any gross hematuria.   PMH: Past Medical History:  Diagnosis Date   Anemia    h/o age 5   Arthritis    CAD (coronary artery disease)    a. LHC 4/19: pLAD 95%, mLAD 95%, m/dLAD 90%, ostD2 95%, pLCx 80%, mRCA 70%, dRCA 80%; b. 4-V CABG 05/2017: LIMA-LAD, VG-D1, VG-PDA, VG-OM2   Essential hypertension 2014   GERD (gastroesophageal reflux disease) 2015   h/o   History of kidney stones    h/o   Morbid obesity (HCC) presently   Myocardial infarction (HCC) 2019   quadruple bypass   PXE (pseudoxanthoma elasticum) 1992   Snores presently   Stroke [facility]) 1992   a. Age 41    Surgical History: Past Surgical History:  Procedure Laterality Date   CORONARY ARTERY BYPASS GRAFT N/A 05/29/2017   Procedure: CORONARY ARTERY BYPASS GRAFTING (CABG)  X 4 WITH OPEN HARVESTING OF LEFT AND RIGHT SAPHENOUS VEINS;  Surgeon: Loreli Slot, MD;  Location: [facility] OR;  Service: Open Heart Surgery;  Laterality: N/A;   KNEE SURGERY Bilateral    LEFT HEART CATH AND CORONARY ANGIOGRAPHY N/A 05/26/2017   Procedure: LEFT HEART CATH AND CORONARY ANGIOGRAPHY;  Surgeon: Antonieta Iba, MD;  Location: [facility] INVASIVE CV LAB;  Service: Cardiovascular;  Laterality: N/A;   TEE WITHOUT CARDIOVERSION N/A 05/29/2017   Procedure: TRANSESOPHAGEAL ECHOCARDIOGRAM (TEE);  Surgeon: Loreli Slot, MD;  [facility];  Service: Open Heart Surgery;  Laterality: N/A;   TONSILLECTOMY     age 72   TOTAL KNEE ARTHROPLASTY Right 08/12/2019   Procedure: TOTAL KNEE ARTHROPLASTY;  Surgeon: Juanell Fairly, MD;  Location: [facility] ORS;  Service:  Orthopedics;  Laterality: Right;   TOTAL KNEE ARTHROPLASTY Left 12/07/2019   Procedure: LEFT TOTAL KNEE ARTHROPLASTY;  Surgeon: Juanell Fairly, MD;  Location: [facility] ORS;  Service: Orthopedics;  Laterality: Left;     Family History: Family History  Problem Relation Age of Onset   Dementia Mother    Other Father        died in his 66's   COPD Father    Multiple sclerosis Sister    COPD Sister    Cancer Sister     Social History:  reports that he quit smoking about 8 years ago. His smoking use included cigarettes. He started smoking about 18 years ago. He has a 15 pack-year smoking history. He has never used smokeless tobacco. He reports that he does not currently use alcohol. He reports that he does not use drugs.  Physical Exam: BP (!) 184/89 (BP Location: Left Arm, Patient Position: Standing, Cuff Size: Large)   Pulse 96   Ht 5\' 8"  (1.727 m)   Wt (!) 330 lb (149.7 kg)   BMI 50.18 kg/m    Constitutional:  Alert and oriented, No acute distress. Cardiovascular: No clubbing, cyanosis, or edema. Respiratory: Normal respiratory effort, no increased work of breathing. GI: Abdomen is soft, nontender, nondistended, no abdominal masses GU: Buried penis, unable to completely visualize glans, moderate scrotal swelling bilaterally, firmer on the left side  Assessment & Plan:  57 year old male with morbid obesity and BMI of 50 and buried penis.  We discussed this is primarily related to his weight gain.  On exam difficult to visualize the glans but there does appear to be at least some irritation and I think it is reasonable to try a course of Lotrisone cream.  I also recommended a scrotal ultrasound for further evaluation of his moderate scrotal swelling, especially on that left side.  Contact with scrotal ultrasound results Trial of Lotrisone cream Referral placed to plastic surgery to consider surgical options for buried penis repair   Legrand Rams, MD 10/14/2022  [facility]  Urology [address] [place], [place] [address] [phone]

## 2022-10-28 ENCOUNTER — Encounter: Payer: Self-pay | Admitting: Physician Assistant

## 2022-10-28 ENCOUNTER — Telehealth: Payer: Self-pay | Admitting: Physician Assistant

## 2022-10-28 NOTE — Telephone Encounter (Signed)
Pt called requesting a refill on his lisinopril, wants to know if he has been taken off of this medication. Please advise  Medication Refill - Medication: lisinopril (PRINIVIL,ZESTRIL) 20 MG tablet   Has the patient contacted their pharmacy? Yes.   (Agent: If no, request that the patient contact the pharmacy for the refill. If patient does not wish to contact the pharmacy document the reason why and proceed with request.) (Agent: If yes, when and what did the pharmacy advise?)  Preferred Pharmacy (with phone number or street name):  [id number] [place], [place] - [address] AT [facility] [address]  [address] [place] [address]  Phone: [phone] Fax: [phone]   Has the patient been seen for an appointment in the last year OR does the patient have an upcoming appointment? Yes.    Agent: Please be advised that RX refills may take up to 3 business days. We ask that you follow-up with your pharmacy.

## 2022-10-29 DIAGNOSIS — G4733 Obstructive sleep apnea (adult) (pediatric): Secondary | ICD-10-CM | POA: Diagnosis not present

## 2022-10-31 ENCOUNTER — Telehealth: Payer: Self-pay | Admitting: Internal Medicine

## 2022-10-31 NOTE — Telephone Encounter (Signed)
Needs to be rescheduled w/ Kasa or Clent Ridges between dates 11/29/22 and 01/24/23 for cpap compliance.

## 2022-11-01 ENCOUNTER — Other Ambulatory Visit (INDEPENDENT_AMBULATORY_CARE_PROVIDER_SITE_OTHER): Payer: Self-pay | Admitting: Nurse Practitioner

## 2022-11-01 DIAGNOSIS — R600 Localized edema: Secondary | ICD-10-CM

## 2022-11-08 DIAGNOSIS — R2689 Other abnormalities of gait and mobility: Secondary | ICD-10-CM | POA: Diagnosis not present

## 2022-11-08 DIAGNOSIS — S76211D Strain of adductor muscle, fascia and tendon of right thigh, subsequent encounter: Secondary | ICD-10-CM | POA: Diagnosis not present

## 2022-11-11 ENCOUNTER — Encounter (INDEPENDENT_AMBULATORY_CARE_PROVIDER_SITE_OTHER): Payer: Self-pay | Admitting: Nurse Practitioner

## 2022-11-11 ENCOUNTER — Ambulatory Visit (INDEPENDENT_AMBULATORY_CARE_PROVIDER_SITE_OTHER): Payer: Medicaid Other | Admitting: Nurse Practitioner

## 2022-11-11 ENCOUNTER — Ambulatory Visit (INDEPENDENT_AMBULATORY_CARE_PROVIDER_SITE_OTHER): Payer: Medicaid Other

## 2022-11-11 VITALS — BP 168/84 | HR 77 | Resp 18 | Ht 68.0 in | Wt 330.2 lb

## 2022-11-11 DIAGNOSIS — I890 Lymphedema, not elsewhere classified: Secondary | ICD-10-CM | POA: Diagnosis not present

## 2022-11-11 DIAGNOSIS — E785 Hyperlipidemia, unspecified: Secondary | ICD-10-CM | POA: Diagnosis not present

## 2022-11-11 DIAGNOSIS — R600 Localized edema: Secondary | ICD-10-CM

## 2022-11-11 DIAGNOSIS — H353221 Exudative age-related macular degeneration, left eye, with active choroidal neovascularization: Secondary | ICD-10-CM | POA: Diagnosis not present

## 2022-11-11 DIAGNOSIS — I10 Essential (primary) hypertension: Secondary | ICD-10-CM

## 2022-11-11 NOTE — Progress Notes (Signed)
Subjective:    Patient ID: Jon Garner., male    DOB: [birth year], 57 y.o.   MRN: [id number] Chief Complaint  Patient presents with   New Patient (Initial Visit)    Referral to vascular for lymphedema compression pumps    The patient is a 57 year old male who presents today for evaluation of lower extremity edema.  He notes that he has had leg swelling bilaterally for several years.  He notes that the swelling largely began following his CABG several years ago.  During that time he also had his great saphenous vein removed bilaterally for use in the CABG.  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  This has affected his vision as well as a cerebral vessels.  He notes that with his lower extremities he has had a wounds bilaterally with occasional weeping.  He has also had blisters and needed assistance from the wound care facility on multiple occasions.  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  He has also had a bilateral total knee replacement as well.  The patient has no had any past angiography, interventions or vascular surgery.  The patient denies a history of DVT or PE. There is no prior history of phlebitis. There is no history of primary lymphedema.  There is no history of radiation treatment to the groin or pelvis No history of malignancies. No history of trauma or groin or pelvic surgery. No history of foreign travel or parasitic infections area      Review of Systems  Eyes:  Positive for visual disturbance.  Cardiovascular:  Positive for leg  swelling.  Musculoskeletal:  Positive for gait problem.  All other systems reviewed and are negative.      Objective:   Physical Exam Vitals reviewed.  HENT:     Head: Normocephalic.  Cardiovascular:     Rate and Rhythm: Normal rate.  Pulmonary:     Effort: Pulmonary effort is normal.  Musculoskeletal:     Right lower leg: 2+ Edema present.     Left lower leg: 2+ Edema present.  Skin:    General: Skin is warm and dry.  Neurological:     Mental Status: He is alert and oriented to person, place, and time.  Psychiatric:        Mood and Affect: Mood normal.        Behavior: Behavior normal.        Thought Content: Thought content normal.        Judgment: Judgment normal.     BP (!) 168/84 (BP Location: Left Arm)   Pulse 77   Resp 18   Ht 5\' 8"  (1.727 m)   Wt (!) 330 lb 3.2 oz (149.8 kg)   BMI 50.21 kg/m   Past Medical History:  Diagnosis Date   Anemia    h/o age 24   Arthritis    CAD (coronary artery disease)    a. LHC 4/19: pLAD 95%, mLAD 95%, m/dLAD 90%, ostD2 95%, pLCx 80%, mRCA 70%, dRCA 80%; b. 4-V CABG 05/2017: LIMA-LAD, VG-D1, VG-PDA, VG-OM2   Essential hypertension 2014   GERD (gastroesophageal reflux disease)  2015   h/o   History of kidney stones    h/o   Morbid obesity (HCC) presently   Myocardial infarction (HCC) 2019   quadruple bypass   PXE (pseudoxanthoma elasticum) 1992   Snores presently   Stroke [facility]) 1992   a. Age 57    Social History   Socioeconomic History   Marital status: Single    Spouse name: Not on file   Number of children: Not on file   Years of education: Not on file   Highest education level: Not on file  Occupational History   Occupation: Chef  Tobacco Use   Smoking status: Former    Current packs/day: 0.00    Average packs/day: 1.5 packs/day for 10.0 years (15.0 ttl pk-yrs)    Types: Cigarettes    Start date: 12/06/2003    Quit date: 12/05/2013    Years since quitting: 8.9   Smokeless tobacco: Never  Vaping Use    Vaping status: Never Used  Substance and Sexual Activity   Alcohol use: Not Currently   Drug use: Never   Sexual activity: Not on file    Comment: Not asked  Other Topics Concern   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  Does not routinely exercise.   Social Determinants of Health   Financial Resource Strain: Not on file  Food Insecurity: Not on file  Transportation Needs: Not on file  Physical Activity: Unknown (05/27/2017)   Exercise Vital Sign    Days of Exercise per Week: 0 days    Minutes of Exercise per Session: Not on file  Stress: No Stress Concern Present (05/27/2017)   [employer] of Occupational Health - Occupational Stress Questionnaire    Feeling of Stress : Not at all  Social Connections: Not on file  Intimate Partner Violence: Not on file    Past Surgical History:  Procedure Laterality Date   CORONARY ARTERY BYPASS GRAFT N/A 05/29/2017   Procedure: CORONARY ARTERY BYPASS GRAFTING (CABG)  X 4 WITH OPEN HARVESTING OF LEFT AND RIGHT SAPHENOUS VEINS;  Surgeon: Loreli Slot, MD;  Location: [facility] OR;  Service: Open Heart Surgery;  Laterality: N/A;   KNEE SURGERY Bilateral    LEFT HEART CATH AND CORONARY ANGIOGRAPHY N/A 05/26/2017   Procedure: LEFT HEART CATH AND CORONARY ANGIOGRAPHY;  Surgeon: Antonieta Iba, MD;  Location: [facility] INVASIVE CV LAB;  Service: Cardiovascular;  Laterality: N/A;   TEE WITHOUT CARDIOVERSION N/A 05/29/2017   Procedure: TRANSESOPHAGEAL ECHOCARDIOGRAM (TEE);  Surgeon: Loreli Slot, MD;  Location: [facility] OR;  Service: Open Heart Surgery;  Laterality: N/A;   TONSILLECTOMY     age 8   TOTAL KNEE ARTHROPLASTY Right 08/12/2019   Procedure: TOTAL KNEE ARTHROPLASTY;  Surgeon: Juanell Fairly, MD;  Location: [facility] ORS;  Service: Orthopedics;  Laterality: Right;   TOTAL KNEE ARTHROPLASTY Left 12/07/2019   Procedure: LEFT TOTAL KNEE ARTHROPLASTY;  Surgeon: Juanell Fairly, MD;  Location: [facility] ORS;  Service:  Orthopedics;  Laterality: Left;    Family History  Problem Relation Age of Onset   Dementia Mother    Other Father        died in his 23's   COPD Father    Multiple sclerosis Sister    COPD Sister    Cancer Sister     Allergies  Allergen Reactions   Penicillins Other (See Comments)    Family has had severe reactions to penicillins  Latest Ref Rng & Units 02/14/2022   10:57 AM 12/09/2019    3:16 AM 12/08/2019    3:48 AM  CBC  WBC 4.0 - 10.5 K/uL 7.3  10.6  7.0   Hemoglobin 13.0 - 17.0 g/dL 81.1  9.1  9.3   Hematocrit 39.0 - 52.0 % 43.7  28.0  29.6   Platelets 150 - 400 K/uL 251  231  181       CMP     Component Value Date/Time   NA 139 09/05/2022 1212   NA 138 03/17/2019 0938   K 3.9 09/05/2022 1212   CL 106 09/05/2022 1212   CO2 22 09/05/2022 1212   GLUCOSE 101 (H) 09/05/2022 1212   BUN 18 09/05/2022 1212   BUN 16 03/17/2019 0938   CREATININE 0.95 09/05/2022 1212   CALCIUM 8.7 (L) 09/05/2022 1212   PROT 7.6 05/15/2022 0908   PROT 6.7 02/24/2019 1217   ALBUMIN 3.9 05/15/2022 0908   ALBUMIN 4.2 02/24/2019 1217   AST 37 05/15/2022 0908   ALT 32 05/15/2022 0908   ALKPHOS 79 05/15/2022 0908   BILITOT 0.7 05/15/2022 0908   BILITOT 0.5 02/24/2019 1217   GFRNONAA >60 09/05/2022 1212     No results found.     Assessment & Plan:   1. Lymphedema Recommend:  No surgery or intervention at this point in time.   The Patient is CEAP [id number].  The patient has been wearing compression for more than 12 weeks with no or little benefit.  The patient has been exercising daily for more than 12 weeks. The patient has been elevating and taking OTC pain medications for more than 12 weeks.  None of these have have eliminated the pain related to the lymphedema or the discomfort regarding excessive swelling and venous congestion.    I have reviewed my discussion with the patient regarding lymphedema and why it  causes symptoms.  Patient will continue wearing  graduated compression on a daily basis. The patient should put the compression on first thing in the morning and removing them in the evening. The patient should not sleep in the compression.   In addition, behavioral modification throughout the day will be continued.  This will include frequent elevation (such as in a recliner), use of over the counter pain medications as needed and exercise such as walking.  The systemic causes for chronic edema such as liver, kidney and cardiac etiologies do not appear to have significant changed over the past year.    The patient has chronic , severe lymphedema with hyperpigmentation of the skin and has done MLD, skin care, medication, diet, exercise, elevation and compression for 4 weeks with no improvement,  I am recommending a lymphedema pump.  The patient still has stage 3 lymphedema and therefore, I believe that a lymph pump is needed to improve the control of the patient's lymphedema and improve the quality of life.  Additionally, a lymph pump is warranted because it will reduce the risk of cellulitis and ulceration in the future.  Patient should follow-up in six months   2. Hyperlipidemia LDL goal <70 Continue statin as ordered and reviewed, no changes at this time  3. Essential hypertension Continue antihypertensive medications as already ordered, these medications have been reviewed and there are no changes at this time.   Current Outpatient Medications on File Prior to Visit  Medication Sig Dispense Refill   acetaminophen (TYLENOL) 500 MG tablet Take 1,000 mg by mouth every 6 (six) hours as  needed for moderate pain or headache.     albuterol (VENTOLIN HFA) 108 (90 Base) MCG/ACT inhaler Inhale 2 puffs into the lungs every 6 (six) hours as needed for wheezing or shortness of breath. 8 g 2   aspirin EC 81 MG tablet Take 81 mg by mouth daily. Swallow whole.     clotrimazole-betamethasone (LOTRISONE) cream Apply 1 Application topically 2 (two) times  daily. 30 g 0   empagliflozin (JARDIANCE) 10 MG TABS tablet Take 1 tablet (10 mg total) by mouth daily before breakfast. Lot # [id number] Exp. 1/26 90 tablet 3   furosemide (LASIX) 40 MG tablet Take 1 tablet (40 mg total) by mouth 2 (two) times daily. 180 tablet 3   metoprolol tartrate (LOPRESSOR) 25 MG tablet TAKE 1 TABLET(25 MG) BY MOUTH TWICE DAILY 180 tablet 2   potassium chloride (KLOR-CON) 10 MEQ tablet TAKE 1 TABLET(10 MEQ) BY MOUTH DAILY 90 tablet 2   rosuvastatin (CRESTOR) 40 MG tablet Take 1 tablet (40 mg total) by mouth daily. 90 tablet 3   vitamin C (ASCORBIC ACID) 500 MG tablet Take 500 mg by mouth 4 (four) times a week.      vitamin E 1000 UNIT capsule Take 1,000 Units by mouth 4 (four) times a week.     No current facility-administered medications on file prior to visit.    There are no Patient Instructions on file for this visit. No follow-ups on file.   Georgiana Spinner, NP

## 2022-11-13 DIAGNOSIS — H353221 Exudative age-related macular degeneration, left eye, with active choroidal neovascularization: Secondary | ICD-10-CM | POA: Diagnosis not present

## 2022-11-13 DIAGNOSIS — Q828 Other specified congenital malformations of skin: Secondary | ICD-10-CM | POA: Diagnosis not present

## 2022-11-13 DIAGNOSIS — H2513 Age-related nuclear cataract, bilateral: Secondary | ICD-10-CM | POA: Diagnosis not present

## 2022-11-15 ENCOUNTER — Ambulatory Visit: Payer: Medicaid Other | Admitting: Primary Care

## 2022-11-26 ENCOUNTER — Ambulatory Visit: Payer: Medicaid Other | Admitting: Physician Assistant

## 2022-11-28 DIAGNOSIS — G4733 Obstructive sleep apnea (adult) (pediatric): Secondary | ICD-10-CM | POA: Diagnosis not present

## 2022-11-29 ENCOUNTER — Ambulatory Visit: Payer: Medicaid Other | Admitting: Physician Assistant

## 2022-12-06 ENCOUNTER — Telehealth (INDEPENDENT_AMBULATORY_CARE_PROVIDER_SITE_OTHER): Payer: Self-pay

## 2022-12-06 NOTE — Telephone Encounter (Signed)
Patient called to check the status of the lymphedema pump. Patient was informed that the process will take a few weeks but if he hasn't heard from the company he can contact the office.

## 2022-12-13 ENCOUNTER — Other Ambulatory Visit: Payer: Self-pay | Admitting: Internal Medicine

## 2022-12-18 ENCOUNTER — Ambulatory Visit (INDEPENDENT_AMBULATORY_CARE_PROVIDER_SITE_OTHER): Payer: Medicaid Other | Admitting: Primary Care

## 2022-12-18 ENCOUNTER — Encounter: Payer: Self-pay | Admitting: Primary Care

## 2022-12-18 VITALS — BP 136/80 | HR 81 | Temp 97.8°F | Ht 68.0 in | Wt 326.8 lb

## 2022-12-18 DIAGNOSIS — Z23 Encounter for immunization: Secondary | ICD-10-CM | POA: Diagnosis not present

## 2022-12-18 DIAGNOSIS — G4733 Obstructive sleep apnea (adult) (pediatric): Secondary | ICD-10-CM

## 2022-12-18 NOTE — Patient Instructions (Signed)
I had Dr. Belia Heman look at your download, he did not feel we needed to adjust your pressure right now He said to keep things they way they are because you are benefiting from therapy and residual apneas are mild/significantly improved from original sleep study (58 times a hour). Well continue to monitor you clinically along with your download. If waking up gasping/choking notify office. Continue to work on weight loss. Avoid sleeping on your back, if you must sleep on your back please try a wedge pillow to elevate your head which will also help reduce apneas  Office treatment: Influenza vaccine given today  Follow-up 6 months with Dr. Belia Heman in [facility]   Sleep Apnea  Sleep apnea is a condition that affects your breathing while you are sleeping. Your tongue or soft tissue in your throat may block the flow of air while you sleep. You may have shallow breathing or stop breathing for short periods of time. People with sleep apnea may snore loudly. There are three kinds of sleep apnea: Obstructive sleep apnea. This kind is caused by a blocked or collapsed airway. This is the most common. Central sleep apnea. This kind happens when the part of the brain that controls breathing does not send the correct signals to the muscles that control breathing. Mixed sleep apnea. This is a combination of obstructive and central sleep apnea. What are the causes? The most common cause of sleep apnea is a collapsed or blocked airway. What increases the risk? Being very overweight. Having family members with sleep apnea. Having a tongue or tonsils that are larger than normal. Having a small airway or jaw problems. Being older. What are the signs or symptoms? Loud snoring. Restless sleep. Trouble staying asleep. Being sleepy or tired during the day. Waking up gasping or choking. Having a headache in the morning. Mood swings. Having a hard time remembering things and concentrating. How is this diagnosed? A  medical history. A physical exam. A sleep study. This is also called a polysomnography test. This test is done at a sleep lab or in your home while you are sleeping. How is this treated? Treatment may include: Sleeping on your side. Losing weight if you're overweight. Wearing an oral appliance. This is a mouthpiece that moves your lower jaw forward. Using a positive airway pressure (PAP) device to keep your airways open while you sleep, such as: A continuous positive airway pressure (CPAP) device. This device gives forced air through a mask when you breathe out. This keeps your airways open. A bilevel positive airway pressure (BIPAP) device. This device gives forced air through a mask when you breathe in and when you breathe out to keep your airways open. Having surgery if other treatments do not work. If your sleep apnea is not treated, you may be at risk for: Heart failure. Heart attack. Stroke. Type 2 diabetes or a problem with your blood sugar called insulin resistance. Follow these instructions at home: Medicines Take your medicines only as told by your health care provider. Avoid alcohol, medicines to help you relax, and certain pain medicines. These may make sleep apnea worse. General instructions Do not smoke, vape, or use products with nicotine or tobacco in them. If you need help quitting, talk with your provider. If you were given a PAP device to open your airway while you sleep, use it as told by your provider. If you're having surgery, make sure to tell your provider you have sleep apnea. You may need to bring your PAP  device with you. Contact a health care provider if: The PAP device that you were given to use during sleep bothers you or does not seem to be working. You do not feel better or you feel worse. Get help right away if: You have trouble breathing. You have chest pain. You have trouble talking. One side of your body feels weak. A part of your face is hanging  down. These symptoms may be an emergency. Call 911 right away. Do not wait to see if the symptoms will go away. Do not drive yourself to the hospital. This information is not intended to replace advice given to you by your health care provider. Make sure you discuss any questions you have with your health care provider. Document Revised: 03/28/2022 Document Reviewed: 03/28/2022 Elsevier Patient Education  2024 [employer].

## 2022-12-18 NOTE — Progress Notes (Signed)
@Patient  ID: Jon Garner., male    DOB: 09-24-1965, 57 y.o.   MRN: [id number]  Chief Complaint  Patient presents with   Follow-up    Wearing BiPAP nightly, 6-10 hours. No problems with mask or pressure.     Referring provider: Cherlynn Polo  HPI: 57 year old male, former smoker quit in 2015.  Past medical history significant for hypertension, chronic heart failure with preserved ejection fraction, hyperlipidemia, NSTEMI, CABG x 4, bilateral knee replacements, obesity.  Split-night sleep study performed shows severe OSA AHI  57 BiPAP 20/15 seems to have optimized with severe OSA  12/18/2022- Interim hx Discussed the use of AI scribe software for clinical note transcription with the patient, who gave verbal consent to proceed.  History of Present Illness   The patient, with a history of severe sleep apnea, presents for a follow-up visit after initiating BiPAP therapy. He reports consistent use of the BiPAP machine since the day he received it, with an initial struggle due to high leakage caused by his beard. After shaving the beard, the patient noticed a significant improvement in the machine's performance. He reports a few instances of non-compliance due to a cold and nasal congestion, which made breathing difficult.  The patient has noticed a marked improvement in his sleep quality since starting BiPAP therapy. He reports sleeping through the night without waking, a significant change from his previous pattern of waking three to four times a night. He also notes feeling more refreshed upon waking and less need for daytime naps. The patient has been proactive in maintaining his BiPAP machine, replacing the mask as recommended and adjusting the settings for optimal comfort and effectiveness.   [employer] compliance report 11/17/2022 - 12/16/2022 Usage days 30/30 days (100%); 28 days (93%) greater than 4 hours Average usage 6 hours 40 minutes EPAP 15/IPAP 20 Average air  leaks 9.6 L/min AHI 12  Allergies  Allergen Reactions   Penicillins Other (See Comments)    Family has had severe reactions to penicillins       Immunization History  Administered Date(s) Administered   Influenza,inj,Quad PF,6+ Mos 11/05/2017, 12/08/2019   PFIZER(Purple Top)SARS-COV-2 Vaccination 05/10/2019, 06/02/2019, 01/26/2020    Past Medical History:  Diagnosis Date   Anemia    h/o age 49   Arthritis    CAD (coronary artery disease)    a. LHC 4/19: pLAD 95%, mLAD 95%, m/dLAD 90%, ostD2 95%, pLCx 80%, mRCA 70%, dRCA 80%; b. 4-V CABG 05/2017: LIMA-LAD, VG-D1, VG-PDA, VG-OM2   Essential hypertension 2014   GERD (gastroesophageal reflux disease) 2015   h/o   History of kidney stones    h/o   Morbid obesity (HCC) presently   Myocardial infarction (HCC) 2019   quadruple bypass   PXE (pseudoxanthoma elasticum) 1992   Snores presently   Stroke [facility]) 1992   a. Age 3    Tobacco History: Social History   Tobacco Use  Smoking Status Former   Current packs/day: 0.00   Average packs/day: 1.5 packs/day for 10.0 years (15.0 ttl pk-yrs)   Types: Cigarettes   Start date: 12/06/2003   Quit date: 12/05/2013   Years since quitting: 9.0  Smokeless Tobacco Never   Counseling given: Not Answered   Outpatient Medications Prior to Visit  Medication Sig Dispense Refill   acetaminophen (TYLENOL) 500 MG tablet Take 1,000 mg by mouth every 6 (six) hours as needed for moderate pain or headache.     albuterol (VENTOLIN HFA) 108 (90 Base) MCG/ACT inhaler  Inhale 2 puffs into the lungs every 6 (six) hours as needed for wheezing or shortness of breath. 8 g 2   aspirin EC 81 MG tablet Take 81 mg by mouth daily. Swallow whole.     clotrimazole-betamethasone (LOTRISONE) cream Apply 1 Application topically 2 (two) times daily. 30 g 0   empagliflozin (JARDIANCE) 10 MG TABS tablet Take 1 tablet (10 mg total) by mouth daily before breakfast. Lot # [id number] Exp. 1/26 90 tablet 3   furosemide  (LASIX) 40 MG tablet Take 1 tablet (40 mg total) by mouth 2 (two) times daily. 180 tablet 3   metoprolol tartrate (LOPRESSOR) 25 MG tablet TAKE 1 TABLET(25 MG) BY MOUTH TWICE DAILY 180 tablet 2   potassium chloride (KLOR-CON) 10 MEQ tablet TAKE 1 TABLET(10 MEQ) BY MOUTH DAILY 90 tablet 2   rosuvastatin (CRESTOR) 40 MG tablet Take 1 tablet (40 mg total) by mouth daily. 90 tablet 3   vitamin C (ASCORBIC ACID) 500 MG tablet Take 500 mg by mouth 4 (four) times a week.      vitamin E 1000 UNIT capsule Take 1,000 Units by mouth 4 (four) times a week.     No facility-administered medications prior to visit.   Review of Systems  Review of Systems  Constitutional:  Negative for fatigue.  Respiratory: Negative.    Cardiovascular: Negative.   Psychiatric/Behavioral:  Negative for sleep disturbance.     Physical Exam  BP 136/80 (BP Location: Left Arm, Patient Position: Sitting, Cuff Size: Large)   Pulse 81   Temp 97.8 F (36.6 C) (Temporal)   Ht 5\' 8"  (1.727 m)   Wt (!) 326 lb 12.8 oz (148.2 kg)   SpO2 95%   BMI 49.69 kg/m  Physical Exam Constitutional:      Appearance: Normal appearance. He is obese.  HENT:     Head: Normocephalic and atraumatic.  Cardiovascular:     Rate and Rhythm: Normal rate and regular rhythm.  Pulmonary:     Effort: Pulmonary effort is normal.     Breath sounds: Normal breath sounds.  Neurological:     General: No focal deficit present.     Mental Status: He is alert and oriented to person, place, and time. Mental status is at baseline.  Psychiatric:        Mood and Affect: Mood normal.        Behavior: Behavior normal.        Thought Content: Thought content normal.        Judgment: Judgment normal.      Lab Results:  CBC    Component Value Date/Time   WBC 7.3 02/14/2022 1057   RBC 4.94 02/14/2022 1057   HGB 14.5 02/14/2022 1057   HGB 14.5 02/24/2019 1217   HCT 43.7 02/14/2022 1057   HCT 43.0 02/24/2019 1217   PLT 251 02/14/2022 1057   PLT  271 02/24/2019 1217   MCV 88.5 02/14/2022 1057   MCV 88 02/24/2019 1217   MCH 29.4 02/14/2022 1057   MCHC 33.2 02/14/2022 1057   RDW 14.0 02/14/2022 1057   RDW 13.1 02/24/2019 1217   LYMPHSABS 1.5 02/14/2022 1057   LYMPHSABS 1.8 06/19/2017 1039   MONOABS 0.7 02/14/2022 1057   EOSABS 0.2 02/14/2022 1057   EOSABS 0.9 (H) 06/19/2017 1039   BASOSABS 0.0 02/14/2022 1057   BASOSABS 0.0 06/19/2017 1039    BMET    Component Value Date/Time   NA 139 09/05/2022 1212   NA 138 03/17/2019 9562  K 3.9 09/05/2022 1212   CL 106 09/05/2022 1212   CO2 22 09/05/2022 1212   GLUCOSE 101 (H) 09/05/2022 1212   BUN 18 09/05/2022 1212   BUN 16 03/17/2019 0938   CREATININE 0.95 09/05/2022 1212   CALCIUM 8.7 (L) 09/05/2022 1212   GFRNONAA >60 09/05/2022 1212   GFRAA >60 08/13/2019 0637    BNP    Component Value Date/Time   BNP 29.1 02/14/2022 1057    ProBNP No results found for: "PROBNP"  Imaging: No results found.   Assessment & Plan:   1. Need for influenza vaccination - Flu vaccine trivalent PF, 6mos and older(Flulaval,Afluria,Fluarix,Fluzone)  2. Severe obstructive sleep apnea  Severe Sleep Apnea - Significant improvement with BiPAP therapy 20/10cm h20. He is still havings some mild residual apneas 12 events per hour but this has improved from original sleep study (AHI 58). Patient reports better sleep quality and feeling more refreshed. No significant airleaks.I had Dr. Belia Heman look BIPAP compliance download, he did not feel we needed to adjust pressure settings right now seeing as patient is benefiting from therapy and residual apneas are mild and have significantly improved from original sleep study. We'll continue to monitor patient clinically along with compliance downloads. Advised patient if waking up gasping/choking to notify office. Continue to work on weight loss. Avoid sleeping in supine position, if patient must sleep on his back recommend he try a wedge pillow to elevate  your head to help reduce apneas -Encourage continued consistent use of BiPAP. -Follow up in 6 months or sooner if issues arise.  General Health Maintenance -Administer regular dose flu shot today.       Glenford Bayley, NP 12/18/2022

## 2022-12-25 ENCOUNTER — Telehealth: Payer: Medicaid Other | Admitting: Physician Assistant

## 2022-12-25 ENCOUNTER — Ambulatory Visit: Payer: Self-pay | Admitting: *Deleted

## 2022-12-25 ENCOUNTER — Encounter: Payer: Self-pay | Admitting: Physician Assistant

## 2022-12-25 DIAGNOSIS — R059 Cough, unspecified: Secondary | ICD-10-CM | POA: Diagnosis not present

## 2022-12-25 MED ORDER — ALBUTEROL SULFATE HFA 108 (90 BASE) MCG/ACT IN AERS
2.0000 | INHALATION_SPRAY | Freq: Four times a day (QID) | RESPIRATORY_TRACT | 2 refills | Status: AC | PRN
Start: 2022-12-25 — End: ?

## 2022-12-25 NOTE — Telephone Encounter (Signed)
  Chief Complaint: cough, new onset SOB- exertion today Symptoms: cough- productive, SOB with exertion today Frequency: cough has been several days- but SOB started today Pertinent Negatives: Patient denies chest pain, fever Disposition: [] ED /[] Urgent Care (no appt availability in office) / [x] Appointment(In office/virtual)/ []  [facility]/ [] Home Care/ [] Refused Recommended Disposition /[] [place]/ []  Follow-up with PCP Additional Notes: No in office appointment- VV with PCP for assessment.

## 2022-12-25 NOTE — Telephone Encounter (Signed)
Reason for Disposition  [1] MILD difficulty breathing (e.g., minimal/no SOB at rest, SOB with walking, pulse <100) AND [2] NEW-onset or WORSE than normal  Answer Assessment - Initial Assessment Questions 1. RESPIRATORY STATUS: "Describe your breathing?" (e.g., wheezing, shortness of breath, unable to speak, severe coughing)      Cough- productive, SOB with exertion 2. ONSET: "When did this breathing problem begin?"      Today 3. PATTERN "Does the difficult breathing come and go, or has it been constant since it started?"      Comes and goes 4. SEVERITY: "How bad is your breathing?" (e.g., mild, moderate, severe)    - MILD: No SOB at rest, mild SOB with walking, speaks normally in sentences, can lie down, no retractions, pulse < 100.    - MODERATE: SOB at rest, SOB with minimal exertion and prefers to sit, cannot lie down flat, speaks in phrases, mild retractions, audible wheezing, pulse 100-120.    - SEVERE: Very SOB at rest, speaks in single words, struggling to breathe, sitting hunched forward, retractions, pulse > 120      mild 5. RECURRENT SYMPTOM: "Have you had difficulty breathing before?" If Yes, ask: "When was the last time?" and "What happened that time?"      Only last year- RSV 6. CARDIAC HISTORY: "Do you have any history of heart disease?" (e.g., heart attack, angina, bypass surgery, angioplasty)      Heart disease  8. CAUSE: "What do you think is causing the breathing problem?"      Not sure 9. OTHER SYMPTOMS: "Do you have any other symptoms? (e.g., dizziness, runny nose, cough, chest pain, fever)     Cough- chest congestion, nasal congestion  Protocols used: Breathing Difficulty-A-AH

## 2022-12-25 NOTE — Progress Notes (Signed)
MyChart Video Visit  Virtual Visit via Video Note   This format is felt to be most appropriate for this patient at this time. Physical exam was limited by quality of the video and audio technology used for the visit.   Patient location: office Patient Location: Home  I discussed the limitations of evaluation and management by telemedicine and the availability of in person appointments. The patient expressed understanding and agreed to proceed.  Patient: Jon Garner.   DOB: Jul 26, 1965   57 y.o. Male  MRN: [id number] Visit Date: 12/25/2022  Today's healthcare provider: Debera Lat, PA-C   Chief Complaint  Patient presents with   Acute Visit    Cough x 3 weeks    Subjective     Discussed the use of AI scribe software for clinical note transcription with the patient, who gave verbal consent to proceed.  History of Present Illness   The patient, with a history of heart disease and sleep apnea, presents with a three-week history of a severe cold, nasal congestion, and cough. He has been self-treating with Mucinex and other over-the-counter remedies. The patient reports that his symptoms have improved significantly, but today he has noticed an increase in shortness of breath, even when talking. He also reports a persistent cough that has caused rib pain, possibly due to a pulled muscle. The patient also reports excessive sweating, which he has not experienced since his heart attack. The patient's shortness of breath was particularly noticeable today when he had to carry his three-year-old niece up the stairs, after which he struggled to catch his breath. The patient also reports hearing a high-pitched noise, but it is unclear if this is related to his current symptoms. The patient has been using a BiPAP machine for sleep apnea, which he reports has been helpful.         Medications: Outpatient Medications Prior to Visit  Medication Sig   acetaminophen (TYLENOL) 500 MG tablet  Take 1,000 mg by mouth every 6 (six) hours as needed for moderate pain or headache.   aspirin EC 81 MG tablet Take 81 mg by mouth daily. Swallow whole.   clotrimazole-betamethasone (LOTRISONE) cream Apply 1 Application topically 2 (two) times daily.   empagliflozin (JARDIANCE) 10 MG TABS tablet Take 1 tablet (10 mg total) by mouth daily before breakfast. Lot # [id number] Exp. 1/26   furosemide (LASIX) 40 MG tablet Take 1 tablet (40 mg total) by mouth 2 (two) times daily.   metoprolol tartrate (LOPRESSOR) 25 MG tablet TAKE 1 TABLET(25 MG) BY MOUTH TWICE DAILY   potassium chloride (KLOR-CON) 10 MEQ tablet TAKE 1 TABLET(10 MEQ) BY MOUTH DAILY   rosuvastatin (CRESTOR) 40 MG tablet Take 1 tablet (40 mg total) by mouth daily.   vitamin C (ASCORBIC ACID) 500 MG tablet Take 500 mg by mouth 4 (four) times a week.    vitamin E 1000 UNIT capsule Take 1,000 Units by mouth 4 (four) times a week.   [DISCONTINUED] albuterol (VENTOLIN HFA) 108 (90 Base) MCG/ACT inhaler Inhale 2 puffs into the lungs every 6 (six) hours as needed for wheezing or shortness of breath.   No facility-administered medications prior to visit.    Review of Systems  All other systems reviewed and are negative.  Except see HPI       Objective    There were no vitals taken for this visit.      Physical Exam Constitutional:      General: He is not in acute distress.  Appearance: Normal appearance. He is not diaphoretic.  HENT:     Head: Normocephalic.  Eyes:     Conjunctiva/sclera: Conjunctivae normal.  Pulmonary:     Effort: Pulmonary effort is normal. No respiratory distress.  Neurological:     Mental Status: He is alert and oriented to person, place, and time. Mental status is at baseline.        Assessment & Plan       1. Cough, unspecified type - albuterol (VENTOLIN HFA) 108 (90 Base) MCG/ACT inhaler; Inhale 2 puffs into the lungs every 6 (six) hours as needed for wheezing or shortness of breath.  Dispense:  8 g; Refill: 2 In the setting of Respiratory Infection Persistent cough and shortness of breath for three weeks, with increased shortness of breath and wheezing noted today. History of similar symptoms previously improved with albuterol inhaler. No fever reported. -Prescribe albuterol inhaler for use as needed. -Schedule in-person visit for tomorrow to assess for possible pneumonia or bronchitis. Associated: Hyperhidrosis New onset excessive sweating, similar to previous episode during a heart attack. -Plan to further evaluate during in-person visit tomorrow.  Sleep Apnea Currently using BiPAP machine and reports improvement. -Continue current management.   No follow-ups on file.     I discussed the assessment and treatment plan with the patient. The patient was provided an opportunity to ask questions and all were answered. The patient agreed with the plan and demonstrated an understanding of the instructions.   The patient was advised to call back or seek an in-person evaluation if the symptoms worsen or if the condition fails to improve as anticipated.  I provided 11 minutes of non-face-to-face time during this encounter.  I, Debera Lat, PA-C have reviewed all documentation for this visit. The documentation on  12/25/22  for the exam, diagnosis, procedures, and orders are all accurate and complete.  Debera Lat, [facility], MMS [place] [phone] (phone) [phone] (fax)  [facility]

## 2022-12-26 ENCOUNTER — Ambulatory Visit: Payer: Medicaid Other | Admitting: Physician Assistant

## 2022-12-26 ENCOUNTER — Encounter: Payer: Self-pay | Admitting: Physician Assistant

## 2022-12-26 VITALS — BP 170/85 | HR 76 | Ht 63.0 in | Wt 323.6 lb

## 2022-12-26 DIAGNOSIS — R058 Other specified cough: Secondary | ICD-10-CM | POA: Diagnosis not present

## 2022-12-26 DIAGNOSIS — I10 Essential (primary) hypertension: Secondary | ICD-10-CM

## 2022-12-26 MED ORDER — FLUTICASONE PROPIONATE 50 MCG/ACT NA SUSP
2.0000 | Freq: Every day | NASAL | 6 refills | Status: AC
Start: 2022-12-26 — End: ?

## 2022-12-26 MED ORDER — CETIRIZINE HCL 10 MG PO TABS
10.0000 mg | ORAL_TABLET | Freq: Every day | ORAL | 11 refills | Status: AC
Start: 2022-12-26 — End: ?

## 2022-12-26 NOTE — Progress Notes (Signed)
Established patient visit  Patient: Jon Garner.   DOB: Jan 05, 1966   57 y.o. Male  MRN: [id number] Visit Date: 12/26/2022  Today's healthcare provider: Debera Lat, PA-C   Chief Complaint  Patient presents with   Shortness of Breath    Present for a few days and would like to have lungs listened too. Patient reports sweats as well. States nasal congestion is better but still having some coughing when going to bed even with bipap machine and in the mornings   Subjective     Discussed the use of AI scribe software for clinical note transcription with the patient, who gave verbal consent to proceed.  History of Present Illness   The patient, with a history of stroke, heart attack, and quadruple bypass surgery, presents with symptoms of congestion and coughing up milky phlegm. They report that these symptoms have been present for about three weeks. The patient also mentions experiencing high blood pressure, which they monitor at home. They note that their blood pressure was 125 at home, but was reported to be 170 at the clinic. The patient admits to forgetting to take their metoprolol on the day of the visit, which may have contributed to the elevated blood pressure. The patient also uses a BiPAP machine for sleep apnea and reports improvement in their sleep quality since starting its use.           02/20/2022    3:40 PM 02/12/2022    2:42 PM  Depression screen PHQ 2/9  Decreased Interest 0 0  Down, Depressed, Hopeless 0 0  PHQ - 2 Score 0 0  Altered sleeping 0 0  Tired, decreased energy 0 0  Change in appetite 0 0  Feeling bad or failure about yourself  0 0  Trouble concentrating 0 0  Moving slowly or fidgety/restless 0 0  Suicidal thoughts 0 0  PHQ-9 Score 0 0  Difficult doing work/chores Not difficult at all Not difficult at all       No data to display          Medications: Outpatient Medications Prior to Visit  Medication Sig   acetaminophen (TYLENOL) 500 MG  tablet Take 1,000 mg by mouth every 6 (six) hours as needed for moderate pain or headache.   albuterol (VENTOLIN HFA) 108 (90 Base) MCG/ACT inhaler Inhale 2 puffs into the lungs every 6 (six) hours as needed for wheezing or shortness of breath.   aspirin EC 81 MG tablet Take 81 mg by mouth daily. Swallow whole.   clotrimazole-betamethasone (LOTRISONE) cream Apply 1 Application topically 2 (two) times daily.   empagliflozin (JARDIANCE) 10 MG TABS tablet Take 1 tablet (10 mg total) by mouth daily before breakfast. Lot # [id number] Exp. 1/26   furosemide (LASIX) 40 MG tablet Take 1 tablet (40 mg total) by mouth 2 (two) times daily.   metoprolol tartrate (LOPRESSOR) 25 MG tablet TAKE 1 TABLET(25 MG) BY MOUTH TWICE DAILY   potassium chloride (KLOR-CON) 10 MEQ tablet TAKE 1 TABLET(10 MEQ) BY MOUTH DAILY   rosuvastatin (CRESTOR) 40 MG tablet Take 1 tablet (40 mg total) by mouth daily.   vitamin C (ASCORBIC ACID) 500 MG tablet Take 500 mg by mouth 4 (four) times a week.    vitamin E 1000 UNIT capsule Take 1,000 Units by mouth 4 (four) times a week.   No facility-administered medications prior to visit.    Review of Systems  All other systems reviewed and are negative.  Except see HPI  Objective    BP (!) 170/85 (BP Location: Right Arm, Patient Position: Sitting, Cuff Size: Large)   Pulse 76   Ht 5\' 3"  (1.6 m)   Wt (!) 323 lb 9.6 oz (146.8 kg)   SpO2 97%   BMI 57.32 kg/m     Physical Exam Vitals reviewed.  Constitutional:      General: He is not in acute distress.    Appearance: Normal appearance. He is not diaphoretic.  HENT:     Head: Normocephalic and atraumatic.  Eyes:     General: No scleral icterus.    Conjunctiva/sclera: Conjunctivae normal.  Cardiovascular:     Rate and Rhythm: Normal rate and regular rhythm.     Pulses: Normal pulses.     Heart sounds: Normal heart sounds. No murmur heard. Pulmonary:     Effort: Pulmonary effort is normal. No respiratory distress.      Breath sounds: Normal breath sounds. No wheezing or rhonchi.  Musculoskeletal:     Cervical back: Neck supple.     Right lower leg: No edema.     Left lower leg: No edema.  Lymphadenopathy:     Cervical: No cervical adenopathy.  Skin:    General: Skin is warm and dry.     Findings: No rash.  Neurological:     Mental Status: He is alert and oriented to person, place, and time. Mental status is at baseline.  Psychiatric:        Mood and Affect: Mood normal.        Behavior: Behavior normal.      No results found for any visits on 12/26/22.  Assessment & Plan        Upper Respiratory Symptoms Complaints of congestion, post-nasal drip, and cough with milky sputum. No fever, dyspnea, or wheezing noted on exam. Likely sinusitis with post-nasal drip causing cough. -Start Flonase nasal spray for local steroid effect to reduce inflammation. -Consider use of antihistamines for symptomatic relief.  Hypertension Chronic and unstable Elevated blood pressure at visit, patient admits to forgetting to take antihypertensive medications on the day of the visit. -Continue current antihypertensive regimen including Furosemide, Metoprolol,. -Emphasize the importance of daily medication adherence.  Weight Management Patient reports recent weight loss through diet modification and increased physical activity. -Encourage continuation of current weight loss strategies.  Follow-up in 2 weeks to recheck blood pressure and discuss blood panel results.     Return in about 1 year (around 12/26/2023) for BP f/u, chronic disease f/u.     The patient was advised to call back or seek an in-person evaluation if the symptoms worsen or if the condition fails to improve as anticipated.  I discussed the assessment and treatment plan with the patient. The patient was provided an opportunity to ask questions and all were answered. The patient agreed with the plan and demonstrated an understanding of the  instructions.  I, Debera Lat, PA-C have reviewed all documentation for this visit. The documentation on  12/26/22  for the exam, diagnosis, procedures, and orders are all accurate and complete.  Debera Lat, [facility], MMS [facility] [phone] (phone) [phone] (fax)  [facility]

## 2022-12-29 DIAGNOSIS — G4733 Obstructive sleep apnea (adult) (pediatric): Secondary | ICD-10-CM | POA: Diagnosis not present

## 2023-01-22 ENCOUNTER — Telehealth (INDEPENDENT_AMBULATORY_CARE_PROVIDER_SITE_OTHER): Payer: Self-pay

## 2023-01-22 NOTE — Telephone Encounter (Signed)
Patient left a message to have  a lymph pump be ordered. I pulled the encounter form then read the  assessment and plan. Patient's pump has been ordered through [employer] and patient informed.

## 2023-01-28 DIAGNOSIS — G4733 Obstructive sleep apnea (adult) (pediatric): Secondary | ICD-10-CM | POA: Diagnosis not present

## 2023-02-02 DIAGNOSIS — G4733 Obstructive sleep apnea (adult) (pediatric): Secondary | ICD-10-CM | POA: Diagnosis not present

## 2023-02-04 DIAGNOSIS — H353221 Exudative age-related macular degeneration, left eye, with active choroidal neovascularization: Secondary | ICD-10-CM | POA: Diagnosis not present

## 2023-02-04 DIAGNOSIS — H2513 Age-related nuclear cataract, bilateral: Secondary | ICD-10-CM | POA: Diagnosis not present

## 2023-02-28 DIAGNOSIS — G4733 Obstructive sleep apnea (adult) (pediatric): Secondary | ICD-10-CM | POA: Diagnosis not present

## 2023-03-24 ENCOUNTER — Other Ambulatory Visit: Payer: Self-pay | Admitting: Internal Medicine

## 2023-03-24 NOTE — Telephone Encounter (Signed)
 Last office visit: 09/30/22 with plan to f/u in 6 months  Next office visit:none/does have active recall

## 2023-03-25 ENCOUNTER — Other Ambulatory Visit: Payer: Self-pay | Admitting: Internal Medicine

## 2023-03-28 DIAGNOSIS — I890 Lymphedema, not elsewhere classified: Secondary | ICD-10-CM | POA: Diagnosis not present

## 2023-03-31 DIAGNOSIS — G4733 Obstructive sleep apnea (adult) (pediatric): Secondary | ICD-10-CM | POA: Diagnosis not present

## 2023-04-28 DIAGNOSIS — G4733 Obstructive sleep apnea (adult) (pediatric): Secondary | ICD-10-CM | POA: Diagnosis not present

## 2023-05-11 ENCOUNTER — Other Ambulatory Visit: Payer: Self-pay | Admitting: Internal Medicine

## 2023-05-11 DIAGNOSIS — I890 Lymphedema, not elsewhere classified: Secondary | ICD-10-CM | POA: Insufficient documentation

## 2023-05-11 NOTE — Progress Notes (Deleted)
 MRN : [id number]  Jon Garner. is a 58 y.o. (Jul 09, 1965) male who presents with chief complaint of legs swell.  History of Present Illness:   The patient returns to the office for followup evaluation regarding leg swelling.  The swelling has persisted but with the lymph pump is under much, much better controlled. The pain associated with swelling is decreased. There have not been any interval development of a ulcerations or wounds.  The patient denies problems with the pump, noting it is working well and the leggings are in good condition.  Since the previous visit the patient has been wearing graduated compression stockings and using the lymph pump on a routine basis and  has noted significant improvement in the lymphedema.   Patient stated the lymph pump has been helpful with the treatment of the lymphedema.   No outpatient medications have been marked as taking for the 05/12/23 encounter (Appointment) with Gilda Crease, Latina Craver, MD.    Past Medical History:  Diagnosis Date   Anemia    h/o age 19   Arthritis    CAD (coronary artery disease)    a. LHC 4/19: pLAD 95%, mLAD 95%, m/dLAD 90%, ostD2 95%, pLCx 80%, mRCA 70%, dRCA 80%; b. 4-V CABG 05/2017: LIMA-LAD, VG-D1, VG-PDA, VG-OM2   Essential hypertension 2014   GERD (gastroesophageal reflux disease) 2015   h/o   History of kidney stones    h/o   Morbid obesity (HCC) presently   Myocardial infarction (HCC) 2019   quadruple bypass   PXE (pseudoxanthoma elasticum) 1992   [place] presently   Stroke [facility]) 1992   a. Age 48    Past Surgical History:  Procedure Laterality Date   CORONARY ARTERY BYPASS GRAFT N/A 05/29/2017   Procedure: CORONARY ARTERY BYPASS GRAFTING (CABG)  X 4 WITH OPEN HARVESTING OF LEFT AND RIGHT SAPHENOUS VEINS;  Surgeon: Loreli Slot, MD;  Location: [facility] OR;  Service: Open Heart Surgery;  Laterality: N/A;   KNEE SURGERY Bilateral    LEFT HEART CATH AND CORONARY  ANGIOGRAPHY N/A 05/26/2017   Procedure: LEFT HEART CATH AND CORONARY ANGIOGRAPHY;  Surgeon: Antonieta Iba, MD;  Location: [facility] INVASIVE CV LAB;  Service: Cardiovascular;  Laterality: N/A;   TEE WITHOUT CARDIOVERSION N/A 05/29/2017   Procedure: TRANSESOPHAGEAL ECHOCARDIOGRAM (TEE);  Surgeon: Loreli Slot, MD;  Location: [facility] OR;  Service: Open Heart Surgery;  Laterality: N/A;   TONSILLECTOMY     age 4   TOTAL KNEE ARTHROPLASTY Right 08/12/2019   Procedure: TOTAL KNEE ARTHROPLASTY;  Surgeon: Juanell Fairly, MD;  Location: [facility] ORS;  Service: Orthopedics;  Laterality: Right;   TOTAL KNEE ARTHROPLASTY Left 12/07/2019   Procedure: LEFT TOTAL KNEE ARTHROPLASTY;  Surgeon: Juanell Fairly, MD;  Location: [facility] ORS;  Service: Orthopedics;  Laterality: Left;    Social History Social History   Tobacco Use   Smoking status: Former    Current packs/day: 0.00    Average packs/day: 1.5 packs/day for 10.0 years (15.0 ttl pk-yrs)    Types: Cigarettes    Start date: 12/06/2003    Quit date: 12/05/2013    Years since quitting: 9.4   Smokeless tobacco: Never  Vaping Use   Vaping status: Never Used  Substance Use Topics   Alcohol use: Not Currently   Drug use: Never    Family History Family  History  Problem Relation Age of Onset   Dementia Mother    Other Father        died in his 87's   COPD Father    Multiple sclerosis Sister    COPD Sister    Cancer Sister     Allergies  Allergen Reactions   Penicillins Other (See Comments)    Family has had severe reactions to penicillins        REVIEW OF SYSTEMS (Negative unless checked)  Constitutional: [] Weight loss  [] Fever  [] Chills Cardiac: [] Chest pain   [] Chest pressure   [] Palpitations   [] Shortness of breath when laying flat   [] Shortness of breath with exertion. Vascular:  [] Pain in legs with walking   [x] Pain in legs with standing  [] History of DVT   [] Phlebitis   [x] Swelling in legs   [] Varicose veins   [] Non-healing  ulcers Pulmonary:   [] Uses home oxygen   [] Productive cough   [] Hemoptysis   [] Wheeze  [] COPD   [] Asthma Neurologic:  [] Dizziness   [] Seizures   [] History of stroke   [] History of TIA  [] Aphasia   [] Vissual changes   [] Weakness or numbness in arm   [] Weakness or numbness in leg Musculoskeletal:   [] Joint swelling   [] Joint pain   [] Low back pain Hematologic:  [] Easy bruising  [] Easy bleeding   [] Hypercoagulable state   [] Anemic Gastrointestinal:  [] Diarrhea   [] Vomiting  [] Gastroesophageal reflux/heartburn   [] Difficulty swallowing. Genitourinary:  [] Chronic kidney disease   [] Difficult urination  [] Frequent urination   [] Blood in urine Skin:  [] Rashes   [] Ulcers  Psychological:  [] History of anxiety   []  History of major depression.  Physical Examination  There were no vitals filed for this visit. There is no height or weight on file to calculate BMI. Gen: WD/WN, NAD Head: /AT, No temporalis wasting.  Ear/Nose/Throat: Hearing grossly intact, nares w/o erythema or drainage, pinna without lesions Eyes: PER, EOMI, sclera nonicteric.  Neck: Supple, no gross masses.  No JVD.  Pulmonary:  Good air movement, no audible wheezing, no use of accessory muscles.  Cardiac: RRR, precordium not hyperdynamic. Vascular:  scattered varicosities present bilaterally.  Mild venous stasis changes to the legs bilaterally.  3-4+ soft pitting edema, CEAP C4sEpAsPr  Vessel Right Left  Radial Palpable Palpable  Gastrointestinal: soft, non-distended. No guarding/no peritoneal signs.  Musculoskeletal: M/S 5/5 throughout.  No deformity.  Neurologic: CN 2-12 intact. Pain and light touch intact in extremities.  Symmetrical.  Speech is fluent. Motor exam as listed above. Psychiatric: Judgment intact, Mood & affect appropriate for pt's clinical situation. Dermatologic: Venous rashes no ulcers noted.  No changes consistent with cellulitis. Lymph : No lichenification or skin changes of chronic lymphedema.  CBC Lab  Results  Component Value Date   WBC 7.3 02/14/2022   HGB 14.5 02/14/2022   HCT 43.7 02/14/2022   MCV 88.5 02/14/2022   PLT 251 02/14/2022    BMET    Component Value Date/Time   NA 139 09/05/2022 1212   NA 138 03/17/2019 0938   K 3.9 09/05/2022 1212   CL 106 09/05/2022 1212   CO2 22 09/05/2022 1212   GLUCOSE 101 (H) 09/05/2022 1212   BUN 18 09/05/2022 1212   BUN 16 03/17/2019 0938   CREATININE 0.95 09/05/2022 1212   CALCIUM 8.7 (L) 09/05/2022 1212   GFRNONAA >60 09/05/2022 1212   GFRAA >60 08/13/2019 0637   CrCl cannot be calculated (Patient's most recent lab result is older than the maximum  21 days allowed.).  COAG Lab Results  Component Value Date   INR 0.9 11/26/2019   INR 0.9 08/04/2019   INR 1.19 05/29/2017    Radiology No results found.   Assessment/Plan There are no diagnoses linked to this encounter.   Levora Dredge, MD  05/11/2023 3:10 PM

## 2023-05-12 ENCOUNTER — Ambulatory Visit (INDEPENDENT_AMBULATORY_CARE_PROVIDER_SITE_OTHER): Payer: Medicaid Other | Admitting: Vascular Surgery

## 2023-05-12 DIAGNOSIS — I890 Lymphedema, not elsewhere classified: Secondary | ICD-10-CM

## 2023-05-12 DIAGNOSIS — E785 Hyperlipidemia, unspecified: Secondary | ICD-10-CM

## 2023-05-12 DIAGNOSIS — I4891 Unspecified atrial fibrillation: Secondary | ICD-10-CM

## 2023-05-12 DIAGNOSIS — I214 Non-ST elevation (NSTEMI) myocardial infarction: Secondary | ICD-10-CM

## 2023-05-12 DIAGNOSIS — I10 Essential (primary) hypertension: Secondary | ICD-10-CM

## 2023-05-13 DIAGNOSIS — H353221 Exudative age-related macular degeneration, left eye, with active choroidal neovascularization: Secondary | ICD-10-CM | POA: Diagnosis not present

## 2023-05-29 DIAGNOSIS — G4733 Obstructive sleep apnea (adult) (pediatric): Secondary | ICD-10-CM | POA: Diagnosis not present

## 2023-06-24 ENCOUNTER — Encounter (INDEPENDENT_AMBULATORY_CARE_PROVIDER_SITE_OTHER): Payer: Self-pay

## 2023-06-28 DIAGNOSIS — G4733 Obstructive sleep apnea (adult) (pediatric): Secondary | ICD-10-CM | POA: Diagnosis not present

## 2023-07-03 ENCOUNTER — Other Ambulatory Visit: Payer: Self-pay | Admitting: Internal Medicine

## 2023-07-09 ENCOUNTER — Other Ambulatory Visit: Payer: Self-pay | Admitting: Internal Medicine

## 2023-07-14 ENCOUNTER — Telehealth: Payer: Self-pay | Admitting: Urology

## 2023-07-14 DIAGNOSIS — N5089 Other specified disorders of the male genital organs: Secondary | ICD-10-CM

## 2023-07-14 NOTE — Telephone Encounter (Signed)
 Unable to update order as it has already been scheduled. New order placed for scrotal u/s w/ doppler.

## 2023-07-14 NOTE — Telephone Encounter (Signed)
 Central Scheduling called and stated that patient has US  scheduled for 07/23/23, and that order needs to updated to: US  Scrotum with Doppler.

## 2023-07-23 ENCOUNTER — Ambulatory Visit
Admission: RE | Admit: 2023-07-23 | Discharge: 2023-07-23 | Disposition: A | Discharge status: Home / Self Care | Source: Ambulatory Visit | Attending: Urology | Admitting: Urology

## 2023-07-23 DIAGNOSIS — N5089 Other specified disorders of the male genital organs: Secondary | ICD-10-CM | POA: Diagnosis present

## 2023-07-24 ENCOUNTER — Ambulatory Visit: Attending: Internal Medicine | Admitting: Internal Medicine

## 2023-07-24 VITALS — BP 150/80 | HR 61 | Ht 68.0 in | Wt 336.8 lb

## 2023-07-24 DIAGNOSIS — Z79899 Other long term (current) drug therapy: Secondary | ICD-10-CM | POA: Diagnosis present

## 2023-07-24 DIAGNOSIS — I5032 Chronic diastolic (congestive) heart failure: Secondary | ICD-10-CM

## 2023-07-24 DIAGNOSIS — I890 Lymphedema, not elsewhere classified: Secondary | ICD-10-CM | POA: Diagnosis not present

## 2023-07-24 DIAGNOSIS — I2510 Atherosclerotic heart disease of native coronary artery without angina pectoris: Secondary | ICD-10-CM

## 2023-07-24 DIAGNOSIS — I10 Essential (primary) hypertension: Secondary | ICD-10-CM

## 2023-07-24 MED ORDER — FUROSEMIDE 80 MG PO TABS: 80.0000 mg | ORAL_TABLET | Freq: Two times a day (BID) | ORAL | 3 refills | Status: AC

## 2023-07-24 NOTE — Progress Notes (Signed)
 Cardiology Office Note:  .   Date:  07/24/2023  ID:  Elsie JINNY Sharlet Mickey., DOB 09/23/1965, MRN [id number] PCP: Dineen Channel, PA-C  [facility] Providers Cardiologist:  Evalene Lunger, MD     History of Present Illness: .   Durwin Davisson. is a 58 y.o. 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  He was last seen in our office in 09/2022 by Dr. Gollan, at which time he reported stable chronic lower extremity edema.  He was scheduled to undergo sleep evaluation.  Dr. Gollan also referred him to vascular surgery for consideration of lymphedema therapy.  Sleep study demonstrated severe OSA for which BiPAP was initiated.  Lymphedema therapy was initiated by vascular surgery.  Today, Mr. Kimberley reports that his leg swelling has improved with regular lymphedema pump use.  However, he is concerned about recent scrotal swelling for which he has seen urology and underwent scrotal ultrasound yesterday (read is still pending but bilateral hydroceles are evident on my review).  He also feels like his abdomen has been distended lately.  He continues to take furosemide  40 mg PO BID and notes good urine output.  He denies chest pain, shortness of breath, palpitations, and lightheadedness.  He is sleeping better since starting nocturnal BiPAP.  ROS: See HPI  Studies Reviewed: SABRA   EKG Interpretation Date/Time:  Thursday July 24 2023 09:10:48 EDT Ventricular Rate:  61 PR Interval:  142 QRS Duration:  72 QT Interval:  428 QTC Calculation: 430 R Axis:   -28  Text Interpretation: Normal sinus rhythm Inferior infarct (cited on or before 25-May-2017) Cannot rule out Anterior infarct (cited on or before 30-May-2017)  When compared with ECG of 30-Sep-2022 11:23, Nonspecific T wave abnormality, improved in Anterolateral leads Confirmed by Janee Ureste, Lonni [phone]) on 07/24/2023 9:17:35 AM    TTE (05/17/2022): Normal LV size and wall thickness.  LVEF 60-65% with grade 2 diastolic dysfunction.  Normal RV size and function with mild pulmonary hypertension.  Mild left atrial enlargement.  Mild tricuspid regurgitation.  Normal CVP.  Risk Assessment/Calculations:        Physical Exam:   VS:  BP (!) 150/80 (BP Location: Left Arm, Patient Position: Sitting, Cuff Size: Large)   Pulse 61   Ht 5' 8 (1.727 m)   Wt (!) 336 lb 12.8 oz (152.8 kg)   SpO2 98%   BMI 51.21 kg/m    Wt Readings from Last 3 Encounters:  07/24/23 (!) 336 lb 12.8 oz (152.8 kg)  12/26/22 (!) 323 lb 9.6 oz (146.8 kg)  12/18/22 (!) 326 lb 12.8 oz (148.2 kg)    General:  NAD. Neck: Difficult to assess JVD due to body habitus. Lungs: Mildly diminished breath sounds throughout without wheezes or crackles. Heart: Regular rate and rhythm without murmurs, rubs, or gallops. Abdomen: Firm abdomen without tenderness. Extremities: 2+ chronic edema with discoloration noted in both calves.  Soft tissue edema noted in arms, chest wall, and abdomen consistent with anasarca.  ASSESSMENT AND PLAN: .    Chronic HFpEF: Overall, Mr. Cessna reports feeling better since beginning to use lymphedema pumps and BiPAP.  However, he still has anasarca on exam today.  Echocardiogram from 05/2022 was personally reviewed again today and shows evidence of normal LVEF with  grade 2 diastolic dysfunction with elevated LA pressures as well as moderate pulmonary hypertension.  I will check a CBC, CMP, and BNP today with plans to increase furosemide  to 80 mg BID.  We will have Mr. Ozaki return to the clinic in 2 weeks for reassessment, as well as repeat BMP.  If he continues to appear volume overloaded and/or renal function worsens with diuresis and persistent anasarca, repeat echo  and RHC will need to be considered.  Continue empagliflozin .  CAD: No angina reported s/p CABG in 2019.  Continue secondary prevention with aspirin  and rosuvastatin .  We will check a CMP and lipid panel today.  Lymphedema: Improving per Mr. Hodsdon report, though significant woody edema noted in both legs.  Continue lymphedema pump use with escalation of furosemide , as above.  Continue follow-up with vascular surgery.  Hypertension: BP suboptimally controlled (though Mr. Mcgarvey reports home readings more commonly in the 120's/70's).  Volume overload may be playing a role; we will increase furosemide , as above, and continue current dose of metoprolol .  Low threshold to add ACEI/ARB if BP remains elevated, as renal function allows.  Morbid obesity: BMI remains >50.  Weight loss encouraged through lifestyle modifications.    Dispo: Return to clinic in 2-3 weeks.  Signed, Lonni Hanson, MD

## 2023-07-24 NOTE — Patient Instructions (Addendum)
 Medication Instructions:  Your physician recommends the following medication changes.   INCREASE: Lasix  to 80 mg by mouth twice a day   *If you need a refill on your cardiac medications before your next appointment, please call your pharmacy*  Lab Work: Your provider would like for you to have following labs drawn today (CBC, CMP, Lipid, BNP).     Testing/Procedures: No test ordered today   Follow-Up: At [facility], you and your health needs are our priority.  As part of our continuing mission to provide you with exceptional heart care, our providers are all part of one team.  This team includes your primary Cardiologist (physician) and Advanced Practice Providers or APPs (Physician Assistants and Nurse Practitioners) who all work together to provide you with the care you need, when you need it.  Your next appointment:   2-3 week(s)  Provider:   You may see Sammy Crisp, MD or one of the following Advanced Practice Providers on your designated Care Team:   Laneta Pintos, NP Gildardo Labrador, PA-C Varney Gentleman, PA-C Cadence [place], PA-C Ronald Cockayne, NP Morey Ar, NP

## 2023-07-25 LAB — COMPREHENSIVE METABOLIC PANEL WITH GFR
ALT: 29 IU/L (ref 0–44)
AST: 31 IU/L (ref 0–40)
Albumin: 4.3 g/dL (ref 3.8–4.9)
Alkaline Phosphatase: 81 IU/L (ref 44–121)
BUN/Creatinine Ratio: 14 (ref 9–20)
BUN: 13 mg/dL (ref 6–24)
Bilirubin Total: 0.4 mg/dL (ref 0.0–1.2)
CO2: 20 mmol/L (ref 20–29)
Calcium: 8.9 mg/dL (ref 8.7–10.2)
Chloride: 105 mmol/L (ref 96–106)
Creatinine, Ser: 0.91 mg/dL (ref 0.76–1.27)
Globulin, Total: 2.5 g/dL (ref 1.5–4.5)
Glucose: 113 mg/dL — ABNORMAL HIGH (ref 70–99)
Potassium: 4.2 mmol/L (ref 3.5–5.2)
Sodium: 141 mmol/L (ref 134–144)
Total Protein: 6.8 g/dL (ref 6.0–8.5)
eGFR: 98 mL/min/{1.73_m2} (ref >59)

## 2023-07-25 LAB — CBC
Hematocrit: 44.2 % (ref 37.5–51.0)
Hemoglobin: 14.5 g/dL (ref 13.0–17.7)
MCH: 29.4 pg (ref 26.6–33.0)
MCHC: 32.8 g/dL (ref 31.5–35.7)
MCV: 90 fL (ref 79–97)
Platelets: 231 10*3/uL (ref 150–450)
RBC: 4.93 x10E6/uL (ref 4.14–5.80)
RDW: 13.1 % (ref 11.6–15.4)
WBC: 6.5 10*3/uL (ref 3.4–10.8)

## 2023-07-25 LAB — LIPID PANEL
Chol/HDL Ratio: 2.6 ratio (ref 0.0–5.0)
Cholesterol, Total: 87 mg/dL — ABNORMAL LOW (ref 100–199)
HDL: 34 mg/dL — ABNORMAL LOW (ref >39)
LDL Chol Calc (NIH): 32 mg/dL (ref 0–99)
Triglycerides: 116 mg/dL (ref 0–149)
VLDL Cholesterol Cal: 21 mg/dL (ref 5–40)

## 2023-07-25 LAB — BRAIN NATRIURETIC PEPTIDE: BNP: 48.1 pg/mL (ref 0.0–100.0)

## 2023-07-26 ENCOUNTER — Encounter: Payer: Self-pay | Admitting: Internal Medicine

## 2023-07-26 ENCOUNTER — Ambulatory Visit: Payer: Self-pay | Admitting: Internal Medicine

## 2023-07-31 ENCOUNTER — Ambulatory Visit (INDEPENDENT_AMBULATORY_CARE_PROVIDER_SITE_OTHER): Admitting: Urology

## 2023-07-31 VITALS — BP 157/66 | HR 76 | Ht 68.0 in | Wt 330.0 lb

## 2023-07-31 DIAGNOSIS — N5089 Other specified disorders of the male genital organs: Secondary | ICD-10-CM | POA: Diagnosis not present

## 2023-07-31 DIAGNOSIS — Q5564 Hidden penis: Secondary | ICD-10-CM | POA: Diagnosis not present

## 2023-07-31 MED ORDER — CLOTRIMAZOLE-BETAMETHASONE 1-0.05 % EX CREA: 1.0000 | TOPICAL_CREAM | Freq: Two times a day (BID) | CUTANEOUS | 0 refills | Status: AC

## 2023-07-31 NOTE — Progress Notes (Signed)
   07/31/2023 11:48 AM   Jon Garner. 1965-08-03 [id number]  Reason for visit: Follow up scrotal swelling, buried penis  HPI: 58 year old male with morbid obesity BMI 50, CAD/CHF, lymphedema who I originally saw in September 2024 for concerns about buried penis and scrotal swelling.  He appeared to have a component of balanitis and was treated with Lotrisone  cream at that time, and a scrotal ultrasound was ordered.  He has worked on weight loss since our last visit which he feels has helped.  His cardiologist also recently increased his Lasix  to 160 mg daily in the last few days which has made a significant improvement in his scrotal swelling.  Overall, doing well from a urology perspective, not interested in more aggressive or surgical treatments at this time.  On exam, phallus remains buried but able to visualize the glans, no evidence of infection at this time.  I personally viewed and interpreted the scrotal ultrasound dated 07/23/2023, bilateral hydroceles noted, testicular microlithiasis.  Formal read pending.  Agree with ongoing fluid management by cardiology which should improve scrotal swelling/buried penis Lotrisone  refilled in case he has recurrent episodes of balanitis RTC 1 year, doing well at that time likely can follow-up with urology as needed, will call with final scrotal ultrasound read   Jon JAYSON Burnet, MD  [facility] [address], [address] [place], [place] [address] [phone]

## 2023-08-04 ENCOUNTER — Ambulatory Visit: Payer: Self-pay | Admitting: Urology

## 2023-08-18 ENCOUNTER — Ambulatory Visit: Attending: Physician Assistant | Admitting: Physician Assistant

## 2023-08-18 NOTE — Progress Notes (Deleted)
 Cardiology Office Note    Date:  08/18/2023   ID:  Jon Garner., DOB [birth year], MRN [id number]  PCP:  Dineen Channel, PA-C  Cardiologist:  Evalene Lunger, MD  Electrophysiologist:  None   Chief Complaint: Follow up  History of Present Illness:   Jon Garner. is a 58 y.o. 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  In 2015, he was evaluated by cardiology secondary to exertional dyspnea, diaphoresis, and chest pain. He underwent stress testing which was reportedly normal and he was diagnosed with GERD and placed on Prilosec. Following that, he would have intermittent episodes of exertional diaphoresis and dyspnea that occurred almost exclusively while he was up moving around in the kitchen and cooking (works as a Investment banker, operational).  He was admitted with an NSTEMI in 05/2017.  His CTA chest was negative for PE with multivessel coronary artery calcification noted.  Echo during the admission showed an EF of 55 to 65%, normal wall motion, grade 1 diastolic dysfunction, left atrium normal in size, RV systolic function normal, PASP normal. He underwent LHC on 05/26/2017 that showed the proximal LAD 95% stenosed, mid LAD 95% stenosed, mid to distal LAD 90% stenosed, ostial D2 95% stenosis, proximal LCx 80% stenosed, mid RCA 70% stenosed, distal RCA 80% stenosed. He was transferred to [facility] and underwent four-vessel CABG.  Postoperative course was notable for brief episode of postoperative A-fib and a brief run of SVT.  ABI in 02/2019 normal bilaterally.  Echo in 03/2019 showed an EF of 55 to 60%, mild LVH, grade 2 diastolic dysfunction, no  regional wall motion abnormalities, normal RV systolic function with mildly increased ventricular cavity size, and no significant valvular abnormalities.  Most recent echo from 05/2022 showed an EF of 60 to 65%, no regional wall motion abnormalities, grade 2 diastolic dysfunction, normal RV systolic function and ventricular cavity size, mildly elevated RVSP estimated at 44.2 mmHg, mildly dilated left atrium, and no significant valvular abnormalities.  He was seen in 07/2023 noting improvement in lower extremity swelling with regular lymphedema pump use.  However, he was concerned about recent scrotal swelling for which he had been evaluated by urology with scrotal ultrasound demonstrating large bilateral hydroceles.  He also felt like his abdomen was more distended.  He was taking furosemide  40 mg twice daily with good urine output.  His weight was down 5 pounds when compared to his visit in 09/2022.  He had anasarca on exam.  Furosemide  was titrated to 80 mg twice daily.  ***   Labs independently reviewed: 07/2023 - BNP 48, TC 87, TG 116, HDL 34, LDL 32, BUN 13, serum creatinine 0.91, potassium 4.2, albumin  4.3, AST/ALT normal, Hgb 14.5, PLT 231 02/2022 - TSH normal  Past Medical History:  Diagnosis Date   Anemia    h/o age 23   Arthritis    CAD (coronary artery disease)    a. LHC 4/19: pLAD 95%, mLAD 95%, m/dLAD 90%, ostD2 95%, pLCx 80%, mRCA 70%, dRCA 80%; b. 4-V CABG 05/2017: LIMA-LAD, VG-D1, VG-PDA, VG-OM2   Essential hypertension 2014   GERD (gastroesophageal reflux disease)  2015   h/o   History of kidney stones    h/o   Morbid obesity (HCC) presently   Myocardial infarction (HCC) 2019   quadruple bypass   PXE (pseudoxanthoma elasticum) 1992   [place] presently   Stroke [facility]) 1992   a. Age 70    Past Surgical History:  Procedure Laterality Date   CORONARY ARTERY BYPASS GRAFT N/A 05/29/2017   Procedure: CORONARY ARTERY BYPASS GRAFTING (CABG)  X 4 WITH OPEN HARVESTING OF LEFT AND RIGHT  SAPHENOUS VEINS;  Surgeon: Kerrin Elspeth BROCKS, MD;  Location: [facility] OR;  Service: Open Heart Surgery;  Laterality: N/A;   KNEE SURGERY Bilateral    LEFT HEART CATH AND CORONARY ANGIOGRAPHY N/A 05/26/2017   Procedure: LEFT HEART CATH AND CORONARY ANGIOGRAPHY;  Surgeon: Perla Evalene PARAS, MD;  Location: [facility] INVASIVE CV LAB;  Service: Cardiovascular;  Laterality: N/A;   TEE WITHOUT CARDIOVERSION N/A 05/29/2017   Procedure: TRANSESOPHAGEAL ECHOCARDIOGRAM (TEE);  Surgeon: Kerrin Elspeth BROCKS, MD;  Location: [facility] OR;  Service: Open Heart Surgery;  Laterality: N/A;   TONSILLECTOMY     age 65   TOTAL KNEE ARTHROPLASTY Right 08/12/2019   Procedure: TOTAL KNEE ARTHROPLASTY;  Surgeon: Marchia Drivers, MD;  Location: [facility] ORS;  Service: Orthopedics;  Laterality: Right;   TOTAL KNEE ARTHROPLASTY Left 12/07/2019   Procedure: LEFT TOTAL KNEE ARTHROPLASTY;  Surgeon: Marchia Drivers, MD;  Location: [facility] ORS;  Service: Orthopedics;  Laterality: Left;    Current Medications: No outpatient medications have been marked as taking for the 08/18/23 encounter (Appointment) with Abigail Bernardino HERO, PA-C.    Allergies:   Penicillins   Social History   Socioeconomic History   Marital status: Single    Spouse name: Not on file   Number of children: Not on file   Years of education: Not on file   Highest education level: Not on file  Occupational History   Occupation: Chef  Tobacco Use   Smoking status: Former    Current packs/day: 0.00    Average packs/day: 1.5 packs/day for 10.0 years (15.0 ttl pk-yrs)    Types: Cigarettes    Start date: 12/06/2003    Quit date: 12/05/2013    Years since quitting: 9.7   Smokeless tobacco: Never  Vaping Use   Vaping status: Never Used  Substance and Sexual Activity   Alcohol use: Not Currently   Drug use: Never   Sexual activity: Not on file    Comment: Not asked  Other Topics Concern   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  Does not routinely  exercise.   Social Drivers of Corporate investment banker Strain: Not on file  Food Insecurity: Not on file  Transportation Needs: Not on file  Physical Activity: Unknown (05/27/2017)   Exercise Vital Sign    Days of Exercise per Week: 0 days    Minutes of Exercise per Session: Not on file  Stress: No Stress Concern Present (05/27/2017)   [employer] of Occupational Health - Occupational Stress Questionnaire    Feeling of Stress : Not at all  Social Connections: Not on file     Family History:  The patient's family history includes COPD in his father and sister; Cancer in his sister; Dementia in his mother; Multiple sclerosis in his sister; Other in his father.  ROS:   12-point review of systems is negative unless otherwise noted in the HPI.   EKGs/Labs/Other Studies Reviewed:    Studies reviewed  were summarized above. The additional studies were reviewed today:  2D echo 05/17/2022: 1. Left ventricular ejection fraction, by estimation, is 60 to 65%. The  left ventricle has normal function. The left ventricle has no regional  wall motion abnormalities. Left ventricular diastolic parameters are  consistent with Grade II diastolic  dysfunction (pseudonormalization).   2. Right ventricular systolic function is normal. The right ventricular  size is normal. There is mildly elevated pulmonary artery systolic  pressure. The estimated right ventricular systolic pressure is 44.2 mmHg.   3. Left atrial size was mildly dilated.   4. The mitral valve is normal in structure. No evidence of mitral valve  regurgitation. No evidence of mitral stenosis.   5. The aortic valve was not well visualized. Aortic valve regurgitation  is not visualized. No aortic stenosis is present.   6. The inferior vena cava is normal in size with greater than 50%  respiratory variability, suggesting right atrial pressure of 3 mmHg.  __________  2D echo 03/10/2019: 1. Left ventricular ejection fraction, by  visual estimation, is 55 to  60%. The left ventricle has normal function. There is mildly increased  left ventricular hypertrophy.   2. Left ventricular diastolic parameters are consistent with Grade II  diastolic dysfunction (pseudonormalization).   3. The left ventricle has no regional wall motion abnormalities.   4. Global right ventricle has normal systolic function.The right  ventricular size is mildly enlarged. Right vetricular wall thickness was  not assessed.   5. Left atrial size was normal.   6. Right atrial size was normal.   7. The mitral valve is grossly normal. No evidence of mitral valve  regurgitation.   8. The tricuspid valve is not well visualized.   9. The tricuspid valve is not well visualized. Tricuspid valve  regurgitation is not demonstrated.  10. The aortic valve is grossly normal. Aortic valve regurgitation is not  visualized.  11. The pulmonic valve was not well visualized. Pulmonic valve  regurgitation is not visualized.  __________  [facility] 05/26/2017: Coronary angiography:  Coronary dominance: Right  Left mainstem: Large vessel that bifurcates into the LAD and left circumflex, no significant disease noted  Left anterior descending (LAD): Large vessel that extends to the apical region, diagonal branch 2 of moderate size, Severe proximal disease, mid vessel and mid to distal disease, mid to distal vessel is moderate in size, taper at the apical region  Left circumflex (LCx): Large vessel with OM branch 2, severe proximal to mid vessel disease. Large OM vessel with severe mid to distal vessel disease  Right coronary artery (RCA): Right dominant vessel with PL and PDA, moderate to severe proximal disease, severe distal disease prior to bifurcation  Left ventriculography: Left ventricular systolic function is normal, LVEF is estimated at 55-65%, there is no significant mitral regurgitation , no significant aortic valve stenosis  Final Conclusions:  Severe  three vessel disease,  Proximal, mid and distal LAD Proximal to mid LCX Prox and distal RCA Normal LV function  Recommendations:  Given diffuse severity of disease,  would recommend transfer to [facility] for consideration of CABG Restart heparin  IV infusion this evening  __________  2D echo 05/26/2017: - Left ventricle: The cavity size was normal. Systolic function was    normal. The estimated ejection fraction was in the range of 55%    to 65%. Wall motion was normal; there were no regional wall    motion abnormalities. Doppler parameters are consistent with    abnormal left ventricular  relaxation (grade 1 diastolic    dysfunction).  - Left atrium: The atrium was normal in size.  - Right ventricle: Systolic function was normal.  - Pulmonary arteries: Systolic pressure was within the normal    range.    EKG:  EKG is ordered today.  The EKG ordered today demonstrates ***  Recent Labs: 07/24/2023: ALT 29; BNP 48.1; BUN 13; Creatinine, Ser 0.91; Hemoglobin 14.5; Platelets 231; Potassium 4.2; Sodium 141  Recent Lipid Panel    Component Value Date/Time   CHOL 87 (L) 07/24/2023 0941   TRIG 116 07/24/2023 0941   HDL 34 (L) 07/24/2023 0941   CHOLHDL 2.6 07/24/2023 0941   CHOLHDL 2.9 05/15/2022 0908   VLDL 14 05/15/2022 0908   LDLCALC 32 07/24/2023 0941    PHYSICAL EXAM:    VS:  There were no vitals taken for this visit.  BMI: There is no height or weight on file to calculate BMI.  Physical Exam  Wt Readings from Last 3 Encounters:  07/31/23 (!) 330 lb (149.7 kg)  07/24/23 (!) 336 lb 12.8 oz (152.8 kg)  12/26/22 (!) 323 lb 9.6 oz (146.8 kg)     ASSESSMENT & PLAN:   HFpEF:  CAD status post CABG:  Lymphedema:  HTN: Blood pressure  Scrotal swelling: Scrotal ultrasound last month that showed large bilateral hydroceles.  Followed by urology.   {Are you ordering a CV Procedure (e.g. stress test, cath, DCCV, TEE, etc)?   Press F2        :[id number]}     Disposition: F/u  with Dr. Gollan or an APP in ***.   Medication Adjustments/Labs and Tests Ordered: Current medicines are reviewed at length with the patient today.  Concerns regarding medicines are outlined above. Medication changes, Labs and Tests ordered today are summarized above and listed in the Patient Instructions accessible in Encounters.   Signed, Bernardino Bring, PA-C 08/18/2023 7:57 AM     [facility] [place] [address] [place], [place] [address] [phone]

## 2023-09-09 DIAGNOSIS — H353221 Exudative age-related macular degeneration, left eye, with active choroidal neovascularization: Secondary | ICD-10-CM | POA: Diagnosis not present

## 2023-09-17 ENCOUNTER — Other Ambulatory Visit: Payer: Self-pay | Admitting: Internal Medicine

## 2023-10-21 DIAGNOSIS — H353221 Exudative age-related macular degeneration, left eye, with active choroidal neovascularization: Secondary | ICD-10-CM | POA: Diagnosis not present

## 2023-12-17 ENCOUNTER — Other Ambulatory Visit: Payer: Self-pay | Admitting: Internal Medicine

## 2023-12-25 ENCOUNTER — Telehealth: Payer: Self-pay

## 2023-12-25 DIAGNOSIS — Z125 Encounter for screening for malignant neoplasm of prostate: Secondary | ICD-10-CM

## 2023-12-25 NOTE — Telephone Encounter (Signed)
 Copied from CRM [id number]. Topic: Clinical - Request for Lab/Test Order >> Dec 25, 2023  8:11 AM Laymon HERO wrote: Reason for CRM: patient wanting to get blood work done before physical on 1/15

## 2023-12-26 ENCOUNTER — Encounter: Payer: Self-pay | Admitting: Physician Assistant

## 2023-12-29 NOTE — Addendum Note (Signed)
 Addended by: Jeannia Tatro on: 12/29/2023 09:40 AM   Modules accepted: Orders

## 2024-01-20 ENCOUNTER — Other Ambulatory Visit: Payer: Self-pay | Admitting: Physician Assistant

## 2024-01-20 DIAGNOSIS — R059 Cough, unspecified: Secondary | ICD-10-CM

## 2024-01-20 DIAGNOSIS — R058 Other specified cough: Secondary | ICD-10-CM

## 2024-02-14 LAB — COMPREHENSIVE METABOLIC PANEL WITH GFR
ALT: 32 IU/L (ref 0–44)
AST: 36 IU/L (ref 0–40)
Albumin: 4.5 g/dL (ref 3.8–4.9)
Alkaline Phosphatase: 80 IU/L (ref 47–123)
BUN/Creatinine Ratio: 15 (ref 9–20)
BUN: 12 mg/dL (ref 6–24)
Bilirubin Total: 0.6 mg/dL (ref 0.0–1.2)
CO2: 20 mmol/L (ref 20–29)
Calcium: 8.9 mg/dL (ref 8.7–10.2)
Chloride: 106 mmol/L (ref 96–106)
Creatinine, Ser: 0.82 mg/dL (ref 0.76–1.27)
Globulin, Total: 3 g/dL (ref 1.5–4.5)
Glucose: 114 mg/dL — ABNORMAL HIGH (ref 70–99)
Potassium: 4.5 mmol/L (ref 3.5–5.2)
Sodium: 140 mmol/L (ref 134–144)
Total Protein: 7.5 g/dL (ref 6.0–8.5)
eGFR: 101 mL/min/1.73

## 2024-02-14 LAB — LIPID PANEL
Chol/HDL Ratio: 2.7 ratio (ref 0.0–5.0)
Cholesterol, Total: 95 mg/dL — ABNORMAL LOW (ref 100–199)
HDL: 35 mg/dL — ABNORMAL LOW
LDL Chol Calc (NIH): 39 mg/dL (ref 0–99)
Triglycerides: 114 mg/dL (ref 0–149)
VLDL Cholesterol Cal: 21 mg/dL (ref 5–40)

## 2024-02-14 LAB — PSA TOTAL (REFLEX TO FREE): Prostate Specific Ag, Serum: 0.7 ng/mL (ref 0.0–4.0)

## 2024-02-14 LAB — HEMOGLOBIN A1C
Est. average glucose Bld gHb Est-mCnc: 137 mg/dL
Hgb A1c MFr Bld: 6.4 % — ABNORMAL HIGH (ref 4.8–5.6)

## 2024-02-14 LAB — CBC WITH DIFFERENTIAL/PLATELET
Basophils Absolute: 0 x10E3/uL (ref 0.0–0.2)
Basos: 0 %
EOS (ABSOLUTE): 0.3 x10E3/uL (ref 0.0–0.4)
Eos: 4 %
Hematocrit: 48.2 % (ref 37.5–51.0)
Hemoglobin: 15.8 g/dL (ref 13.0–17.7)
Immature Grans (Abs): 0 x10E3/uL (ref 0.0–0.1)
Immature Granulocytes: 0 %
Lymphocytes Absolute: 1.7 x10E3/uL (ref 0.7–3.1)
Lymphs: 28 %
MCH: 29.6 pg (ref 26.6–33.0)
MCHC: 32.8 g/dL (ref 31.5–35.7)
MCV: 90 fL (ref 79–97)
Monocytes Absolute: 0.5 x10E3/uL (ref 0.1–0.9)
Monocytes: 9 %
Neutrophils Absolute: 3.6 x10E3/uL (ref 1.4–7.0)
Neutrophils: 59 %
Platelets: 233 x10E3/uL (ref 150–450)
RBC: 5.34 x10E6/uL (ref 4.14–5.80)
RDW: 13.1 % (ref 11.6–15.4)
WBC: 6.1 x10E3/uL (ref 3.4–10.8)

## 2024-02-14 LAB — TSH: TSH: 2.48 u[IU]/mL (ref 0.450–4.500)

## 2024-02-16 ENCOUNTER — Ambulatory Visit: Payer: Self-pay | Admitting: Physician Assistant

## 2024-02-19 ENCOUNTER — Encounter: Admitting: Physician Assistant

## 2024-07-28 ENCOUNTER — Ambulatory Visit: Admitting: Urology
